# Patient Record
Sex: Female | Born: 1945
Health system: Southern US, Community
[De-identification: ages and names within clinical notes are randomized; demographics above are authoritative.]

## PROBLEM LIST (undated history)

## (undated) DIAGNOSIS — I10 Essential (primary) hypertension: Secondary | ICD-10-CM

## (undated) DIAGNOSIS — U071 COVID-19: Secondary | ICD-10-CM

## (undated) DIAGNOSIS — N6320 Unspecified lump in the left breast, unspecified quadrant: Secondary | ICD-10-CM

## (undated) DIAGNOSIS — K219 Gastro-esophageal reflux disease without esophagitis: Secondary | ICD-10-CM

## (undated) DIAGNOSIS — R000 Tachycardia, unspecified: Secondary | ICD-10-CM

## (undated) DIAGNOSIS — G43009 Migraine without aura, not intractable, without status migrainosus: Secondary | ICD-10-CM

## (undated) DIAGNOSIS — F419 Anxiety disorder, unspecified: Secondary | ICD-10-CM

## (undated) DIAGNOSIS — A080 Rotaviral enteritis: Secondary | ICD-10-CM

## (undated) DIAGNOSIS — K831 Obstruction of bile duct: Secondary | ICD-10-CM

## (undated) DIAGNOSIS — K8050 Calculus of bile duct without cholangitis or cholecystitis without obstruction: Secondary | ICD-10-CM

## (undated) HISTORY — PX: TUBAL LIGATION: SHX77

## (undated) HISTORY — PX: SKIN BIOPSY: SHX1

## (undated) HISTORY — PX: PARTIAL HYSTERECTOMY: SHX80

## (undated) HISTORY — DX: COVID-19: U07.1

## (undated) HISTORY — PX: WRIST SURGERY: SHX841

## (undated) HISTORY — DX: Rotaviral enteritis: A08.0

## (undated) HISTORY — PX: BREAST LUMPECTOMY: SHX2

## (undated) HISTORY — DX: Calculus of bile duct without cholangitis or cholecystitis without obstruction: K80.50

## (undated) HISTORY — PX: BLADDER SURGERY: SHX569

## (undated) HISTORY — DX: Migraine without aura, not intractable, without status migrainosus: G43.009

## (undated) HISTORY — PX: CHOLECYSTECTOMY: SHX55

## (undated) HISTORY — DX: Gastro-esophageal reflux disease without esophagitis: K21.9

## (undated) HISTORY — PX: COLONOSCOPY: SHX174

## (undated) HISTORY — DX: Obstruction of bile duct: K83.1

## (undated) HISTORY — PX: CYSTOSCOPY: SUR368

## (undated) HISTORY — PX: FOOT SURGERY: SHX648

## (undated) HISTORY — PX: ABDOMINAL HYSTERECTOMY: SHX81

## (undated) HISTORY — PX: VEIN LIGATION AND STRIPPING: SHX2653

---

## 1995-02-13 DIAGNOSIS — F32A Depression, unspecified: Secondary | ICD-10-CM

## 1995-02-13 HISTORY — DX: Depression, unspecified: F32.A

## 1998-07-21 ENCOUNTER — Encounter: Payer: Self-pay | Admitting: Gastroenterology

## 1998-07-22 ENCOUNTER — Inpatient Hospital Stay (HOSPITAL_COMMUNITY): Admission: RE | Admit: 1998-07-22 | Discharge: 1998-07-23 | Payer: Self-pay | Admitting: Gastroenterology

## 1998-08-18 ENCOUNTER — Emergency Department (HOSPITAL_COMMUNITY): Admission: EM | Admit: 1998-08-18 | Discharge: 1998-08-18 | Payer: Self-pay | Admitting: Emergency Medicine

## 1998-08-18 ENCOUNTER — Encounter: Payer: Self-pay | Admitting: Emergency Medicine

## 1999-04-11 ENCOUNTER — Encounter (INDEPENDENT_AMBULATORY_CARE_PROVIDER_SITE_OTHER): Payer: Self-pay | Admitting: Specialist

## 1999-04-11 ENCOUNTER — Inpatient Hospital Stay (HOSPITAL_COMMUNITY): Admission: RE | Admit: 1999-04-11 | Discharge: 1999-04-13 | Payer: Self-pay | Admitting: Obstetrics and Gynecology

## 1999-06-05 ENCOUNTER — Encounter: Payer: Self-pay | Admitting: Internal Medicine

## 1999-06-05 ENCOUNTER — Ambulatory Visit (HOSPITAL_COMMUNITY): Admission: RE | Admit: 1999-06-05 | Discharge: 1999-06-05 | Payer: Self-pay | Admitting: Internal Medicine

## 1999-12-19 ENCOUNTER — Other Ambulatory Visit: Admission: RE | Admit: 1999-12-19 | Discharge: 1999-12-19 | Payer: Self-pay | Admitting: Obstetrics and Gynecology

## 2000-04-15 ENCOUNTER — Encounter: Payer: Self-pay | Admitting: Family Medicine

## 2000-04-15 ENCOUNTER — Encounter: Admission: RE | Admit: 2000-04-15 | Discharge: 2000-04-15 | Payer: Self-pay | Admitting: Family Medicine

## 2000-04-16 ENCOUNTER — Inpatient Hospital Stay (HOSPITAL_COMMUNITY): Admission: EM | Admit: 2000-04-16 | Discharge: 2000-04-17 | Payer: Self-pay | Admitting: Internal Medicine

## 2000-04-16 ENCOUNTER — Encounter: Payer: Self-pay | Admitting: Internal Medicine

## 2000-12-29 ENCOUNTER — Encounter: Payer: Self-pay | Admitting: Critical Care Medicine

## 2000-12-29 ENCOUNTER — Ambulatory Visit (HOSPITAL_COMMUNITY): Admission: RE | Admit: 2000-12-29 | Discharge: 2000-12-29 | Payer: Self-pay | Admitting: Critical Care Medicine

## 2001-06-29 ENCOUNTER — Other Ambulatory Visit: Admission: RE | Admit: 2001-06-29 | Discharge: 2001-06-29 | Payer: Self-pay | Admitting: Obstetrics and Gynecology

## 2001-09-04 ENCOUNTER — Ambulatory Visit (HOSPITAL_COMMUNITY): Admission: RE | Admit: 2001-09-04 | Discharge: 2001-09-04 | Payer: Self-pay | Admitting: Internal Medicine

## 2001-09-16 HISTORY — PX: BREAST EXCISIONAL BIOPSY: SUR124

## 2001-10-19 ENCOUNTER — Encounter: Admission: RE | Admit: 2001-10-19 | Discharge: 2001-10-19 | Payer: Self-pay | Admitting: Occupational Medicine

## 2001-10-19 ENCOUNTER — Encounter: Payer: Self-pay | Admitting: Occupational Medicine

## 2002-03-21 ENCOUNTER — Emergency Department (HOSPITAL_COMMUNITY): Admission: EM | Admit: 2002-03-21 | Discharge: 2002-03-21 | Payer: Self-pay | Admitting: Emergency Medicine

## 2002-03-25 ENCOUNTER — Inpatient Hospital Stay (HOSPITAL_COMMUNITY): Admission: EM | Admit: 2002-03-25 | Discharge: 2002-03-27 | Payer: Self-pay | Admitting: Internal Medicine

## 2002-03-26 ENCOUNTER — Encounter: Payer: Self-pay | Admitting: Internal Medicine

## 2002-05-03 ENCOUNTER — Encounter (INDEPENDENT_AMBULATORY_CARE_PROVIDER_SITE_OTHER): Payer: Self-pay | Admitting: *Deleted

## 2002-05-03 ENCOUNTER — Encounter: Payer: Self-pay | Admitting: Surgery

## 2002-05-03 ENCOUNTER — Encounter: Admission: RE | Admit: 2002-05-03 | Discharge: 2002-05-03 | Payer: Self-pay | Admitting: Surgery

## 2002-05-09 ENCOUNTER — Emergency Department (HOSPITAL_COMMUNITY): Admission: EM | Admit: 2002-05-09 | Discharge: 2002-05-10 | Payer: Self-pay | Admitting: Emergency Medicine

## 2002-05-10 ENCOUNTER — Encounter: Payer: Self-pay | Admitting: Emergency Medicine

## 2002-05-11 ENCOUNTER — Encounter (INDEPENDENT_AMBULATORY_CARE_PROVIDER_SITE_OTHER): Payer: Self-pay | Admitting: *Deleted

## 2002-05-11 ENCOUNTER — Encounter: Admission: RE | Admit: 2002-05-11 | Discharge: 2002-05-11 | Payer: Self-pay | Admitting: Surgery

## 2002-05-11 ENCOUNTER — Encounter: Payer: Self-pay | Admitting: Surgery

## 2002-05-11 ENCOUNTER — Ambulatory Visit (HOSPITAL_BASED_OUTPATIENT_CLINIC_OR_DEPARTMENT_OTHER): Admission: RE | Admit: 2002-05-11 | Discharge: 2002-05-11 | Payer: Self-pay | Admitting: Surgery

## 2002-09-20 ENCOUNTER — Other Ambulatory Visit: Admission: RE | Admit: 2002-09-20 | Discharge: 2002-09-20 | Payer: Self-pay | Admitting: Obstetrics and Gynecology

## 2002-11-30 ENCOUNTER — Ambulatory Visit (HOSPITAL_COMMUNITY): Admission: RE | Admit: 2002-11-30 | Discharge: 2002-11-30 | Payer: Self-pay | Admitting: Internal Medicine

## 2002-11-30 ENCOUNTER — Encounter: Payer: Self-pay | Admitting: Internal Medicine

## 2002-12-29 ENCOUNTER — Encounter: Payer: Self-pay | Admitting: Surgery

## 2002-12-29 ENCOUNTER — Encounter: Admission: RE | Admit: 2002-12-29 | Discharge: 2002-12-29 | Payer: Self-pay | Admitting: Surgery

## 2003-04-28 ENCOUNTER — Encounter: Payer: Self-pay | Admitting: Obstetrics and Gynecology

## 2003-04-28 ENCOUNTER — Encounter: Admission: RE | Admit: 2003-04-28 | Discharge: 2003-04-28 | Payer: Self-pay | Admitting: Obstetrics and Gynecology

## 2003-07-17 ENCOUNTER — Inpatient Hospital Stay (HOSPITAL_COMMUNITY): Admission: EM | Admit: 2003-07-17 | Discharge: 2003-07-21 | Payer: Self-pay | Admitting: Emergency Medicine

## 2003-10-14 ENCOUNTER — Other Ambulatory Visit: Admission: RE | Admit: 2003-10-14 | Discharge: 2003-10-14 | Payer: Self-pay | Admitting: Obstetrics and Gynecology

## 2003-12-15 ENCOUNTER — Ambulatory Visit (HOSPITAL_COMMUNITY): Admission: RE | Admit: 2003-12-15 | Discharge: 2003-12-15 | Payer: Self-pay | Admitting: Gastroenterology

## 2004-06-13 ENCOUNTER — Encounter: Admission: RE | Admit: 2004-06-13 | Discharge: 2004-06-13 | Payer: Self-pay | Admitting: Family Medicine

## 2004-06-13 ENCOUNTER — Ambulatory Visit (HOSPITAL_COMMUNITY): Admission: RE | Admit: 2004-06-13 | Discharge: 2004-06-13 | Payer: Self-pay | Admitting: Gastroenterology

## 2004-06-18 ENCOUNTER — Encounter: Admission: RE | Admit: 2004-06-18 | Discharge: 2004-06-18 | Payer: Self-pay | Admitting: Family Medicine

## 2004-07-20 ENCOUNTER — Ambulatory Visit: Payer: Self-pay | Admitting: Cardiovascular Disease

## 2004-08-02 ENCOUNTER — Ambulatory Visit: Payer: Self-pay | Admitting: Family Medicine

## 2004-09-06 ENCOUNTER — Ambulatory Visit: Payer: Self-pay | Admitting: Family Medicine

## 2004-10-04 ENCOUNTER — Ambulatory Visit: Payer: Self-pay

## 2004-10-05 ENCOUNTER — Ambulatory Visit: Payer: Self-pay | Admitting: Family Medicine

## 2004-10-24 ENCOUNTER — Other Ambulatory Visit: Admission: RE | Admit: 2004-10-24 | Discharge: 2004-10-24 | Payer: Self-pay | Admitting: Obstetrics and Gynecology

## 2004-11-02 ENCOUNTER — Ambulatory Visit: Payer: Self-pay | Admitting: Family Medicine

## 2004-12-25 ENCOUNTER — Ambulatory Visit: Payer: Self-pay | Admitting: Family Medicine

## 2005-01-02 ENCOUNTER — Ambulatory Visit: Payer: Self-pay | Admitting: Gastroenterology

## 2005-01-03 ENCOUNTER — Ambulatory Visit (HOSPITAL_COMMUNITY): Admission: RE | Admit: 2005-01-03 | Discharge: 2005-01-03 | Payer: Self-pay | Admitting: Gastroenterology

## 2005-01-04 ENCOUNTER — Ambulatory Visit: Payer: Self-pay | Admitting: Gastroenterology

## 2005-03-14 ENCOUNTER — Ambulatory Visit: Payer: Self-pay | Admitting: Family Medicine

## 2005-03-18 ENCOUNTER — Ambulatory Visit: Payer: Self-pay | Admitting: Family Medicine

## 2005-06-03 ENCOUNTER — Ambulatory Visit: Payer: Self-pay | Admitting: Gastroenterology

## 2005-06-04 ENCOUNTER — Ambulatory Visit: Payer: Self-pay | Admitting: Gastroenterology

## 2005-06-20 ENCOUNTER — Ambulatory Visit: Payer: Self-pay | Admitting: Gastroenterology

## 2005-06-27 ENCOUNTER — Ambulatory Visit: Payer: Self-pay | Admitting: Gastroenterology

## 2005-06-27 ENCOUNTER — Encounter: Admission: RE | Admit: 2005-06-27 | Discharge: 2005-06-27 | Payer: Self-pay | Admitting: Family Medicine

## 2005-07-15 ENCOUNTER — Ambulatory Visit: Payer: Self-pay | Admitting: Pulmonary Disease

## 2005-07-22 ENCOUNTER — Ambulatory Visit (HOSPITAL_COMMUNITY): Admission: RE | Admit: 2005-07-22 | Discharge: 2005-07-22 | Payer: Self-pay | Admitting: Obstetrics and Gynecology

## 2005-07-25 ENCOUNTER — Ambulatory Visit: Payer: Self-pay | Admitting: Gastroenterology

## 2005-08-02 ENCOUNTER — Ambulatory Visit: Payer: Self-pay | Admitting: Family Medicine

## 2005-09-03 ENCOUNTER — Ambulatory Visit: Payer: Self-pay | Admitting: Family Medicine

## 2005-10-09 ENCOUNTER — Ambulatory Visit: Payer: Self-pay | Admitting: Family Medicine

## 2005-11-14 ENCOUNTER — Ambulatory Visit: Payer: Self-pay | Admitting: Family Medicine

## 2005-11-29 ENCOUNTER — Ambulatory Visit: Payer: Self-pay | Admitting: Family Medicine

## 2005-12-31 ENCOUNTER — Ambulatory Visit: Payer: Self-pay | Admitting: Gastroenterology

## 2006-01-01 ENCOUNTER — Ambulatory Visit: Payer: Self-pay | Admitting: Internal Medicine

## 2006-01-02 ENCOUNTER — Ambulatory Visit (HOSPITAL_COMMUNITY): Admission: RE | Admit: 2006-01-02 | Discharge: 2006-01-02 | Payer: Self-pay | Admitting: Gastroenterology

## 2006-01-03 ENCOUNTER — Ambulatory Visit: Payer: Self-pay | Admitting: Gastroenterology

## 2006-01-10 ENCOUNTER — Ambulatory Visit: Payer: Self-pay | Admitting: Gastroenterology

## 2006-01-10 LAB — HM COLONOSCOPY: HM Colonoscopy: NORMAL

## 2006-02-19 ENCOUNTER — Ambulatory Visit: Payer: Self-pay | Admitting: Internal Medicine

## 2006-02-19 ENCOUNTER — Observation Stay (HOSPITAL_COMMUNITY): Admission: EM | Admit: 2006-02-19 | Discharge: 2006-02-20 | Payer: Self-pay | Admitting: Emergency Medicine

## 2006-02-20 ENCOUNTER — Ambulatory Visit: Payer: Self-pay

## 2006-03-06 ENCOUNTER — Ambulatory Visit: Payer: Self-pay | Admitting: Cardiovascular Disease

## 2006-04-02 ENCOUNTER — Ambulatory Visit: Payer: Self-pay | Admitting: Gastroenterology

## 2006-04-02 ENCOUNTER — Ambulatory Visit (HOSPITAL_COMMUNITY): Admission: RE | Admit: 2006-04-02 | Discharge: 2006-04-03 | Payer: Self-pay | Admitting: Gastroenterology

## 2006-04-03 ENCOUNTER — Ambulatory Visit: Payer: Self-pay | Admitting: Gastroenterology

## 2006-05-06 ENCOUNTER — Ambulatory Visit: Payer: Self-pay | Admitting: Gastroenterology

## 2006-05-22 ENCOUNTER — Ambulatory Visit: Payer: Self-pay | Admitting: Cardiology

## 2006-06-09 ENCOUNTER — Ambulatory Visit: Payer: Self-pay | Admitting: Gastroenterology

## 2006-06-10 ENCOUNTER — Ambulatory Visit (HOSPITAL_COMMUNITY): Admission: RE | Admit: 2006-06-10 | Discharge: 2006-06-10 | Payer: Self-pay | Admitting: Internal Medicine

## 2006-06-18 ENCOUNTER — Ambulatory Visit: Payer: Self-pay | Admitting: Gastroenterology

## 2006-06-30 ENCOUNTER — Encounter: Admission: RE | Admit: 2006-06-30 | Discharge: 2006-06-30 | Payer: Self-pay | Admitting: Family Medicine

## 2006-07-02 ENCOUNTER — Ambulatory Visit: Payer: Self-pay | Admitting: Family Medicine

## 2006-07-14 ENCOUNTER — Encounter: Admission: RE | Admit: 2006-07-14 | Discharge: 2006-07-14 | Payer: Self-pay | Admitting: Family Medicine

## 2006-09-19 ENCOUNTER — Ambulatory Visit: Payer: Self-pay | Admitting: Family Medicine

## 2006-10-07 ENCOUNTER — Ambulatory Visit: Payer: Self-pay | Admitting: Family Medicine

## 2006-10-29 ENCOUNTER — Ambulatory Visit: Payer: Self-pay | Admitting: Cardiovascular Disease

## 2006-12-17 ENCOUNTER — Ambulatory Visit: Payer: Self-pay | Admitting: Gastroenterology

## 2006-12-17 ENCOUNTER — Ambulatory Visit: Payer: Self-pay | Admitting: Cardiovascular Disease

## 2006-12-17 LAB — CONVERTED CEMR LAB
ALT: 21 units/L (ref 0–40)
Bilirubin, Direct: 0.1 mg/dL (ref 0.0–0.3)
Cholesterol: 216 mg/dL (ref 0–200)
Direct LDL: 134.5 mg/dL
Total CHOL/HDL Ratio: 3.8
Total Protein: 7.3 g/dL (ref 6.0–8.3)
Triglycerides: 201 mg/dL (ref 0–149)
VLDL: 40 mg/dL (ref 0–40)

## 2006-12-18 ENCOUNTER — Ambulatory Visit (HOSPITAL_COMMUNITY): Admission: RE | Admit: 2006-12-18 | Discharge: 2006-12-18 | Payer: Self-pay | Admitting: Gastroenterology

## 2006-12-22 ENCOUNTER — Ambulatory Visit: Payer: Self-pay | Admitting: Cardiology

## 2006-12-22 ENCOUNTER — Ambulatory Visit: Payer: Self-pay | Admitting: Gastroenterology

## 2007-03-13 ENCOUNTER — Ambulatory Visit: Payer: Self-pay | Admitting: Family Medicine

## 2007-03-13 DIAGNOSIS — D510 Vitamin B12 deficiency anemia due to intrinsic factor deficiency: Secondary | ICD-10-CM | POA: Insufficient documentation

## 2007-03-13 DIAGNOSIS — E782 Mixed hyperlipidemia: Secondary | ICD-10-CM | POA: Insufficient documentation

## 2007-03-13 HISTORY — DX: Vitamin B12 deficiency anemia due to intrinsic factor deficiency: D51.0

## 2007-03-13 LAB — CONVERTED CEMR LAB
AST: 20 units/L (ref 0–37)
Bilirubin, Direct: 0.1 mg/dL (ref 0.0–0.3)
Cholesterol: 239 mg/dL (ref 0–200)
Direct LDL: 132.2 mg/dL
HDL: 48.7 mg/dL (ref >39.0)
Total Bilirubin: 0.9 mg/dL (ref 0.3–1.2)
Total Protein: 6.9 g/dL (ref 6.0–8.3)
Triglycerides: 275 mg/dL (ref 0–149)

## 2007-03-31 ENCOUNTER — Telehealth (INDEPENDENT_AMBULATORY_CARE_PROVIDER_SITE_OTHER): Payer: Self-pay | Admitting: *Deleted

## 2007-04-02 ENCOUNTER — Ambulatory Visit: Payer: Self-pay | Admitting: Cardiology

## 2007-04-08 ENCOUNTER — Ambulatory Visit (HOSPITAL_COMMUNITY): Admission: RE | Admit: 2007-04-08 | Discharge: 2007-04-08 | Payer: Self-pay | Admitting: Gastroenterology

## 2007-04-14 ENCOUNTER — Ambulatory Visit: Payer: Self-pay | Admitting: Gastroenterology

## 2007-07-10 ENCOUNTER — Ambulatory Visit: Payer: Self-pay | Admitting: Family Medicine

## 2007-07-22 ENCOUNTER — Ambulatory Visit: Payer: Self-pay | Admitting: Family Medicine

## 2007-07-22 LAB — CONVERTED CEMR LAB
ALT: 20 units/L (ref 0–35)
AST: 17 units/L (ref 0–37)
Albumin: 3.8 g/dL (ref 3.5–5.2)
Alkaline Phosphatase: 68 units/L (ref 39–117)
Cholesterol: 195 mg/dL (ref 0–200)
Total Bilirubin: 0.8 mg/dL (ref 0.3–1.2)
Total CHOL/HDL Ratio: 4
Total Protein: 6.6 g/dL (ref 6.0–8.3)

## 2007-07-27 ENCOUNTER — Ambulatory Visit: Payer: Self-pay | Admitting: Internal Medicine

## 2007-07-28 ENCOUNTER — Encounter: Admission: RE | Admit: 2007-07-28 | Discharge: 2007-07-28 | Payer: Self-pay | Admitting: Family Medicine

## 2007-07-30 ENCOUNTER — Encounter (INDEPENDENT_AMBULATORY_CARE_PROVIDER_SITE_OTHER): Payer: Self-pay | Admitting: *Deleted

## 2007-09-16 ENCOUNTER — Ambulatory Visit: Payer: Self-pay | Admitting: Family Medicine

## 2007-10-27 ENCOUNTER — Ambulatory Visit: Payer: Self-pay | Admitting: Cardiovascular Disease

## 2007-12-01 ENCOUNTER — Telehealth: Payer: Self-pay | Admitting: Family Medicine

## 2007-12-02 ENCOUNTER — Ambulatory Visit: Payer: Self-pay | Admitting: Family Medicine

## 2007-12-16 ENCOUNTER — Ambulatory Visit: Payer: Self-pay | Admitting: Gastroenterology

## 2007-12-16 LAB — CONVERTED CEMR LAB
AST: 19 units/L (ref 0–37)
Alkaline Phosphatase: 66 units/L (ref 39–117)
BUN: 15 mg/dL (ref 6–23)
Basophils Absolute: 0 10*3/uL (ref 0.0–0.1)
Basophils Relative: 0.8 % (ref 0.0–1.0)
Bilirubin, Direct: 0.1 mg/dL (ref 0.0–0.3)
Creatinine, Ser: 0.9 mg/dL (ref 0.4–1.2)
Eosinophils Absolute: 0.2 10*3/uL (ref 0.0–0.7)
Eosinophils Relative: 3.8 % (ref 0.0–5.0)
HCT: 39.7 % (ref 36.0–46.0)
Hemoglobin, Urine: NEGATIVE
MCHC: 33.6 g/dL (ref 30.0–36.0)
MCV: 91 fL (ref 78.0–100.0)
Monocytes Absolute: 0.4 10*3/uL (ref 0.1–1.0)
Mucus, UA: NEGATIVE
Nitrite: NEGATIVE
Platelets: 255 10*3/uL (ref 150–400)
Total Bilirubin: 0.7 mg/dL (ref 0.3–1.2)
Urine Glucose: NEGATIVE mg/dL
WBC: 4.4 10*3/uL — ABNORMAL LOW (ref 4.5–10.5)

## 2007-12-23 ENCOUNTER — Ambulatory Visit (HOSPITAL_COMMUNITY): Admission: RE | Admit: 2007-12-23 | Discharge: 2007-12-23 | Payer: Self-pay | Admitting: Gastroenterology

## 2007-12-31 ENCOUNTER — Ambulatory Visit: Payer: Self-pay | Admitting: Gastroenterology

## 2008-01-20 ENCOUNTER — Ambulatory Visit: Payer: Self-pay | Admitting: Family Medicine

## 2008-01-20 LAB — CONVERTED CEMR LAB
Bilirubin Urine: NEGATIVE
Glucose, Urine, Semiquant: NEGATIVE
Ketones, urine, test strip: NEGATIVE
Nitrite: NEGATIVE
Urobilinogen, UA: 0.2

## 2008-01-21 ENCOUNTER — Encounter: Payer: Self-pay | Admitting: Family Medicine

## 2008-02-01 ENCOUNTER — Ambulatory Visit: Payer: Self-pay | Admitting: Cardiology

## 2008-02-05 ENCOUNTER — Ambulatory Visit: Payer: Self-pay | Admitting: Cardiology

## 2008-02-05 ENCOUNTER — Encounter: Payer: Self-pay | Admitting: Family Medicine

## 2008-02-09 LAB — CONVERTED CEMR LAB
HDL: 39 mg/dL (ref >39.0)
LDL Cholesterol: 97 mg/dL (ref 0–99)
VLDL: 32 mg/dL (ref 0–40)
Vit D, 1,25-Dihydroxy: 30 (ref 30–89)
Vit D, 1,25-Dihydroxy: 76 (ref 30–89)

## 2008-04-08 ENCOUNTER — Ambulatory Visit: Payer: Self-pay | Admitting: Family Medicine

## 2008-04-26 ENCOUNTER — Telehealth: Payer: Self-pay | Admitting: Gastroenterology

## 2008-04-26 DIAGNOSIS — K831 Obstruction of bile duct: Secondary | ICD-10-CM | POA: Insufficient documentation

## 2008-04-26 DIAGNOSIS — K8050 Calculus of bile duct without cholangitis or cholecystitis without obstruction: Secondary | ICD-10-CM | POA: Insufficient documentation

## 2008-04-26 HISTORY — DX: Obstruction of bile duct: K83.1

## 2008-04-26 HISTORY — DX: Calculus of bile duct without cholangitis or cholecystitis without obstruction: K80.50

## 2008-04-27 ENCOUNTER — Ambulatory Visit (HOSPITAL_COMMUNITY): Admission: RE | Admit: 2008-04-27 | Discharge: 2008-04-27 | Payer: Self-pay | Admitting: Gastroenterology

## 2008-04-27 ENCOUNTER — Ambulatory Visit: Payer: Self-pay | Admitting: Gastroenterology

## 2008-04-28 ENCOUNTER — Encounter: Payer: Self-pay | Admitting: Gastroenterology

## 2008-05-03 ENCOUNTER — Telehealth: Payer: Self-pay | Admitting: Family Medicine

## 2008-05-04 ENCOUNTER — Telehealth: Payer: Self-pay | Admitting: Family Medicine

## 2008-05-05 ENCOUNTER — Telehealth: Payer: Self-pay | Admitting: Gastroenterology

## 2008-05-11 ENCOUNTER — Encounter: Payer: Self-pay | Admitting: Gastroenterology

## 2008-05-11 DIAGNOSIS — K219 Gastro-esophageal reflux disease without esophagitis: Secondary | ICD-10-CM | POA: Insufficient documentation

## 2008-05-13 ENCOUNTER — Encounter: Payer: Self-pay | Admitting: Gastroenterology

## 2008-05-13 ENCOUNTER — Ambulatory Visit: Payer: Self-pay | Admitting: Family Medicine

## 2008-05-16 LAB — CONVERTED CEMR LAB
AST: 17 units/L (ref 0–37)
Albumin: 3.8 g/dL (ref 3.5–5.2)
Alkaline Phosphatase: 66 units/L (ref 39–117)
Bilirubin, Direct: 0.1 mg/dL (ref 0.0–0.3)
Cholesterol: 282 mg/dL (ref 0–200)
Total CHOL/HDL Ratio: 5.9
Total Protein: 6.7 g/dL (ref 6.0–8.3)
VLDL: 31 mg/dL (ref 0–40)

## 2008-05-20 ENCOUNTER — Telehealth: Payer: Self-pay | Admitting: Gastroenterology

## 2008-05-27 ENCOUNTER — Encounter: Payer: Self-pay | Admitting: Gastroenterology

## 2008-08-15 ENCOUNTER — Encounter: Admission: RE | Admit: 2008-08-15 | Discharge: 2008-08-15 | Payer: Self-pay | Admitting: Obstetrics and Gynecology

## 2008-08-22 ENCOUNTER — Ambulatory Visit: Payer: Self-pay | Admitting: Gastroenterology

## 2008-08-22 ENCOUNTER — Telehealth: Payer: Self-pay | Admitting: Gastroenterology

## 2008-08-22 LAB — CONVERTED CEMR LAB
Ketones, ur: NEGATIVE mg/dL
Mucus, UA: NEGATIVE
RBC / HPF: NONE SEEN
Specific Gravity, Urine: 1.01 (ref 1.000–1.03)
Total Protein, Urine: NEGATIVE mg/dL
Urine Glucose: NEGATIVE mg/dL

## 2008-08-24 ENCOUNTER — Ambulatory Visit (HOSPITAL_COMMUNITY): Admission: RE | Admit: 2008-08-24 | Discharge: 2008-08-24 | Payer: Self-pay | Admitting: Gastroenterology

## 2008-08-24 ENCOUNTER — Ambulatory Visit: Payer: Self-pay | Admitting: Gastroenterology

## 2008-12-08 ENCOUNTER — Ambulatory Visit: Payer: Self-pay | Admitting: Family Medicine

## 2008-12-08 LAB — CONVERTED CEMR LAB
ALT: 16 units/L (ref 0–35)
AST: 18 units/L (ref 0–37)
Albumin: 3.9 g/dL (ref 3.5–5.2)
Alkaline Phosphatase: 74 units/L (ref 39–117)
BUN: 17 mg/dL (ref 6–23)
CO2: 29 meq/L (ref 19–32)
Chloride: 104 meq/L (ref 96–112)
Cholesterol: 253 mg/dL — ABNORMAL HIGH (ref 0–200)
GFR calc non Af Amer: 67.54 mL/min (ref >60)
Glucose, Bld: 115 mg/dL — ABNORMAL HIGH (ref 70–99)
Potassium: 4.2 meq/L (ref 3.5–5.1)
Sodium: 139 meq/L (ref 135–145)
Total Protein: 7 g/dL (ref 6.0–8.3)
VLDL: 31.8 mg/dL (ref 0.0–40.0)

## 2008-12-30 ENCOUNTER — Telehealth (INDEPENDENT_AMBULATORY_CARE_PROVIDER_SITE_OTHER): Payer: Self-pay | Admitting: Internal Medicine

## 2009-01-04 ENCOUNTER — Ambulatory Visit: Payer: Self-pay | Admitting: Family Medicine

## 2009-01-04 LAB — CONVERTED CEMR LAB
Chloride: 111 meq/L (ref 96–112)
GFR calc non Af Amer: 67.53 mL/min (ref >60)
Glucose, Bld: 93 mg/dL (ref 70–99)
Potassium: 5 meq/L (ref 3.5–5.1)
Sodium: 144 meq/L (ref 135–145)

## 2009-01-09 ENCOUNTER — Ambulatory Visit: Payer: Self-pay | Admitting: Family Medicine

## 2009-02-16 ENCOUNTER — Ambulatory Visit: Payer: Self-pay | Admitting: Family Medicine

## 2009-02-27 ENCOUNTER — Telehealth: Payer: Self-pay | Admitting: Family Medicine

## 2009-05-19 ENCOUNTER — Ambulatory Visit: Payer: Self-pay | Admitting: Family Medicine

## 2009-06-06 ENCOUNTER — Telehealth: Payer: Self-pay | Admitting: Gastroenterology

## 2009-06-13 ENCOUNTER — Encounter: Payer: Self-pay | Admitting: Gastroenterology

## 2009-06-13 ENCOUNTER — Telehealth: Payer: Self-pay | Admitting: Family Medicine

## 2009-08-28 ENCOUNTER — Encounter: Admission: RE | Admit: 2009-08-28 | Discharge: 2009-08-28 | Payer: Self-pay | Admitting: Obstetrics and Gynecology

## 2009-09-13 ENCOUNTER — Ambulatory Visit: Payer: Self-pay | Admitting: Family Medicine

## 2009-11-17 ENCOUNTER — Ambulatory Visit: Payer: Self-pay | Admitting: Family Medicine

## 2010-01-04 ENCOUNTER — Telehealth: Payer: Self-pay | Admitting: Family Medicine

## 2010-02-09 ENCOUNTER — Ambulatory Visit: Payer: Self-pay | Admitting: Family Medicine

## 2010-02-14 ENCOUNTER — Encounter: Payer: Self-pay | Admitting: Family Medicine

## 2010-02-21 ENCOUNTER — Ambulatory Visit: Payer: Self-pay | Admitting: Family Medicine

## 2010-02-21 LAB — CONVERTED CEMR LAB
ALT: 17 units/L (ref 0–35)
AST: 16 units/L (ref 0–37)
Amylase: 46 units/L (ref 27–131)
BUN: 14 mg/dL (ref 6–23)
Basophils Absolute: 0 10*3/uL (ref 0.0–0.1)
CO2: 28 meq/L (ref 19–32)
Calcium: 9 mg/dL (ref 8.4–10.5)
Creatinine, Ser: 0.8 mg/dL (ref 0.4–1.2)
Direct LDL: 123.1 mg/dL
Eosinophils Relative: 4 % (ref 0.0–5.0)
HCT: 37 % (ref 36.0–46.0)
Hemoglobin: 13.1 g/dL (ref 12.0–15.0)
Lymphs Abs: 1 10*3/uL (ref 0.7–4.0)
MCV: 92.6 fL (ref 78.0–100.0)
Monocytes Absolute: 0.4 10*3/uL (ref 0.1–1.0)
Monocytes Relative: 8.5 % (ref 3.0–12.0)
Neutro Abs: 2.7 10*3/uL (ref 1.4–7.7)
RDW: 12.5 % (ref 11.5–14.6)
TSH: 2.5 microintl units/mL (ref 0.35–5.50)

## 2010-03-23 ENCOUNTER — Ambulatory Visit: Payer: Self-pay | Admitting: Family Medicine

## 2010-04-03 ENCOUNTER — Telehealth: Payer: Self-pay | Admitting: Gastroenterology

## 2010-04-06 ENCOUNTER — Ambulatory Visit: Payer: Self-pay | Admitting: Gastroenterology

## 2010-04-06 ENCOUNTER — Ambulatory Visit (HOSPITAL_COMMUNITY): Admission: RE | Admit: 2010-04-06 | Discharge: 2010-04-06 | Payer: Self-pay | Admitting: Gastroenterology

## 2010-04-06 LAB — CONVERTED CEMR LAB
ALT: 22 units/L (ref 0–35)
AST: 30 units/L (ref 0–37)
Amylase: 40 units/L (ref 27–131)
Eosinophils Relative: 4.9 % (ref 0.0–5.0)
HCT: 36.4 % (ref 36.0–46.0)
Hemoglobin: 12.7 g/dL (ref 12.0–15.0)
Lymphs Abs: 1.2 10*3/uL (ref 0.7–4.0)
Monocytes Relative: 8.5 % (ref 3.0–12.0)
Neutro Abs: 2.2 10*3/uL (ref 1.4–7.7)
Platelets: 251 10*3/uL (ref 150.0–400.0)
RBC: 3.92 M/uL (ref 3.87–5.11)
Total Bilirubin: 0.5 mg/dL (ref 0.3–1.2)
WBC: 3.9 10*3/uL — ABNORMAL LOW (ref 4.5–10.5)

## 2010-04-13 ENCOUNTER — Ambulatory Visit: Payer: Self-pay | Admitting: Gastroenterology

## 2010-04-13 DIAGNOSIS — R1011 Right upper quadrant pain: Secondary | ICD-10-CM | POA: Insufficient documentation

## 2010-04-23 ENCOUNTER — Encounter (INDEPENDENT_AMBULATORY_CARE_PROVIDER_SITE_OTHER): Payer: Self-pay | Admitting: *Deleted

## 2010-05-03 ENCOUNTER — Ambulatory Visit: Payer: Self-pay | Admitting: Family Medicine

## 2010-06-08 ENCOUNTER — Ambulatory Visit: Payer: Self-pay | Admitting: Family Medicine

## 2010-06-19 ENCOUNTER — Telehealth: Payer: Self-pay | Admitting: Family Medicine

## 2010-06-19 ENCOUNTER — Encounter: Payer: Self-pay | Admitting: Family Medicine

## 2010-07-03 ENCOUNTER — Telehealth: Payer: Self-pay | Admitting: Family Medicine

## 2010-07-25 ENCOUNTER — Ambulatory Visit: Payer: Self-pay | Admitting: Family Medicine

## 2010-08-29 ENCOUNTER — Encounter
Admission: RE | Admit: 2010-08-29 | Discharge: 2010-08-29 | Payer: Self-pay | Source: Home / Self Care | Attending: Family Medicine | Admitting: Family Medicine

## 2010-08-30 ENCOUNTER — Ambulatory Visit: Payer: Self-pay | Admitting: Family Medicine

## 2010-10-05 ENCOUNTER — Telehealth (INDEPENDENT_AMBULATORY_CARE_PROVIDER_SITE_OTHER): Payer: Self-pay

## 2010-10-05 ENCOUNTER — Telehealth: Payer: Self-pay | Admitting: Gastroenterology

## 2010-10-07 ENCOUNTER — Encounter: Payer: Self-pay | Admitting: Pulmonary Disease

## 2010-10-07 ENCOUNTER — Encounter: Payer: Self-pay | Admitting: Gastroenterology

## 2010-10-08 ENCOUNTER — Telehealth: Payer: Self-pay | Admitting: Gastroenterology

## 2010-10-08 ENCOUNTER — Telehealth (INDEPENDENT_AMBULATORY_CARE_PROVIDER_SITE_OTHER): Payer: Self-pay

## 2010-10-10 ENCOUNTER — Ambulatory Visit (HOSPITAL_COMMUNITY)
Admission: RE | Admit: 2010-10-10 | Discharge: 2010-10-10 | Payer: Self-pay | Source: Home / Self Care | Attending: Gastroenterology | Admitting: Gastroenterology

## 2010-10-10 ENCOUNTER — Encounter: Payer: Self-pay | Admitting: Gastroenterology

## 2010-10-18 NOTE — Progress Notes (Signed)
Summary: Triage   Phone Note Call from Patient Call back at [phone]   Caller: Patient Call For: Dr. [name] Reason for Call: Talk to Nurse Summary of Call: Wants to know if Dr. [name] would order a ultrasound  Initial call taken by: Karna Christmas,  October 08, 2010 8:45 AM  Follow-up for Phone Call        Spoke with patient and she wants to have an ultrasound ordered. See other note to Dr. [name]. Follow-up by: Selinda Michaels RN,  October 08, 2010 9:16 AM

## 2010-10-18 NOTE — Letter (Signed)
Summary: Nadara Eaton letter  [place] [facility]  [address] [place], [place] [address]   Phone: [phone]  Fax: [phone]       04/23/2010 MRN: [id number]  [facility] [address] [place], [place]  [address]  Dear Ms. [facility] [place], and [place] announce the retirement of Arta Silence, M.D., from full-time practice at the [facility] office effective March 15, 2010 and his plans of returning part-time.  It is important to Dr. Hetty Ely and to our practice that you understand that [facility] has seven physicians in our office for your health care needs.  We will continue to offer the same exceptional care that you have today.    Dr. Hetty Ely has spoken to many of you about his plans for retirement and returning part-time in the fall.   We will continue to work with you through the transition to schedule appointments for you in the office and meet the high standards that [place] is committed to.   Again, it is with great pleasure that we share the news that Dr. Hetty Ely will return to [facility] in October of 2011 with a reduced schedule.    If you have any questions, or would like to request an appointment with one of our physicians, please call us at [phone] and press the option for Scheduling an appointment.  We take pleasure in providing you with excellent patient care and look forward to seeing you at your next office visit.  Our [facility] Physicians are:  Tillman Abide, M.D. [name], M.D. Roxy Manns, M.D. Kerby Nora, M.D. Hannah Beat, M.D. Ruthe Mannan, M.D. We proudly welcomed Raechel Ache, M.D. and Eustaquio Boyden, M.D. to the practice in July/August 2011.  Sincerely,  [facility]

## 2010-10-18 NOTE — Progress Notes (Signed)
Summary: TRIAGE-Ultrasound/Labs/REV   Phone Note Call from Patient Call back at Home Phone [phone]   Caller: Patient Call For: [name] Summary of Call: called the patient to schedule her GI recall  office visit. She complains of lower back pain recently which is about the same pain that she has last time she needed the ERCP, it start about a month ago. Patient is wanting to have an ultrasound before she  sees Dr. [name]. Initial call taken by: Harlow Mares CMA Duncan Dull),  April 03, 2010 12:08 PM  Follow-up for Phone Call        Pt. c/o intermittent back pain for 1 month, feels like it did when she had a stone. Denies fever,chills, diarrhea,constipation,n/v. "I just don't feel real good"  Pt. would like an Ultrasound scheduled and then come to see Dr.Novalyn Lajara. Pt. is aware Dr.Zyia Kaneko is out of the office until 04-10-10, she declines to see an extender. I have scheduled her to see Dr.Hopie Pellegrin on 04-13-10 at 9:30am. If symptoms become worse call back immediately or go to ER.  DR.Kazandra Forstrom--DOES SHE NEED LABS/ULTRASOUND PRIOR TO APPT WITH YOU?  Follow-up by: Laureen Ochs LPN,  April 03, 2010 2:11 PM  Additional Follow-up for Phone Call Additional follow up Details #1::        ok Additional Follow-up by: Louis Meckel MD,  April 04, 2010 8:13 AM    Additional Follow-up for Phone Call Additional follow up Details #2::    Pt. is scheduled for an Ultrasound at [facility] on 04-06-10 at 9am,NPO after 12mn. She will also have labs drawn at [facility] tomorrow, order is in IDX.  Message left for patient to callback. Laureen Ochs LPN  April 05, 2010 9:42 AM   Above MD orders reviewed with patient. Pt. instructed to call back as needed.  Follow-up by: Laureen Ochs LPN,  April 05, 2010 2:45 PM

## 2010-10-18 NOTE — Progress Notes (Signed)
Summary: Triage  Medications Added CIPRO 500 MG TABS (CIPROFLOXACIN HCL) Take 1 by mouth twice a day       Phone Note Call from Patient Call back at [phone]   Caller: Patient Call For: Dr. [name] Reason for Call: Talk to Nurse Summary of Call: pain under her breast and into her back. Initial call taken by: Karna Christmas,  October 05, 2010 2:52 PM  Follow-up for Phone Call        Patient states that she has had 2 episodes this week, one earlier this week that lasted . She had chills and pain under her breast. Patient states that today she had chills and the same type episode that lasted about an hour and a half. Patient states that sometimes she is given an antibiotic for this and sometimes she has to have an ercp. Dr. [name] please advise. Follow-up by: Selinda Michaels RN,  October 05, 2010 3:17 PM  Additional Follow-up for Phone Call Additional follow up Details #1::        begin cipro 500mg  two times a day x 7 days c/b Mon to report progress. If no better will need to call over the weekend (I will notify Dr. Marina Goodell) Additional Follow-up by: Louis Meckel MD,  October 05, 2010 3:33 PM    Additional Follow-up for Phone Call Additional follow up Details #2::    Left message for patient that prescription is being sent to [facility]. Informed patient of Dr. Marzetta Board recommendations. Follow-up by: Selinda Michaels RN,  October 05, 2010 3:59 PM  New/Updated Medications: CIPRO 500 MG TABS (CIPROFLOXACIN HCL) Take 1 by mouth twice a day Prescriptions: CIPRO 500 MG TABS (CIPROFLOXACIN HCL) Take 1 by mouth twice a day  #14 x 0   Entered by:   Selinda Michaels RN   Authorized by:   Louis Meckel MD   Signed by:   Selinda Michaels RN on 10/05/2010   Method used:   Electronically to        [facility]. [address] [phone]* (retail)       [address] [place], [place]  [address]       Ph: [phone]       Fax: [phone]   RxID:   [id number]

## 2010-10-18 NOTE — Assessment & Plan Note (Signed)
Summary: Katie [name] B12/RBH   Nurse Visit   Allergies: No Known Drug Allergies  Medication Administration  Injection # 1:    Medication: Vit B12 1000 mcg    Diagnosis: ANEMIA, PERNICIOUS (ICD-281.0)    Route: IM    Site: L deltoid    Exp Date: 04/15/2012    Lot #: [id number]    Mfr: [employer]    Patient tolerated injection without complications    Given by: Delilah Shan CMA Duncan Dull) (August 30, 2010 2:49 PM)  Orders Added: 1)  Admin of Therapeutic Inj  intramuscular or subcutaneous [96372] 2)  Vit B12 1000 mcg [J3420]   Medication Administration  Injection # 1:    Medication: Vit B12 1000 mcg    Diagnosis: ANEMIA, PERNICIOUS (ICD-281.0)    Route: IM    Site: L deltoid    Exp Date: 04/15/2012    Lot #: [id number]    Mfr: [employer]    Patient tolerated injection without complications    Given by: Delilah Shan CMA Duncan Dull) (August 30, 2010 2:49 PM)  Orders Added: 1)  Admin of Therapeutic Inj  intramuscular or subcutaneous [96372] 2)  Vit B12 1000 mcg [J3420]

## 2010-10-18 NOTE — Assessment & Plan Note (Signed)
Summary: NURSE,B-12/ARON/JRR   Nurse Visit   Allergies: No Known Drug Allergies  Medication Administration  Injection # 1:    Medication: Vit B12 1000 mcg    Diagnosis: ANEMIA, PERNICIOUS (ICD-281.0)    Route: IM    Site: R deltoid    Exp Date: 08/17/2011    Lot #: [id number]    Mfr: [employer]    Patient tolerated injection without complications    Given by: Lewanda Rife LPN (May 03, 2010 2:10 PM)  Orders Added: 1)  Vit B12 1000 mcg [J3420] 2)  Admin of Therapeutic Inj  intramuscular or subcutaneous [98119]

## 2010-10-18 NOTE — Medication Information (Signed)
Summary: Prior Authorization for Nexium/[employer]  Prior Authorization for Nexium/[employer]   Imported By: Lanelle Bal 07/03/2010 10:50:33  _____________________________________________________________________  External Attachment:    Type:   Image     Comment:   External Document

## 2010-10-18 NOTE — Assessment & Plan Note (Signed)
Summary: MED REFILL/LAB WORK?   Vital Signs:  Patient profile:   65 year old female Height:      63.5 inches Weight:      143.25 pounds BMI:     25.07 Temp:     98.5 degrees F oral Pulse rate:   60 / minute Pulse rhythm:   regular BP sitting:   120 / 74  (left arm) Cuff size:   regular  Vitals Entered By: Sydell Axon LPN (February 21, 453 9:30 AM) CC: Refill medications and ? lab work   History of Present Illness: Pt here for medication refill. She sees Gyn for routine followup and sees Dr Marcelle Overlie yearly, last Pap 2/10 and gets mammo regularly. She is being called back for Dexa but hasn't yet gone. She hasn't seen Dr [name] in a year. She has had some fleeting pains but it hasn't continued. She thinks she has passed some stones, bbut nothiung chronically painful.  Problems Prior to Update: 1)  Polyuria  (ICD-788.42) 2)  Gerd  (ICD-530.81) 3)  Choledocholithiasis  (ICD-574.50) 4)  Bile Duct Stricture  (ICD-576.2) 5)  Hyperlipidemia, Mixed  (ICD-272.2) 6)  Aftercare, Long-term Use, Medications Nec  (ICD-V58.69) 7)  Anemia, Pernicious  (ICD-281.0)  Medications Prior to Update: 1)  Nexium 40 Mg Cpdr (Esomeprazole Magnesium) .... One Tablet By Mouth Two Times A Day 2)  Effexor 37.5 Mg  Tabs (Venlafaxine Hcl) .... One Tab By Mouth Two Times A Day 3)  Propranolol Hcl 40 Mg Tabs (Propranolol Hcl) .Marland Kitchen.. 1 Tablet Twice A Day By Mouth 4)  Ursodiol 300 Mg Caps (Ursodiol) .... One Tab By Mouth Two Times A Day 5)  Simvastatin 80 Mg Tabs (Simvastatin) .... One Tab By Mouth At Night  Allergies: No Known Drug Allergies  Past History:  Past Medical History: Last updated: 12/16/2007 Recurrent choledocholithiasis Bile Duct Stricture  Past Surgical History: Last updated: 08/22/2008 Foot surgery both feet Vein Stripping Lt. Leg Partial Hysterectomy bladder surgery x 2 Tubal Ligation Rt. wrist surgery Rt. Breast Lumpectomy Cholecystectomy  Family History: Father 13-Dec-2062Choked to  death Etoh Mother 13-Dec-2055Lung Ca Smoker Sister A 65 (Gail0 Knee and catarract Surg  GERD Sister A 4 Britta Mccreedy) Sister A 109 Maralyn Sago) Depression Anxiety Htn Brother A 60 Molly Maduro) GERD Precanc mouth surgery  Social History: Married lives at home   Physical Exam  General:  Well-developed,well-nourished,in no acute distress; alert,appropriate and cooperative throughout examination, comfortable when seen. Head:  Normocephalic and atraumatic without obvious abnormalities. No apparent alopecia or balding. Sinuses mildy tender, max> frontals. Eyes:  Conjunctiva clear bilaterally.  Ears:  External ear exam shows no significant lesions or deformities.  Otoscopic examination reveals clear canals, tympanic membranes are intact bilaterally without bulging, retraction, inflammation or discharge. Hearing is grossly normal bilaterally. Nose:  External nasal examination shows no deformity but mild inflammation. Nasal mucosa are pink and moist without lesions or exudates, but mildly boggy. Mouth:  Oral mucosa and oropharynx without lesions or exudates.  Teeth in good repair. Neck:  No deformities, masses, or tenderness noted. Chest Wall:  No deformities, masses, or tenderness noted. Lungs:  Normal respiratory effort, chest expands symmetrically. Lungs are clear to auscultation, no crackles or wheezes. Heart:  Normal rate and regular rhythm. S1 and S2 normal without gallop, murmur, click, rub or other extra sounds. Msk:  No deformity or scoliosis noted of thoracic or lumbar spine.   Pulses:  R and L carotid,radial,femoral,dorsalis pedis and posterior tibial pulses are full and  equal bilaterally Extremities:  No clubbing, cyanosis, edema, or deformity noted with normal full range of motion of all joints.   Skin:  Intact without suspicious lesions or rashes Cervical Nodes:  No lymphadenopathy noted Inguinal Nodes:  No significant adenopathy Psych:  Cognition and judgment appear intact. Alert and cooperative  with normal attention span and concentration. No apparent delusions, illusions, hallucinations   Impression & Recommendations:  Problem # 1:  GERD (ICD-530.81) Assessment Unchanged Stable and well comtolled. Her updated medication list for this problem includes:    Nexium 40 Mg Cpdr (Esomeprazole magnesium) ..... One tablet by mouth two times a day  Orders: TLB-Hepatic/Liver Function Pnl (80076-HEPATIC) TLB-Amylase (82150-AMYL) TLB-Lipase (83690-LIPASE)  Problem # 2:  CHOLEDOCHOLITHIASIS (ICD-574.50) Assessment: Improved Assx lately. Orders: TLB-Hepatic/Liver Function Pnl (80076-HEPATIC) TLB-Amylase (82150-AMYL) TLB-Lipase (83690-LIPASE)  Problem # 3:  HYPERLIPIDEMIA, MIXED (ICD-272.2) Assessment: Unchanged Will recheck today. Is on Fish oil regularly now as well. Her updated medication list for this problem includes:    Simvastatin 80 Mg Tabs (Simvastatin) ..... One tab by mouth at night  Orders: TLB-TSH (Thyroid Stimulating Hormone) (84443-TSH) TLB-Lipid Panel (80061-LIPID)  Problem # 4:  ANEMIA, PERNICIOUS (ICD-281.0) Assessment: Unchanged Will recheck. Orders: TLB-CBC Platelet - w/Differential (85025-CBCD)  Complete Medication List: 1)  Nexium 40 Mg Cpdr (Esomeprazole magnesium) .... One tablet by mouth two times a day 2)  Effexor 37.5 Mg Tabs (Venlafaxine hcl) .... One tab by mouth two times a day 3)  Propranolol Hcl 40 Mg Tabs (Propranolol hcl) .Marland Kitchen.. 1 tablet twice a day by mouth 4)  Ursodiol 300 Mg Caps (Ursodiol) .... One tab by mouth two times a day 5)  Simvastatin 80 Mg Tabs (Simvastatin) .... One tab by mouth at night  Other Orders: Venipuncture ([address]454) TLB-Renal Function Panel (80069-RENAL)  Patient Instructions: 1)  Will report labs via phone tree. Prescriptions: SIMVASTATIN 80 MG TABS (SIMVASTATIN) one tab by mouth at night  #30 Tablet x 11   Entered by:   Sydell Axon LPN   Authorized by:   Shaune Leeks MD   Signed by:   Sydell Axon LPN on  09/[birth year]1914   Method used:   Electronically to        [facility]. [address] [phone]* (retail)       [address] [place], [place]  [address]       Ph: [phone]       Fax: [phone]   RxID:   [id number] URSODIOL 300 MG CAPS (URSODIOL) one tab by mouth two times a day  #60 x 11   Entered by:   Sydell Axon LPN   Authorized by:   Shaune Leeks MD   Signed by:   Sydell Axon LPN on 56/38/7564   Method used:   Electronically to        [facility]. [address] [phone]* (retail)       [address] [place], [place]  [address]       Ph: [phone]       Fax: [phone]   RxID:   [id number] PROPRANOLOL HCL 40 MG TABS (PROPRANOLOL HCL) 1 tablet twice a day by mouth  #60 x 11   Entered by:   Sydell Axon LPN   Authorized by:   Shaune Leeks MD   Signed by:   Sydell Axon LPN on 61/60/7371   Method used:   Electronically to  [facility] [phone]* (retail)       [address] [place], [place]  [address]       Ph: [phone]       Fax: [phone]   RxID:   [id number] NEXIUM 40 MG CPDR (ESOMEPRAZOLE MAGNESIUM) one tablet by mouth two times a day  #60 x 11   Entered by:   Sydell Axon LPN   Authorized by:   Shaune Leeks MD   Signed by:   Sydell Axon LPN on 59/56/3875   Method used:   Electronically to        [facility]. [address] [phone]* (retail)       [address] [place], [place]  [address]       Ph: [phone]       Fax: [phone]   RxID:   [id number]   Current Allergies (reviewed today): No known allergies

## 2010-10-18 NOTE — Assessment & Plan Note (Signed)
Summary: B-12   Nurse Visit   Allergies: No Known Drug Allergies  Medication Administration  Injection # 1:    Medication: Vit B12 1000 mcg    Diagnosis: ANEMIA, PERNICIOUS (ICD-281.0)    Route: IM    Site: L deltoid    Exp Date: 09/16/2011    Lot #: [id number]    Mfr: [employer]    Patient tolerated injection without complications    Given by: Delilah Shan CMA (AAMA) (March 23, 2010 2:52 PM)  Orders Added: 1)  Admin of Therapeutic Inj  intramuscular or subcutaneous [96372] 2)  Vit B12 1000 mcg [J3420]   Medication Administration  Injection # 1:    Medication: Vit B12 1000 mcg    Diagnosis: ANEMIA, PERNICIOUS (ICD-281.0)    Route: IM    Site: L deltoid    Exp Date: 09/16/2011    Lot #: [id number]    Mfr: [employer]    Patient tolerated injection without complications    Given by: Delilah Shan CMA (AAMA) (March 23, 2010 2:52 PM)  Orders Added: 1)  Admin of Therapeutic Inj  intramuscular or subcutaneous [96372] 2)  Vit B12 1000 mcg [J3420]

## 2010-10-18 NOTE — Assessment & Plan Note (Signed)
Summary: B-12   Nurse Visit   Allergies: No Known Drug Allergies  Medication Administration  Injection # 1:    Medication: Vit B12 1000 mcg    Diagnosis: ANEMIA, PERNICIOUS (ICD-281.0)    Route: IM    Site: R deltoid    Exp Date: 07/18/2011    Lot #: [id number]    Mfr: [employer]    Patient tolerated injection without complications    Given by: Linde Gillis CMA (AAMA) (November 17, 2009 1:40 PM)  Orders Added: 1)  Vit B12 1000 mcg [J3420] 2)  Admin of Therapeutic Inj  intramuscular or subcutaneous [96045]

## 2010-10-18 NOTE — Assessment & Plan Note (Signed)
Summary: B-12 INJ/CLE   Nurse Visit   Allergies: No Known Drug Allergies  Medication Administration  Injection # 1:    Medication: Vit B12 1000 mcg    Diagnosis: ANEMIA, PERNICIOUS (ICD-281.0)    Route: IM    Site: L deltoid    Exp Date: 07/18/2011    Lot #: [id number]    Mfr: [employer]    Patient tolerated injection without complications    Given by: Benny Lennert CMA Duncan Dull) (June 08, 2010 3:05 PM)  Orders Added: 1)  Admin of Therapeutic Inj  intramuscular or subcutaneous [96372] 2)  Vit B12 1000 mcg [J3420]

## 2010-10-18 NOTE — Medication Information (Signed)
Summary: Approval for Additional Quantity Nexium/[employer]  Approval for Additional Quantity Nexium/[employer]   Imported By: Lanelle Bal 07/03/2010 10:02:37  _____________________________________________________________________  External Attachment:    Type:   Image     Comment:   External Document

## 2010-10-18 NOTE — Assessment & Plan Note (Signed)
Summary: B-12/Sonal Dorwart/JRR   Nurse Visit   Allergies: No Known Drug Allergies  Medication Administration  Injection # 1:    Medication: Vit B12 1000 mcg    Diagnosis: ANEMIA, PERNICIOUS (ICD-281.0)    Route: IM    Site: R deltoid    Exp Date: 03/16/2012    Lot #: [id number]    Mfr: [employer]    Patient tolerated injection without complications    Given by: Mervin Hack CMA (AAMA) (July 25, 2010 5:02 PM)  Orders Added: 1)  Vit B12 1000 mcg [J3420] 2)  Admin of Therapeutic Inj  intramuscular or subcutaneous [96372]   Medication Administration  Injection # 1:    Medication: Vit B12 1000 mcg    Diagnosis: ANEMIA, PERNICIOUS (ICD-281.0)    Route: IM    Site: R deltoid    Exp Date: 03/16/2012    Lot #: [id number]    Mfr: [employer]    Patient tolerated injection without complications    Given by: Mervin Hack CMA (AAMA) (July 25, 2010 5:02 PM)  Orders Added: 1)  Vit B12 1000 mcg [J3420] 2)  Admin of Therapeutic Inj  intramuscular or subcutaneous [19147]

## 2010-10-18 NOTE — Assessment & Plan Note (Signed)
Summary: Katie [name] b12/rbh   Nurse Visit   Allergies: No Known Drug Allergies  Medication Administration  Injection # 1:    Medication: Vit B12 1000 mcg    Diagnosis: ANEMIA, PERNICIOUS (ICD-281.0)    Route: IM    Site: R deltoid    Exp Date: 07/17/2011    Lot #: [id number]    Mfr: [employer]    Patient tolerated injection without complications    Given by: Delilah Shan CMA Duncan Dull) (Feb 09, 2010 4:31 PM)  Orders Added: 1)  Admin of Therapeutic Inj  intramuscular or subcutaneous [96372] 2)  Vit B12 1000 mcg [J3420]   Medication Administration  Injection # 1:    Medication: Vit B12 1000 mcg    Diagnosis: ANEMIA, PERNICIOUS (ICD-281.0)    Route: IM    Site: R deltoid    Exp Date: 07/17/2011    Lot #: [id number]    Mfr: [employer]    Patient tolerated injection without complications    Given by: Delilah Shan CMA (AAMA) (Feb 09, 2010 4:31 PM)  Orders Added: 1)  Admin of Therapeutic Inj  intramuscular or subcutaneous [96372] 2)  Vit B12 1000 mcg [J3420]

## 2010-10-18 NOTE — Assessment & Plan Note (Signed)
Summary: F/U BACK PAIN/FATIGUE  HX.STONES     Katie [name]    History of Present Illness Visit Type: Follow-up Visit Primary GI MD: Melvia Heaps MD [facility] Primary Provider: [name], MD Chief Complaint: Hx of gallstone,fatigue, back pain History of Present Illness:   Katie [name] has returned for evaluation of abdominal pain.  About 2 weeks ago she had recurrent severe pain under her right scapula radiating around to her right upper quadrant.  LFTs were normal.  Abdominal ultrasound demonstrated a 11 mm common bile duct which was unchanged.  Pain has subsequently subsided.  She is without fever or jaundice.   GI Review of Systems      Denies abdominal pain, acid reflux, belching, bloating, chest pain, dysphagia with liquids, dysphagia with solids, heartburn, loss of appetite, nausea, vomiting, vomiting blood, weight loss, and  weight gain.        Denies anal fissure, black tarry stools, change in bowel habit, constipation, diarrhea, diverticulosis, fecal incontinence, heme positive stool, hemorrhoids, irritable bowel syndrome, jaundice, light color stool, liver problems, rectal bleeding, and  rectal pain. Preventive Screening-Counseling & Management  Alcohol-Tobacco     Smoking Status: never      Drug Use:  no.      Current Medications (verified): 1)  Nexium 40 Mg Cpdr (Esomeprazole Magnesium) .... One Tablet By Mouth Two Times A Day 2)  Effexor 37.5 Mg  Tabs (Venlafaxine Hcl) .... One Tab By Mouth Two Times A Day 3)  Propranolol Hcl 40 Mg Tabs (Propranolol Hcl) .Marland Kitchen.. 1 Tablet Twice A Day By Mouth 4)  Ursodiol 300 Mg Caps (Ursodiol) .... One Tab By Mouth Two Times A Day 5)  Simvastatin 80 Mg Tabs (Simvastatin) .... One Tab By Mouth At Night  Allergies (verified): No Known Drug Allergies  Past History:  Past Medical History: Reviewed history from 12/16/2007 and no changes required. Recurrent choledocholithiasis Bile Duct Stricture  Past Surgical History: Reviewed history  from 08/22/2008 and no changes required. Foot surgery both feet Vein Stripping Lt. Leg Partial Hysterectomy bladder surgery x 2 Tubal Ligation Rt. wrist surgery Rt. Breast Lumpectomy Cholecystectomy  Family History: Reviewed history from 02/21/2010 and no changes required. Father 12/22/62Choked to death Etoh Mother 12-22-55Lung Ca Smoker Sister A 65 (Gail0 Knee and catarract Surg  GERD Sister A 68 Britta Mccreedy) Sister A 83 Maralyn Sago) Depression Anxiety Htn Brother A 60 Molly Maduro) GERD Precanc mouth surgery  Social History: Married lives at home  Occupation: Retired Patient has never smoked.  Alcohol Use - no Illicit Drug Use - no Smoking Status:  never Drug Use:  no  Review of Systems       The patient complains of back pain and fatigue.  The patient denies allergy/sinus, anemia, anxiety-new, arthritis/joint pain, blood in urine, breast changes/lumps, change in vision, confusion, cough, coughing up blood, depression-new, fainting, fever, headaches-new, hearing problems, heart murmur, heart rhythm changes, itching, menstrual pain, muscle pains/cramps, night sweats, nosebleeds, pregnancy symptoms, shortness of breath, skin rash, sleeping problems, sore throat, swelling of feet/legs, swollen lymph glands, thirst - excessive , urination - excessive , urination changes/pain, urine leakage, vision changes, and voice change.         All other systems were reviewed and were negative   Vital Signs:  Patient profile:   64 year old female Height:      63.5 inches Weight:      144 pounds BMI:     25.20 Pulse rate:   60 / minute Pulse rhythm:  regular BP sitting:   106 / 58  (left arm) Cuff size:   regular  Vitals Entered By: June McMurray CMA Duncan Dull) (April 13, 2010 9:32 AM)  Physical Exam  Additional Exam:  On physical exam she is a well-developed well-nourished female  skin: anicteric HEENT: normocephalic; PEERLA; no nasal or pharyngeal abnormalities neck: supple nodes: no cervical  lymphadenopathy chest: clear to ausculatation and percussion heart: no murmurs, gallops, or rubs abd: soft, nontender; BS normoactive; no abdominal masses, tenderness, organomegaly rectal: deferred ext: no cynanosis, clubbing, edema skeletal: no deformities neuro: oriented x 3; no focal abnormalities    Impression & Recommendations:  Problem # 1:  ABDOMINAL PAIN RIGHT UPPER QUADRANT (ICD-789.01) I strongly suspect that pain was due to recurrent choledocholithiasis.  This has subsided which raises the possibility that she spontaneously passed a stone.  In the past she has had similar symptoms in the face of normal LFTs and ERCP demonstrating recurrent bile duct stones.  Recommendations #1 expectant management.  If she develops recurrent pain reminiscent of her prior bile duct stones I will schedule an ERCP  Patient Instructions: 1)  Copy sent to : [name], MD 2)  Call back as needed  3)  The medication list was reviewed and reconciled.  All changed / newly prescribed medications were explained.  A complete medication list was provided to the patient / caregiver.

## 2010-10-18 NOTE — Progress Notes (Signed)
Summary: prior auth needed for nexium  Phone Note From Pharmacy   Caller: [facility] [address] #[id number]*/ [facility] Summary of Call: Prior Berkley Harvey is needed for nexium, twice a day dosing.  Form is on your desk. Initial call taken by: Lowella Petties CMA,  June 19, 2010 9:06 AM  Follow-up for Phone Call        done, in my out box.  Follow-up by: Crawford Givens MD,  June 19, 2010 10:46 AM  Additional Follow-up for Phone Call Additional follow up Details #1::        Faxed.   Additional Follow-up by: Delilah Shan CMA (AAMA),  June 19, 2010 11:03 AM     Appended Document: prior auth needed for nexium Prior auth given for nexium, pharmacy advised.  Approval letter placed on doctor's desk for signature and scanning.

## 2010-10-18 NOTE — Progress Notes (Signed)
        Additional Follow-up for Phone Call Additional follow up Details #2::    Patient just called back and wanted to know if she could be seen this evening because she is having the pain again. Let patient know that I would send Dr. [name] a note. Instructed patient to go to the er at this point as it is 4:59pm. Let her know that Dr. [name] was going to speak to Dr. Marina Goodell regarding patient. Follow-up by: Selinda Michaels RN,  October 05, 2010 5:00 PM  Additional Follow-up for Phone Call Additional follow up Details #3:: Details for Additional Follow-up Action Taken: Please call pt for f/u and send me a note. Thank you. Additional Follow-up by: Louis Meckel MD,  October 08, 2010 8:51 AM   Appended Document:  Spoke with patient and she states that she continued to have chills and nausea all through the weekend. She is taking the Cipro. The horrible pain she was having has stopped but she continues to have soreness in her right side. She has used a heating pad all weekend. Patient did not go the the ER. Patient would like to have an ultrasound done, states Dr. [name] told her if she had episodes like this she would need an ultrasound. Dr. [name] please advise.  Appended Document:  She needs ERCP.  See if you can switch  tomorrow's noon case for Wed and put Lummie in her place tomorrow.  No need for ultrasound.  Appended Document:  Spoke with endo at [facility] and they cannot do ercp at noon tomorrow. Checked on Wed at noon also but nothing available. Please advise.  Appended Document:  Dr. [name] endo called back and state they can do ercp on Wed 10/10/10 @ 12:30pm. Is this ok?  Appended Document:  yes  Appended Document:  Spoke with patient and let her know ercp scheduled for 10/10/10 @ 12:30pm. Patient is to arrive at 11:30am and to be NPO after midnight. Patient verbalized understanding.

## 2010-10-18 NOTE — Progress Notes (Signed)
Summary: Rx Effexor  Phone Note Refill Request Call back at [phone] Message from:  [facility] on January 04, 2010 8:13 AM  Refills Requested: Medication #1:  EFFEXOR 37.5 MG  TABS one tab by mouth two times a day   Last Refilled: 12/02/2009 Received from [facility]   Method Requested: Electronic Initial call taken by: Sydell Axon LPN,  January 04, 2010 8:13 AM    Prescriptions: EFFEXOR 37.5 MG  TABS (VENLAFAXINE HCL) one tab by mouth two times a day  #60 x 5   Entered and Authorized by:   Shaune Leeks MD   Signed by:   Shaune Leeks MD on 01/04/2010   Method used:   Electronically to        [facility]. [address] [phone]* (retail)       [address] [place], [place]  [id number]       Ph: [phone]       Fax: [phone]   RxID:   [id number]

## 2010-10-18 NOTE — Procedures (Signed)
Summary: ERCP  Patient: [name] Note: All result statuses are Final unless otherwise noted.  Tests: (1) ERCP (ERC)   ERC ERCP                  DONE     [facility]     [address] [place], [place]  [address]           ERCP PROCEDURE REPORT           PATIENT:  Katie, [name]  MR#:  [id number]     BIRTHDATE:  1946/08/21  GENDER:  female           ENDOSCOPIST:  Barbette Hair. [name], MD     ASSISTANT:           PROCEDURE DATE:  10/10/2010     PROCEDURE:  ERCP with removal of stones           INDICATIONS:  suspected stone           MEDICATIONS:   Fentanyl 100 mcg IV, Versed 11 mg IV, Benadryl 50     mg IV, glycopyrrolate (Robinal) 0.2 mg IV     TOPICAL ANESTHETIC:  Cetacaine Spray           DESCRIPTION OF PROCEDURE:   After the risks benefits and     alternatives of the procedure were thoroughly explained, informed     consent was obtained.  The Pentax ERCP [id number] endoscope     was introduced through the mouth and advanced to the second     portion of the duodenum.           Multiple stones were found. CBD was selectively cannulated with a     0.72mm guidewire. Multiple stones were seen throughout the CBD and     CHD. The duct was swept with a 15mm balloon stone extractor     multiple time and delivered stones/ fragments through the     sphincterotimized papilla. A basket stone extractor was also     utilized to remove several stone.     There's a slight CBD stricture in the midportion of the CBD (see     image1).    The scope was then completely withdrawn from the     patient and the procedure terminated.     <<PROCEDUREIMAGES>>           COMPLICATIONS:  None           ENDOSCOPIC IMPRESSION:     1) Stones, multiple - s/p stone exraction     RECOMMENDATIONS:     1) follow-up: office PRN           ______________________________     Barbette Hair. [name], MD           CC:           n.     eSIGNED:   Barbette Hair. Emillee Talsma at 10/10/2010 01:25 PM             Lily Peer, [phone]  Note: An exclamation mark (!) indicates a result that was not dispersed into the flowsheet. Document Creation Date: 10/10/2010 1:26 PM _______________________________________________________________________  (1) Order result status: Final Collection or observation date-time: 10/10/2010 13:20 Requested date-time:  Receipt date-time:  Reported date-time:  Referring Physician:   Ordering Physician: Melvia Heaps [phone]) Specimen Source:  Source: Launa Grill Order Number: [id number] Lab site:

## 2010-10-18 NOTE — Progress Notes (Signed)
Summary: Rx Effexor  Phone Note Refill Request Call back at [phone] Message from:  [facility] on July 03, 2010 4:08 PM  Refills Requested: Medication #1:  EFFEXOR 37.5 MG  TABS one tab by mouth two times a day   Last Refilled: 06/03/2010  Method Requested: Electronic Initial call taken by: Sydell Axon LPN,  July 03, 2010 4:08 PM    Prescriptions: EFFEXOR 37.5 MG  TABS (VENLAFAXINE HCL) one tab by mouth two times a day  #60 x 12   Entered and Authorized by:   Shaune Leeks MD   Signed by:   Shaune Leeks MD on 07/03/2010   Method used:   Electronically to        [facility]. [address] [phone]* (retail)       [address] [place], [place]  [id number]       Ph: [phone]       Fax: [phone]   RxID:   [id number]

## 2010-10-18 NOTE — Miscellaneous (Signed)
Summary: med list update   Clinical Lists Changes  Medications: Removed medication of SIMVASTATIN 40 MG TABS (SIMVASTATIN) 1 tablet by mouth once daily     Prior Medications: NEXIUM 40 MG CPDR (ESOMEPRAZOLE MAGNESIUM) one tablet by mouth two times a day EFFEXOR 37.5 MG  TABS (VENLAFAXINE HCL) one tab by mouth two times a day PROPRANOLOL HCL 40 MG TABS (PROPRANOLOL HCL) 1 tablet twice a day by mouth URSODIOL 300 MG CAPS (URSODIOL) one tab by mouth two times a day SIMVASTATIN 80 MG TABS (SIMVASTATIN) one tab by mouth at night Current Allergies: No known allergies

## 2010-11-08 ENCOUNTER — Encounter: Payer: Self-pay | Admitting: Family Medicine

## 2010-11-08 ENCOUNTER — Ambulatory Visit (INDEPENDENT_AMBULATORY_CARE_PROVIDER_SITE_OTHER): Payer: BC Managed Care – PPO

## 2010-11-08 DIAGNOSIS — D510 Vitamin B12 deficiency anemia due to intrinsic factor deficiency: Secondary | ICD-10-CM

## 2010-11-13 NOTE — Assessment & Plan Note (Signed)
Summary: b-12 jrt   Nurse Visit   Allergies: No Known Drug Allergies  Medication Administration  Injection # 1:    Medication: Vit B12 1000 mcg    Diagnosis: ANEMIA, PERNICIOUS (ICD-281.0)    Route: IM    Site: R deltoid    Exp Date: 03/16/2012    Lot #: [id number]    Mfr: [employer]    Patient tolerated injection without complications    Given by: Linde Gillis CMA Duncan Dull) (November 08, 2010 2:13 PM)  Orders Added: 1)  Vit B12 1000 mcg [J3420] 2)  Admin of Therapeutic Inj  intramuscular or subcutaneous [14782]

## 2011-01-29 NOTE — Assessment & Plan Note (Signed)
[facility]                         GASTROENTEROLOGY OFFICE NOTE   Katie [name], Katie [name]                        MRN:          [id number]  DATE:12/16/2007                            DOB:          [birth year]    PROBLEM:  Abdominal pain.   Katie [name] has returned for reevaluation.  For the last several days she  has been complaining of nagging right upper quadrant pain which radiates  to the back.  She has felt weak and had some low-grade fevers and  chills.  She has felt lightheaded.   PHYSICAL EXAMINATION:  Pulse 60, blood pressure 110/66, weight 139.  HEENT: EOMI.  PERRLA.  Sclerae are anicteric.  Conjunctivae are pink.  NECK:  Supple without thyromegaly, adenopathy or carotid bruits.  CHEST:  Clear to auscultation and percussion without adventitious  sounds.  CARDIAC:  Regular rhythm; normal S1 S2.  There are no murmurs, gallops  or rubs.  ABDOMEN:  Bowel sounds are normoactive.  Abdomen is soft, nontender and  nondistended.  There are no abdominal masses, tenderness, splenic  enlargement or hepatomegaly.  EXTREMITIES:  Full range of motion.  No cyanosis, clubbing or edema.  RECTAL:  Deferred.   IMPRESSION:  Probable recurrent choledocholithiasis.  I suspect she has  a low-grade cholangitis related to this.   RECOMMENDATION:  1. Begin Cipro 500 mg twice a day.  2. Check liver function tests, CBC, and urinalysis.  3. Repeat endoscopic retrograde cholangiopancreatography.     Barbette Hair. [name], MD,FACG  Electronically Signed    RDK/MedQ  DD: 12/16/2007  DT: 12/16/2007  Job #: [id number]

## 2011-01-29 NOTE — Assessment & Plan Note (Signed)
[facility]                               LIPID CLINIC NOTE   Katie, [name]                        MRN:          [id number]  DATE:04/02/2007                            DOB:          [birth year]    Mr. Katie [name] comes in today for followup of her hyperlipidemia therapy.  She has been compliant with Vytorin 10/40.  She denies any muscle aches  and pains.   Her other medications have not changed.  They include Nexium, Effexor  XR, Inderal, Actigall.   VITAL SIGNS:  Her weight is 143, blood pressure is 120/65, heart rate is  60.   LABORATORY DATA:  Total cholesterol 239, triglycerides 275, HDL 48.7,  LDL 132.2.  Liver function tests are within normal limits.   ASSESSMENT:  Ms. Reising has been compliant with her medications but her  triglycerides and LDL have risen.  Her HDL decreased slightly but remain  at goal of greater than 40.  Ms. Fryman has continued to limit red meat.  She eats a lot of chicken and vegetables.  She does not eat much  seafood.  She does admit to a lot of sweets and deserts.  She has not  been exercising on a regular basis.  When she does exercise it includes  walking or riding a bike.   PLAN:  Ms. Vanderwerf wants to continue with her Vytorin 10/40.  I have  recommended starting over-the-counter fish oil, 1 gram per day moving it  to 2 grams per day for now and I encouraged her to improve her diet by  reducing sweets and also start a regular exercise program where she is  walking or riding a bike for at least 20-30 minutes at a time and doing  this at least 3-4 times per week.  She seemed motivated to do these  things to get her numbers back down to goal.  And we are going to follow  up with her here in the lipid clinic in 4 months.  Samples of Vytorin  were given as well as a prescription refill authorization called into  her drug store.  She was encouraged to call us if there are any problems  or concerns in the  meantime.      Charolotte Eke, PharmD  Electronically Signed      Rollene Rotunda, MD, [facility]  Electronically Signed   TP/MedQ  DD: 04/02/2007  DT: 04/02/2007  Job #: [id number]

## 2011-01-29 NOTE — Assessment & Plan Note (Signed)
[facility]                               LIPID CLINIC NOTE   JADA, FASS                        MRN:          [id number]  DATE:07/27/2007                            DOB:          06-01-1946    Ms. Wheller is seen back in the Lipid Clinic for further evaluation,  medication titration associated with her concomitant cholesterol  lowering therapy and Vytorin 10/40 one tablet daily in addition to over  the counter fish oil daily.  Ms. Kahan relates no problems in taking her  medication, she has continued to increase her chicken and vegetable  intake and decrease her red meat intake.  She has been walking or  bicycling 20-30 minutes at least twice each week.  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   PAST MEDICAL HISTORY:  1. Hypercholesterolemia.  2. Multiple episodes of gallstone formation and followup on surgical      interventions.   CURRENT MEDICATIONS:  1. Nexium 40 mg twice daily.  2. Effexor XR 75 mg daily.  3. Vytorin 10/40 one tablet daily.  4. Inderal 20 mg twice daily.  5. Actigall 300 mg twice daily.  6. Fish oil 1 gram twice daily.   DRUG ALLERGIES:  None are noted.   PHYSICAL EXAMINATION:  Weight today is 142 pounds, blood pressure is  124/64, heart rate is 62.   Labs on July 22, 2007 reveal total cholesterol 195, triglycerides  126, HDL 48.4, LDL 121, LFTs are within normal limits.   ASSESSMENT:  The patient has been doing a good job on her diet.  She  will continue to work on this.  She has been doing her physical activity  to some extent and she will continue to work to increase that to 3-4  days weekly of aerobic activity for 20-30 minutes.  At this point we  will keep her on her current therapy of Vytorin 10/40 one tablet daily  in addition to her fish oil and will follow up with her in 6 months.  I  have discussed the patient  the findings of the JUPITER trial which were  released today and we will evaluate her formulary plan through her  insurer and  determine if a switch to other lipid lowering therapy will be most  appropriate to save her the most money.      Shelby Dubin, PharmD, BCPS, CPP  Electronically Signed      Rollene Rotunda, MD, [facility]  Electronically Signed   MP/MedQ  DD: 07/28/2007  DT: 07/29/2007  Job #: [id number]   cc:   Arta Silence, MD

## 2011-01-29 NOTE — Assessment & Plan Note (Signed)
[facility]                            CARDIOLOGY OFFICE NOTE   Katie, [name]                        MRN:          [id number]  DATE:10/27/2007                            DOB:          10-31-1945    Deshunda returns today for follow-up.  I have seen her in the past for  chest pain, dyspnea, hypercholesterolemia.   The patient has been doing fairly well.  She gets occasional atypical  pain.  It is sharp, it is in the center of her chest.  It is fleeting  and it does not sound like angina at all. Her last stress test was I  believe in 2007 and was totally normal with an EF of 73%. She has had  benign palpitations in the past.  She has not had any recurrence in  about 6 months.  She is on a beta blocker.  She has had a structurally  normal heart by echo.   Her baseline EKG is also normal and with her beta blocker, her resting  heart rate is about 60.  She has had dyspnea in the past.  She tends to  get URIs.  She has not been on antibiotics for over year.  There has  been no coughing, fever or sputum production.  She is currently doing  better.  Her review of systems otherwise remarkable for adding fish oil  to her Vytorin with a decreased LDL. She is extremely happy her son is  getting married.  He has found a nice girl after a bad divorce.  He has  three children and his new wife has two. She seems quite relieved by  this. Katie [name] continues to be under a bit of stress.  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 I  suspect she needs to slow down and think about retiring if at all  possible.   MEDICATIONS:  1. Nexium 40 b.i.d.  2. Effexor 75 a day.  3. Vytorin 10/40.  4. Inderal 20 b.i.d.  5. B12 fish oil.  6. Actigall.   PHYSICAL EXAMINATION:  Remarkable for a healthy-appearing white female  in no distress.  Affect is  appropriate.  Weight 140, blood pressure 130/70, pulse 62 and regular, afebrile.  HEENT:  Unremarkable.  Carotids are normal without bruit, no  lymphadenopathy, thyromegaly, or JVP elevation.  LUNGS:  Clear with good diaphragmatic motion, no wheezing.  S1and S2 with normal heart sounds.  PMI normal.  ABDOMEN:  Benign. Bowel sounds positive.  No AAA, no tenderness, no  hepatosplenomegaly or hepatojugular reflux, and no tenderness.  Distal pulses intact, no edema.  NEUROLOGIC:  Nonfocal.  SKIN:  Warm and dry.  No muscular weakness.   EKG normal. Sinus rhythm, rate of 59, PR interval 174, QT interval 430.   IMPRESSION:  1. Benign palpitations, stable.  Continue beta-blocker.  2. Functional dyspnea. Previous history of bronchitis with chronic      bronchitic changes on chest x-ray.  Normal LV function.  Consider      switching Inderal in the future to a more selective beta blocker.  3. Hypercholesterolemia, improved. LDL under 100.  Continue fish oil      and Vytorin. LFTs in 6 months.  4. Anxiety and depression, currently improved. Continue Effexor 75 mg      a day.  5. History of reflux.  Continue Nexium 40 b.i.d. Low spinous diet,      avoid late-night meals. Again, I congratulated Katie [name] see on her      son's engagement and encouraged her to try to get some balance in      her life in terms of the stress of her two jobs.     Noralyn Pick. Eden Emms, MD, [facility]  Electronically Signed    PCN/MedQ  DD: 10/27/2007  DT: 10/29/2007  Job #: [id number]

## 2011-01-29 NOTE — Assessment & Plan Note (Signed)
[facility]                               LIPID CLINIC NOTE   Katie [name], Katie [name]                        MRN:          [id number]  DATE:02/01/2008                            DOB:          1946-05-06    Patient seen back in the lipid clinic for further evaluation and medical  titration associated with her hyperlipidemia in the setting of high risk  features.  She has been compliant with her Vytorin 10/40 1 tablet daily  as well as her fish oil 1000 mg twice daily.  She has been tolerating  these therapies without issue.  She does not smoke, and she does not  drink alcohol regularly.   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  Her diet is about the same.  She continues to enjoy vegetables  and not very much red meat.   PAST MEDICAL HISTORY:  Pertinent for high risk features.   CURRENT MEDICATIONS:  On track and updated in the EMR system at [facility].   Weight today is 143 pounds.  Blood pressure is 132/80.  Respirations are  16.   Labs on December 16, 2007 revealed normal LFTs.  She had ERCP #19 on December 23, 2007.  She states that she missed her lab appointment, as her son got  married last week, and diet and exercise have been worse recently.  As  you know, she needs to eat more healthy food and cut down on her  cheesecake, and she will work to do that.  She will continue on Vytorin  10/40 1 tablet daily and her fish oil for now.  She will keep up the  good work with her diet and exercise.  She certainly is very aware of  what she needs to be doing.  She will call with questions or problems.  In the meantime, she will have lab work checked at [facility] on Feb 03, 2008.  We will ask these results be sent to [place], and we will call her  at [phone] with those results and make adjustments as needed.      Shelby Dubin, PharmD, BCPS, CPP  Electronically Signed      Rollene Rotunda, MD, [facility]  Electronically Signed   MP/MedQ  DD: 02/03/2008  DT: 02/03/2008  Job #: [id number]   cc:   Arta Silence, MD

## 2011-02-01 NOTE — Procedures (Signed)
[facility]  Patient:    Katie [name], Katie [name] Visit Number: [id number] MRN: [id number]          Service Type: END Location: ENDO Attending Physician:  Mervin Hack Dictated by:   Hedwig Morton. Juanda Chance, M.D. [facility] Admit Date:  09/04/2001                             Procedure Report  PROCEDURE:  Colonoscopy.  INDICATIONS:  This 65 year old white female has a history of choledochal lithiasis, status post sphincterotomy and ERCPs.  She has had persistent right upper quadrant abdominal pain.  She also is complaining of irregular bowel habits and mucus in her stools.  She has been under a great deal of stress recently.  There is no family history of colon cancer.  The patient has failed therapy with antispasmodics.  She is undergoing colonoscopy for evaluation of right upper quadrant abdominal pain.  ENDOSCOPE:  Olympus single-channel video endoscope.  SEDATION:  Versed 7 mg IV and Demerol 90 mg IV.  FINDINGS:  The Olympus single-channel video endoscope was passed under direct vision through the rectum to the sigmoid colon.  The patient was monitored by pulse oximetry.  Oxygen saturations were normal.  Her prep was suboptimal. There as a large amount of liquid, as well as semisolids throughout the colon. The anal anal and rectal ampulla were unremarkable.  The sigmoid colon did not show any evidence of diverticulosis.   The colonoscope passed easily through the descending colon, splenic flexure, transverse colon, hepatic flexure, and ascending colon.  The cecal pouch and ileocecal valve were unremarkable, except for a large amount of semisolid stool.  The terminal ileum was not intubated.  The colonoscope was then retracted.  Some of the stool was suctioned out, but some could not be suctioned because of large particles. The patient tolerated the procedure well.  IMPRESSION: 1. Suboptimal colon prep. 2. Otherwise normal colonoscopy to the  cecum.  PLAN:  Treat as irritable bowel syndrome with high-fiber diet, fiber supplements, and a trial of antispasmodic, Donnatal one tablet on a p.r.n. basis. Dictated by:   Hedwig Morton. Juanda Chance, M.D. [facility] Attending Physician:  Mervin Hack DD:  09/04/01 TD:  09/05/01 Job: [id number]

## 2011-02-01 NOTE — Assessment & Plan Note (Signed)
[facility]                         GASTROENTEROLOGY OFFICE NOTE   Katie [name], Katie [name]                        MRN:          [id number]  DATE:12/17/2006                            DOB:          August 07, 1946    PROBLEM:  Abdominal pain, fever, chills.   [name] has returned complaining of 2 weeks of mild symptoms and  several days of more severe symptoms and right upper quadrant pain  radiating to the back with chills and nausea.  Bile duct stones were  last cleared in September 2007.  She was subsequently put on  phenobarbital 15 mg twice a day.  She was well until the last couple of  weeks.  She has also noted minimal amounts of rectal bleeding consisting  of small amounts of blood mixed with her stools.  Colonoscopy a year ago  was unremarkable.   PHYSICAL EXAMINATION:  Pulse 80, blood pressure 118/70, weight 144.  HEENT: EOMI. PERRLA. Sclerae are anicteric.  Conjunctivae are pink.  NECK:  Supple without thyromegaly, adenopathy or carotid bruits.  CHEST:  Clear to auscultation and percussion without adventitious  sounds.  CARDIAC:  Regular rhythm; normal S1 S2.  There are no murmurs, gallops  or rubs.  ABDOMEN:  Bowel sounds are normoactive.  Abdomen is soft, non-tender and  non-distended.  There are no abdominal masses, tenderness, splenic  enlargement or hepatomegaly.  EXTREMITIES:  Full range of motion.  No cyanosis, clubbing or edema.  RECTAL:  Deferred.   IMPRESSION:  1. Recurrent choledocholithiasis with a low grade cholangitis.  2. Probable hemorrhoidal bleeding.   RECOMMENDATION:  1. Begin Cipro 500 mg twice a day.  2. Repeat the ERCP with stone extraction.     Barbette Hair. [name], MD,FACG  Electronically Signed    RDK/MedQ  DD: 12/17/2006  DT: 12/17/2006  Job #: [id number]   cc:   Arta Silence, MD

## 2011-02-01 NOTE — Assessment & Plan Note (Signed)
[facility]                           GASTROENTEROLOGY OFFICE NOTE   [name], Katie [name]                        MRN:          [id number]  DATE:04/02/2006                            DOB:          [birth year]    PROBLEM:  Abdominal pain.   Katie [name] was originally scheduled for follow-up from her colonoscopy.  She  had limited rectal bleeding.  Colonoscopy was unremarkable.  Yesterday she  had severe right upper quadrant lasting several hours.  This is her typical  pain for recurring bile duct stones.  She was last examined at ERCP in April  where recurrent stones were removed.  She has a known bile duct stricture.  She has not had fever or chills.   PHYSICAL EXAMINATION:  VITAL SIGNS:  Pulse 56, blood pressure 114/60, weight 137.   HEENT: EOMI. PERRLA. Sclerae are anicteric.  Conjunctivae are pink.   NECK:  Supple without thyromegaly, adenopathy or carotid bruits.   CHEST:  Clear to auscultation and percussion without adventitious sounds.   CARDIAC:  Regular rhythm; normal S1 S2.  There are no murmurs, gallops or  rubs.   ABDOMEN:  She has mild right upper quadrant tenderness without guarding,  rebound.   EXTREMITIES:  Full range of motion.  No cyanosis, clubbing or edema.   RECTAL:  Deferred.   IMPRESSION:  1.  Recurrent bile duct stones.  2.  Benign biliary stricture.  3.  Limited rectal bleeding - secondary to hemorrhoids.   RECOMMENDATION:  ERCP.                                   Barbette Hair. [name], MD, [facility]   RDK/MedQ  DD:  04/02/2006  DT:  04/02/2006  Job #:  [id number]

## 2011-02-01 NOTE — Procedures (Signed)
[facility]  Patient:    Katie [name], Katie [name]                    MRN: [id number] Adm. Date:  [id number] Attending:  Mervin Hack CC:         Dr. [name]                           Procedure Report  PROCEDURE:  Endoscopic retrograde cholangiopancreatography.  INDICATION:  This 65 year old white female has a history of choledocholithiasis, common bile duct stricture, status post remote cholecystectomy.  Last ERCP done in 1999 through a wide-open sphincterotomy. She has now three days of right upper quadrant abdominal pain and fever, a white cell count of 2200, but normal liver function tests.  The pain subsided yesterday.  She has known pneumobilia due to previous sphincterotomy.  She is now undergoing ERCP after ultrasound of the right upper quadrant showed stone in the distal common bile duct, which was freely mobile.  ENDOSCOPE:  Olympus single-channel side-viewing duodenoscope.  SEDATION:  Versed 5 mg IV, Demerol 70 mg IV, glucagon 0.5 mg IV.  CONTRAST:  Conray 30 cc.  FINDINGS:  The Olympus single-channel side-viewing duodenoscope was passed blindly through the esophagus into the stomach.  Gastric mucosa was normal with the exception of retained food.  The pyloric outlet was normal.  The duodenal bulb was also normal with easily-identified papilla.  The papilla was large, and bile was exuding from the papilla.  It was consistent with post-sphincterotomy appearance of the biliary papilla.  It was cannulated without difficulty, and selectively the common bile duct was filled, which showed a large diameter of 12 mm across.  There was smooth tapering distally without any evidence of stricture.  A cholangiogram was obtained with pneumobilia and large amount of air within the common hepatic duct and left and right hepatic duct.  After clearing the duct of air bubbles, no definite stones were observed.  An 8.5 mm balloon was passed into the  left and right hepatic ducts over a guidewire and was dragged through the common bile duct. At the takeoff the cystic duct, there was mild kink of the common bile duct, which was observed only from the AP view but was not confirmed from the lateral and side view.  Since the 8.5 mm balloon was passed easily, repeated the same sweep with a 12 mm balloon, which was repeatedly insufflated in the kink-like area at the takeoff of the cystic duct.  The balloon easily passed through this area and through the sphincterotomy.  There was actually no definite stone in the common bile duct or in the hepatic duct.  Questionable air bubbles were noted in the left hepatic system.  This was confirmed by fluoroscopy.  The patient tolerated the procedure well.  IMPRESSION: 1. Mildly dilated common bile duct without evidence of obstruction or stones. 2. Status post sweep of the common bile duct with 8.5 and 12 mm balloons, with    no evidence of stone. 3. Functioning sphincterotomy. 4. Intact _____ duct, selectively not cannulated.  PLAN:  The patient will be observed overnight with antibiotic Cipro 400 mg IV q.12h.  She will most likely be able to be discharged in the morning.  We will repeat her liver function tests.  We have also advised her to have an antibiotic Cipro 250 mg p.o. b.i.d. at home available in case of new onset of right upper  quadrant pain and chills, which usually means cholangitis. Because she is at high risk for cholangitis due to pneumobilia, she may benefit from broad-spectrum antibiotic coverage periodically. DD:  04/16/00 TD:  04/17/00 Job: [id number]

## 2011-02-01 NOTE — Discharge Summary (Signed)
NAMEKENIKA, Katie [name]                       ACCOUNT NO.:  [id number]   MEDICAL RECORD NO.:  [id number]                   PATIENT TYPE:  INP   LOCATION:  0373                                 FACILITY:  [facility]   PHYSICIAN:  Barbette Hair. [name], M.D. [facility]          DATE OF BIRTH:  1946/07/07   DATE OF ADMISSION:  07/16/2003  DATE OF DISCHARGE:  07/21/2003                                 DISCHARGE SUMMARY   ADMISSION DIAGNOSES:  1. Abdominal pain and nausea x3 weeks felt secondary to a recurrent     cholelithiasis.  2. Status post a remote open cholecystectomy.  3. History of recurrent common bile duct stones with multiple endoscopic     retrograde cholangiopancreatographies and stone extractions.  4. Status post partial hysterectomy.  5. Chronic gastroesophageal reflux disease.  6. Hyperlipidemia.  7. Depression.  8. Status post a bladder tack.   DISCHARGE DIAGNOSES:  Stable status post endoscopic retrograde  cholangiopancreatography and stone extraction x2 with finding of a distal  biliary stricture requiring dilation and stent placement following stone  extraction on July 21, 2003.   CONSULTATIONS:  None.   PROCEDURE:  1. ERCP x2 per Barbette Hair. [name], M.D.  2. Upper abdominal ultrasound.   BRIEF HISTORY:  Katie [name] is a pleasant 65 year old white female known to Lina Sar, M.D. with remote cholecystectomy done for gallstones in 1975.  Since  then she has had multiple recurrent episodes of choledocholithiasis with  several ERCPs and stone extractions.  She has been on URSO chronically.  At  this time, she is admitted with a three week history of recurrent abdominal  pain and nausea.  She said initially the pain was not severe and seemed to  subside over a few days.  However, her pain reoccurred on October 28th and  has been persistent and more severe since that time.  She presented to the  emergency room due to unbearable pain.  She does have nausea but no  vomiting, has had  chills but no documented fever.  She was seen and  evaluated by Anselmo Rod, M.D. who was covering on call and admitted to  the hospital with probable recurrent common bile duct stone.  Her last ERCP  and stone extraction was done in 2002.   LABORATORY STUDIES:  On admission October 31st, WBC was 9.3, hemoglobin  13.4, hematocrit of 39.1, MCV of 89.8, platelets 306.  Follow up on November  3rd, WBC of 4.2, hemoglobin 12.2, hematocrit 34.7.  Pro time on admission  14.9, INR of 1.3, PTT of 28.  Electrolytes within normal limits.  BUN 15,  creatinine 0.8, albumin 3.4.  On admission total bilirubin 1.3, alk phos 83,  ALT 44, AST 84.  Follow up on November 1st, total bilirubin 4.1, alk phos  67, ALT 118 and AST of 96.  On November 4th total bilirubin 1, alk phos 66,  ALT of 45 and AST of 20.  Lipase on the 3rd was elevated post ERCP at 85,  amylase was 85.  Cholesterol was 147, triglycerides 278, HDL low at 30, LDL  61, and VLDL high at 56.   X-RAY STUDIES:  Abdominal ultrasound on October 31st shows a diffusely  dilated common bile duct 12 to 16 mm and at least one focal calculus in the  duct measuring approximately 8 mm.  Also had some associated intrahepatic  biliary ductal dilation.   HOSPITAL COURSE:  The patient was admitted by Anselmo Rod, M.D. covering  on call.  She was kept on p.o., started on IV fluids, given Dilaudid and  Phenergan for control of pain and nausea.  She was scheduled for abdominal  ultrasound with findings as outlined above and covered with IV Primaxin.  She was scheduled for an ERCP with Barbette Hair. [name], M.D. the following day  with stone extraction.  This procedure was completed and she tolerated this  without difficulty and was found to have multiple stones and sludge and a  nasobiliary catheter was placed to irrigate the duct.  This was pulled the  following day at which time she was feeling better.  Her bilirubin had gone  at that point from 4 down to  1.4.  The following day, November 3rd, she was  feeling a little bit weak and complaining of some increased discomfort in  the right upper quadrant and very sore to touch.  She felt that she may  have another stone as this was a similar sensation that she had had in the  past.  We observed her another 24 hours and the following morning she was  still symptomatic and feeling that something was not quite right.  She had  not had any nausea or vomiting and her LFTs had continued to improve.  It  was decided due to her past history to proceed with another ERCP and see if  she had any retained sludge, etc.  At that time, she was noted to have  retained sludge, a stone, and a biliary stricture which was not apparent on  the first ERCP.  The stricture was dilated and a stent was placed following  the stone extraction.  She tolerated this procedure well and later that  afternoon she was feeling much better and she was felt thus stable for  discharge to home.  She was discharged with instructions to follow up in the  office with Barbette Hair. [name], M.D. on November 29th at 11:15 a.m. and to  call sooner for any problems.  She had seen Dr. Juanda Chance in the past and  arrangements were made for Dr. [name] to see her as it was felt that she was  going to require further dilations of the distal common bile duct.   DIET:  Regular.   MEDICATIONS:  1. Cipro 500 mg b.i.d. x5 days.  2. Percocet one every 4-6 hours if needed for pain.  3. Lipitor, Inderal, Prilosec, Effexor, and Premarin all as previous.   CONDITION ON DISCHARGE:  Stable and improved.     Mike Gip, P.A.-C. [facility]                Robert D. [name], M.D. [facility]    AE/MEDQ  D:  08/10/2003  T:  08/10/2003  Job:  [id number]

## 2011-02-01 NOTE — Discharge Summary (Signed)
Katie [name], Katie [name] NO.:  [id number]   MEDICAL RECORD NO.:  [id number]          PATIENT TYPE:  INP   LOCATION:  4731                         FACILITY:  [facility]   PHYSICIAN:  Arvilla Meres, M.D. LHCDATE OF BIRTH:  1946-01-22   DATE OF ADMISSION:  02/19/2006  DATE OF DISCHARGE:  02/20/2006                           DISCHARGE SUMMARY - REFERRING   DISCHARGE DIAGNOSES:  Atypical chest discomfort and anxiety; history as  noted below.   SUMMARY OF HISTORY:  Katie [name] is a 65 year old white female who presented  to the emergency room with the 47-month history of left anterior chest  discomfort that she described as sharp and as a dull pressure, occurring 1-5  times a day, lasting less than 5 minutes, associated with shortness of  breath and diaphoresis, worse with stress.   MEDICAL HISTORY:  1.  Hypertension.  2.  Venous insufficiency with bilateral lower extremity vein strippings in      1994.  3.  Normal ABIs in October 2004.  4.  History of tachypalpitations with negative event monitors in the past.  5.  GERD.  6.  IBS.  7.  Last stress test in December of 2003 showed an EF of 69%, no ischemia.   LABORATORY DATA:  Chest x-ray did not show any active disease.  EKG showed  sinus bradycardia, normal sinus rhythm without any changes.  Admission H&H  was 13.3 and 38.1, normal indices, platelets 259, WBCs 4.8, sodium 138,  potassium 40, BUN 11, creatinine 0.8, normal LFTs.  CK MBs and troponins  were within normal limits x3.  PTT 26, PT 13.  TSH was within normal limits.   HOSPITAL COURSE:  Katie [name] was admitted to 4700 overnight for observation.  Overnight, she did not have any further chest discomfort.  Enzymes and EKGs  were negative for myocardial infarction.  After review, Dr. Gala Romney felt  that she could be discharged home with outpatient follow-up.   DISPOSITION:  The patient is discharged home.  She is asked to maintain low  salt, fat and cholesterol  diet.  Her activities are not restricted.  She was  asked to bring all medications to all follow-up appointments.   DISCHARGE MEDICATIONS:  1.  Inderal 40 mg b.i.d.  2.  Vytorin 10/40 daily.  3.  Nexium 40 b.i.d.  4.  Actigall 300 mg b.i.d.  5.  Effexor 75 mg daily.   She will have a stress Myoview today on February 20, 2006 at 10:00 a.m.   FOLLOWUP:  1.  Follow up with Dr. Eden Emms on March 07, 2006 4:15 p.m.  2.  She was asked to arrange a follow-up appointment with her primary care      physician for continued treatment of her anxiety and medical issues.      Joellyn Rued, P.A. [facility]      Arvilla Meres, M.D. [facility]  Electronically Signed    EW/MEDQ  D:  02/20/2006  T:  02/20/2006  Job:  [id number]   cc:   Barbette Hair. [name], M.D. [facility]  [address]  [place]  [place]  [address]   Charlton Haws, M.D.  [address]  [address]  [place]  [place] [address]

## 2011-02-01 NOTE — H&P (Signed)
[place]  Patient:    Katie [name], Katie [name] Visit Number: [id number] MRN: [id number]          Service Type: MED Location: [phone] Attending Physician:  Iva Boop Dictated by:   Sammuel Cooper, P.A. Admit Date:  03/25/2002 Discharge Date: 03/27/2002   CC:         [name], M.D. [facility]   History and Physical  CHIEF COMPLAINT:  Recurrent severe epigastric and back pain with history of choledocholithiasis.  HISTORY OF PRESENT ILLNESS:  The patient is a pleasant 65 year old white female known to Dr. Lina Sar with history of cholecystectomy in 1975.  She has a history of recurrent common bile duct stones and has required several ERCPs and stone extractions over the years.  She last had ERCP in August 2001, which showed a mildly dilated common bile duct but no evidence of obstruction or stones.  The bile duct was sweeped with balloons.  The main pancreatic duct was not cannulated at that time.  She has been maintained for the long term on Actigall as well.  She said she had done well for a couple of years and had developed some recurrent vague mid back pain over the past couple of months. She said on Sunday, March 21, 2002, she developed a severe episode of epigastric pain radiating straight through into her back and very reminiscent of her prior episodes.  This lasted for a couple of hours.  She actually came to the emergency room at [facility], was seen and evaluated.  Her laboratories were all unremarkable, and she was discharged to home as her pain had eased off. She had done fine since that time until last evening at about 11 p.m. when she had another very similar attack, which she rates as a 10/10.  This lasted for about an hour, eased off.  She was awakened at 2 a.m., again with very similar pain lasting an hour to 1-1/2 hours.  No associated nausea, vomiting, chills, fever, diarrhea, melena, or hematochezia.  Her pain eased  off again.  Then at 11 a.m. this morning she had another recurrent episode and came to the emergency room.  This episode lasted for several hours but, at this time, has abated.  Laboratories in the ER showed a WBC of 7.9, hemoglobin 12.6, hematocrit of 35.5, MCV of 91, platelets 290.  Electrolytes within normal limits.  Total bilirubin 0.6, alkaline phosphatase 63, SGOT 26, SGPT 22, amylase of 38, and lipase of 20.  The patient is admitted at this time for pain control, and will schedule ERCP with possible stone extraction tomorrow a.m.  MEDICATIONS: 1. Lipitor 20 mg p.o. q.d. 2. Prilosec 20 p.o. q.d. 3. Premarin 0.625 p.o. q.d. 4. Actigall 300 b.i.d. 5. Inderal 10 b.i.d. 6. Advair 100/50 b.i.d. 7. Effexor 75 q.d.  ALLERGIES:  GI upset with NSAIDs.  PAST MEDICAL HISTORY: 1. GERD. 2. IBS. 3. Cholecystectomy in 1975, with history of recurrent common bile duct stones    and several ERCP stone extractions. 4. Varicose vein stripping on the left. 5. Status post hysterectomy. 6. Bladder suspension x 2, last done April 2003. 7. Bilateral bunionectomies. 8. History of tachyarrhythmia.  FAMILY HISTORY:  Negative for coronary artery disease.  Positive for cancers: Breast cancer on the paternal side of the family, and lung cancer on the maternal side of the family.  SOCIAL HISTORY:  The patient is married.  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  She is a nonsmoker, nondrinker.  REVIEW OF SYSTEMS:  CARDIOVASCULAR:  The patient says she has been stressed recently secondary to her work schedule, etc., and has been having some recent vague chest pain which she says is anxiety-induced, but occasionally it occurs at rest.  No recent cardiac evaluation.  No history of coronary disease.  None over the past few days.   GASTROINTESTINAL:  As outlined above. MUSCULOSKELETAL:  Pertinent for vague mid thoracic back pain recently with no history of degenerative disk disease, etc.  All other review of systems otherwise negative.  PHYSICAL EXAMINATION:  GENERAL:  Well-developed white female in no acute distress.  Alert and oriented x3.  VITAL SIGNS:  Afebrile, O2 saturation 99, blood pressure 126/55, pulse 63.  HEENT:  Anicteric.  CARDIOVASCULAR:  Regular rate and rhythm.  With S1 and S2.  No murmur, rub, or gallop.  LUNGS:  Clear to A&P.  ABDOMEN:  Soft.  Bowel sounds are active.  She is mildly tender in the epigastrium and right upper quadrant.  No guarding or rebound.  She does have a cholecystectomy scar.  No mass or hepatosplenomegaly.  RECTAL:  Not done today.  EXTREMITIES:  Without clubbing, cyanosis, or edema.  Pulses are intact.  NEUROLOGIC:  Nonfocal.  IMPRESSION:  1. The patient is a 65 year old white female status post remote     cholecystectomy with history of recurrent choledocholithiasis, now with     recurrent severe episodic epigastric pain radiating to the back x 4 days.     Rule out recurrent choledocholithiasis.  2. History of recent vague chest pain.  No history of coronary artery     disease.  May need further evaluation.  3. Status post hysterectomy.  4. Status post bilateral tubal ligation.  5. Status post bladder suspension x 2.  6. History of irritable bowel syndrome.  7. Anxiety and depression.  8. Tachyarrhythmia.  9. Gastroesophageal reflux disease. 10. Asthmatic bronchitis.  PLAN:  The patient is admitted to the service of Dr. Stan Head for pain control, IV fluid hydration, bowel rest, and will schedule ERCP for a.m., March 26, 2002.  If this is negative, will proceed with further evaluation with CT scan of the abdomen and pelvis or MRI of the abdomen.  For details please see the orders. Dictated by:   Sammuel Cooper, P.A. Attending Physician:  Iva Boop  DD:  03/25/02 TD:  03/28/02 Job: [id number]

## 2011-02-01 NOTE — H&P (Signed)
Katie [name], Katie [name]                       ACCOUNT NO.:  [id number]   MEDICAL RECORD NO.:  [id number]                   PATIENT TYPE:  INP   LOCATION:  0373                                 FACILITY:  [facility]   PHYSICIAN:  Anselmo Rod, M.D.               DATE OF BIRTH:  [birth year]   DATE OF ADMISSION:  07/16/2003  DATE OF DISCHARGE:                                HISTORY & PHYSICAL   REASON FOR ADMISSION:  Abdominal pain with nausea for three weeks.   HISTORY OF PRESENT ILLNESS:  [name] is a 65 year old white female  status post open cholecystectomy for gallstones in 1975, who has had  recurrent CBD stones on over eight occasions (as per the patient) requiring  ERCP with stone extraction.  She claims she was in her usual state of health  until about three weeks ago when the pain recurred.  She experienced some  epigastric discomfort and symptoms abated after a couple of days.  They  recurred on July 14, 2003, and have not abated since then.  She describes  the pain as being unbearable which prompted her to come to the emergency  room at [facility] last night.  She had some nausea associated with her  symptoms, but no vomiting.  She describes some chills, but no fever.  There  is no history of hematochezia, melena, appetite has been decreased.  Weight  has been stable.  She has a history of reflux requiring the use of OTC  Prilosec for the last 15 years.  She denies any cardiorespiratory or  genitourinary complaints at this time.  Her last ERCP with stone extraction  was reportedly in 2002, old records not available to me at the present time.   PAST MEDICAL HISTORY:  1. Open cholecystectomy in 1975 for gallstones.  2. Several hospitalizations for ERCP with stone extraction from the CBD     [more than eight occasions as per the patient, last episode eight years     ago].  3. Two NVDs followed by BTL 28 years ago.  4. Striping of varicose veins on the left leg 11  years ago.  5. Bladder tack four years ago done by Dr. Richarda Overlie, repeated by Dr.     Sheppard Penton in [place] along with a sling last summer.  6. Laparoscopic partial abdominal hysterectomy four years ago by Dr. Richarda Overlie.  7. GERD for the last 15 years.  8. Hypercholesterolemia for the last 12 years.  9. Tachycardia, on Inderal.  10.      Depression for the last one year.   ALLERGIES:  NAPROSYN.   MEDICATIONS:  1. Lipitor.  2. Inderal.  3. Prilosec.  4. Effexor.  5. Premarin.   SOCIAL HISTORY:  She is married and lives with her husband in [place],  [place].  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  She has two grown children.  She denies use of alcohol,  tobacco, or drugs.   FAMILY HISTORY:  Her father died at 69.  She claims he choked in his  sleep.  Her mother died at 41 of complications of lung cancer.  She has a  brother and three sisters who all have hypertension, GERD, and anxiety  disorder.  She has a paternal aunt with breast cancer.  There is no family  history of colon, ovarian, or uterine cancer.   PHYSICAL EXAMINATION:  GENERAL:  A cooperative middle-aged white female in  severe distress because of abdominal pain.  VITAL SIGNS:  Stable vital signs.  Afebrile.  Blood pressure 130/60, pulse  56 per minute, respiratory rate 16.  HEENT:  Pupils are PERRLA.  Extraocular movements are well preserved.  Oropharyngeal mucosa without exudate.  NECK:  Supple.  CHEST:  Clear to auscultation.  S1 and S2 regular.  ABDOMEN:  Soft, with well-healed surgical scar present in the right upper  quadrant from previous open cholecystectomy and laparoscopic scar in the  lower abdomen from a laparoscopic partial abdominal hysterectomy and BTL.  There is diffuse epigastric right upper quadrant and periumbilical  tenderness on palpation.  No rebound or rigidity.  No hepatosplenomegaly, no  masses palpable.  There is no evidence of  hepatosplenomegaly.   LABORATORY DATA:  White count of 9.3, hemoglobin of 13.4, hematocrit 39.1,  platelets 306,000.  Sodium 137, potassium 4.1, chloride 107, CO2 27, BUN 15,  creatinine 0.8, glucose 117, albumin 3.4, AST 84, ALT 44, alkaline  phosphatase 83, total bilirubin 1.3, lipase 30.   ASSESSMENT AND PLAN:  1. Choledocholithiasis in face of an open cholecystectomy done several years     ago, 8 mm common bile duct stone with 12 to 16 mm dilatation of the     common bile duct, intrahepatic ductal dilatation noted on ultrasound done     on admission.  We will cover with Primaxin, give Dilaudid and Phenergan     for symptomatic relief, and plan an endoscopic retrograde     cholangiopancreatography tomorrow.  The patient is allowed ice chips and     Popsicles today.  We will give intravenous Protonix while the patient is     in the hospital.  2. Hypercholesterolemia.  On Lipitor.  3. Tachycardia.  On Inderal.  4. Depression.  On Effexor.  5. Postmenopausal.  On Premarin.   Dr. Melvia Heaps to follow in a.m.  We will keep the patient comfortable  and monitor vital signs and plan an endoscopic retrograde  cholangiopancreatography in the morning as mentioned above.                                               Anselmo Rod, M.D.    JNM/MEDQ  D:  07/17/2003  T:  07/17/2003  Job:  [id number]   cc:   [name], M.D.  [address] [place]  [place] [address]  Fax: [phone]

## 2011-02-01 NOTE — Discharge Summary (Signed)
[facility]  Patient:    Katie [name], Katie [name]                    MRN: [id number] Adm. Date:  [id number] Disc. Date: [id number] Attending:  Mervin Hack Dictator:   Mike Gip, P.A.-C.                           Discharge Summary  ADMITTING DIAGNOSES: 1. [age] year-old white female with recurrent right upper quadrant    and right back pain associated with dark urine and abnormal    ultrasound positive for distal common bile duct stone, symptoms    consistent with recurrent choledocholithiasis. 2. Status post remote cholecystectomy. 3. History of recurrent common bile duct stones requiring several    endoscopic retrograde cholangiopancreatographies and sphincterotomies    last in 1999. 4. Hyperlipidemia. 5. Leukopenia probably secondary to above. 6. Status post hysterectomy. 7. Anxiety.  DISCHARGE DIAGNOSES: 1. Stable status post endoscopic retrograde cholangiopancreatography    with no definite evidence for recurrent common bile duct stone,    question past sludge or stone. 2. Focal area of narrowing at the mid to distal common bile duct    remnant, unchanged from November 1999, most consistent with    stricture. 3. Leukopenia, improving. 4. Other diagnoses as listed above.  CONSULTATIONS:  None.  PROCEDURES: ERCP with Dr. Juanda Chance.  HISTORY OF PRESENT ILLNESS:  Katie [name] is a very nice 65 year old female with history of cholecystectomy in 1975.  She is status post multiple ERCPs and stone extractions since that time secondary to recurrent choledocholithiasis. She last had ERCP in 1999, also had a procedure in 1997, 1996, and 1982.  She has a history of GERD, hyperlipidemia, bladder tack in July of 2000, is status post hysterectomy and has a history of anxiety.  The patient said she had been doing well since 1999 and has been maintained on Actigall.  She said a couple of months ago she started having some twinges of right back pain  which reminded her of her prior stone problems.  Then over this past weekend, she became "sick" with fever up to 102 associated with chills, weakness, and right upper quadrant abdominal pain.  She says that her urine had also turned dark over the past few days.  She did not have any associated diarrhea or melena. She had seen Dr. Hetty Ely on July 30, had undergone abdominal ultrasound at [facility] which showed status post cholecystectomy common bile duct with what appeared to be a mobile filling defect 1 cm in diameter in the distal duct, and ERCP was recommended.  Liver and pancreas as well as spleen appeared to be normal as did the kidneys.  Laboratories drawn on that same day showed a WBC of 2.2, hemoglobin 13.8, hematocrit of 40, platelet count of 180, BUN 11, creatinine 1.0.  Liver function studies entirely normal with total bilirubin of 1.2, alk phos of 131, SGOT of 40, SGPT of 33, and GGT of 41, amylase was 47, lipase was 56.  The patient was then referred to Dr. Lina Sar and seen on the day of admission on August 1, and felt that admission was warranted for repeat ERCP.  CURRENT MEDICATIONS: 1. Premarin 0.625 p.o. q.d. 2. Prilosec 20 mg p.o. q.d. 3. Actigall 300 b.i.d. 4. BuSpar 5 mg q.d. 5. Flexeril 10 mg q.hs. p.r.n. 6. Lipitor 20 mg q.d.  ALLERGIES:  No known drug allergies.  REVIEW OF  SYSTEMS:  CARDIOVASCULAR:  Denies any chest pain or anginal symptoms.  PULMONARY:  Negative for cough, shortness of breath, or sputum production.  GENITOURINARY:  Pertinent for dark urine recently.  No dysuria or urgency.  GI:  As above.  LABORATORIES:  Laboratories drawn on admission showed a WBC of 3.2, hemoglobin 12.6, hematocrit of 35.0, MCV of 86, protime 13.3, INR 1.1, PTT 25.  Liver function studies normal with a bilirubin of 0.9, SGOT 29, SGPT 29, and alk phos of 87, albumin 3.  Repeat labs drawn on August 2 showed a WBC of 3.3, hemoglobin 10.8, hematocrit 30.9, and liver  function studies normal.  HOSPITAL COURSE:  The patient was admitted to the service of Dr. Lina Sar after being evaluated in the office with concern for cholangitis and possible sepsis.  She was placed on IV Cipro, had baseline labs drawn, and scheduled for an ERCP on the afternoon of admission.  ERCP was completed by Dr. Juanda Chance. She was noted to have a dilated common bile duct at 12-13 mm.  There was no stone identified.  She did have some pneumophila and was felt to have a functioning sphincterotomy.  She did have balloon sweep of the duct with 8.5 and 12 mm balloon.  The patient tolerated the procedure without difficulty. She was kept overnight for observation and continued on IV Cipro.  The following morning, the patient was stable, afebrile with a temperature of 97 with no complaints of significant abdominal pain and able to tolerate POs. Her liver studies were normal, and it was felt she was stable for discharge to home today.  She is to follow-up with Dr. Lina Sar in four to six weeks, sooner as needed.  Follow-up with Dr. Hetty Ely p.r.n.  DIET:  Regular, low-fat given hyperlipidemia.  DISCHARGE MEDICATIONS: 1. Cipro 500 mg p.o. b.i.d. x 7 days. 2. Other medications same as on admission including Premarin 0.625 p.o. q.d. 3. Prilosec 20 q.d. 4. Actigall 300 b.i.d. 5. BuSpar 5 mg q.d. 6. Flexeril 10 q.h.s. p.r.n. 7. Lipitor 20 q.d.  CONDITION ON DISCHARGE:  Stable, improved.  Will check urinalysis prior to discharge to assure that this is normal.  Will culture if abnormal given current lack of definitive finding at the time of ERCP. DD:  04/17/00 TD:  04/18/00 Job: [id number]

## 2011-02-01 NOTE — H&P (Signed)
NAMEKAYLINE, SHEER NO.:  [id number]   MEDICAL RECORD NO.:  [id number]          PATIENT TYPE:  INP   LOCATION:  4731                         FACILITY:  [facility]   PHYSICIAN:  Arvilla Meres, M.D. LHCDATE OF BIRTH:  1946-08-13   DATE OF ADMISSION:  02/19/2006  DATE OF DISCHARGE:                                HISTORY & PHYSICAL   SUMMARY OF HISTORY:  [name] is a 65 year old white female who describes  at least a five-month history of left anterior chest discomfort.  She  describes this as sharp as well as a dull pressure which can occur one to  five times a day, but lasts less than five minutes.  She states that this  occurs any time.  She does not know specific alleviating/aggravating factors  but it does seem to be slightly worse with stress.  She does notice some  shortness of breath and diaphoresis, but denies nausea and vomiting  associated with the discomfort.  When she gets the discomfort she tries to  relax.  This past Monday evening she was able to sleep but throughout the  night sensed something was wrong.  She continued to have intermittent chest  discomfort and when she woke up Tuesday morning she states that her chest  was sore as well as her jaw.  Throughout Tuesday she went on a school field  trip and continued to feel weak with intermittent shortness of breath,  diaphoresis, and chest discomfort.  After her school field trip she sought  evaluation at Urgent Care in [place].  She was referred to the emergency  room; however, she did not go for further evaluation.  This morning she  walked into our office hoping to see Dr. Eden Emms.  She was transported via  EMS to [facility] Emergency Room for further evaluation of her symptoms.  She denies prior occurrences prior to five months ago and denies pleuritic  component.   PAST MEDICAL HISTORY:  Allergies include no known drug allergies.   MEDICATIONS PRIOR TO ADMISSION:  1.  Inderal 40 mg b.i.d.  2.  Vytorin 10/40 q.h.s.  3.  Nexium 40 b.i.d.  4.  Actigall 300 mg b.i.d.  5.  Effexor 75 mg daily.   MEDICAL HISTORY:  1.  Notable for hyperlipidemia.  2.  Venous insufficiency with bilateral lower extremity vein strippings in      1994.  ABIs in October 2004 were 1 and 1.1 on the right and left,      respectively.  3.  History of tachy palpitations with a negative event monitor in the past.  4.  GERD.  5.  IBS.  6.  Last stress test was in December 2003 that showed an EF of 69%, no      ischemia.  7.  She denies any history of diabetes, hypertension, myocardial infarction,      CVA, COPD, bleeding dyscrasias, or thyroid dysfunction.  8.  Medical history also is notable for a right breast biopsy in August      2003, cholecystectomy and appendectomy in 1975, hysterectomy, bilateral  tubal ligation, several bladder tacks, hand and foot surgery.   SOCIAL HISTORY:  She resides in [place] with her husband.  She is  employed as a Runner, broadcasting/film/video and a Games developer.  She has two children, six  grandchildren, and no great-grandchildren.  She denies any tobacco, alcohol,  drugs, herbal medication use, or specific diet.  She rarely exercises.   FAMILY HISTORY:  Her mother died at age of 74 with lung cancer, her father  at the age of 62 with aspiration during his sleep.  She has one brothers,  three sisters alive and well.   REVIEW OF SYSTEMS:  In addition to above is notable for sinus congestion,  glasses, hearing loss in the left ear, occasional palpitations which she  describes as flutter or skipping three to four times a week.  These are not  prolonged and unassociated with symptoms.  Postmenopausal, anxiety,  arthralgias in her hands.  According to Dr. Gala Romney she has been under an  increased amount of stress as that her son is getting divorced and she is  having to raise their grandchildren.   PHYSICAL EXAMINATION:  GENERAL:  Well-nourished, well-developed pleasant  white female  in no apparent distress.  VITAL SIGNS:  Temperature 97.5, blood pressure 136/64, pulse 52 and regular,  respirations 20 and regular, 98% saturation on room air.  HEENT:  Unremarkable.  NECK: Supple without thyromegaly, adenopathy, JVD, or carotid bruits.  CHEST:  Symmetrical excursion.  Lungs were clear to auscultation.  HEART:  PMI is not displaced.  Slow, regular rate and rhythm.  Do not  appreciate any murmurs, rubs, clicks, or gallops.  All pulses are  symmetrical and equal.  Do not appreciate any abdominal or femoral bruits.  SKIN/INTEGUMENT:  Intact without recent rashes or lesions.  ABDOMEN:  Slightly rounded.  Bowel sounds present without organomegaly,  masses, or tenderness.  EXTREMITIES:  Negative cyanosis, clubbing, or edema.  MUSCULOSKELETAL:  Unremarkable.  NEUROLOGIC:  Unremarkable.   Chest x-ray shows no active disease.  EKG shows sinus bradycardia with a  ventricular rate of 53, normal axis, normal intervals, early R-wave, no old  EKGs available for comparison.  H&H 13.3 and 38.1, normal indices, platelets  259, WBC 4.8.  Sodium 138, potassium 4.8, BUN 11, creatinine 0.8, glucose  102.  Normal LFTs.  Initial CK-MB, relative index, and troponin are within  normal limits.  PTT 26, PT 13.   IMPRESSION:  1.  Atypical chest discomfort.  2.  Anxiety history per past medical history as described above.   DISPOSITION:  Dr. Gala Romney reviewed the patient's history, spoke with and  examined the patient and agrees with the above.  He does not feel that this  is cardiac-related.  We will admit her to the hospital for a 23-hour  observation  to rule out myocardial infarction.  If she rules out for myocardial  infarction she will have a stress Myoview performed in our office on February 20, 2006 at 10 a.m.  She will follow up with Dr. Eden Emms on March 07, 2006 at 4:15  p.m.  If her enzymes are abnormal, we will proceed with cardiac catheterization.      Joellyn Rued, P.A.  [facility]      Arvilla Meres, M.D. [facility]  Electronically Signed    EW/MEDQ  D:  02/19/2006  T:  02/19/2006  Job:  [id number]   cc:   Charlton Haws, M.D.  [address]  [address]  [place]  [place] [address]   ?  Sower, M.D.   Barbette Hair. [name], M.D. [facility]  [address]  [place]  [place] [address]

## 2011-02-01 NOTE — Assessment & Plan Note (Signed)
[facility]                           GASTROENTEROLOGY OFFICE NOTE   Katie, [name]                        MRN:          [id number]  DATE:05/06/2006                            DOB:          Jun 06, 1946    PROBLEM:  Choledocholithiasis.   [name] has returned following her ERCP and stone extraction from April 02, 2006.  A large bile duct stone was seen.  This was crushed with a basket  lithotripter and stone fragments were extracted.  Since that time, she has  felt well.  She has no GI complaints.   PHYSICAL EXAMINATION:  VITAL SIGNS:  Pulse 64, blood pressure 122/60.  Weight 138.   IMPRESSION:  Recurrent choledocholithiasis with a mid bile duct stricture  that is widely patent.   RECOMMENDATIONS:  1. Discontinue Actigall, since this has been ineffective.  2. Trial of phenobarbital 15 mg a day.                                   Barbette Hair. [name], MD, [facility]   RDK/MedQ  DD:  05/06/2006  DT:  05/07/2006  Job #:  [id number]

## 2011-02-01 NOTE — H&P (Signed)
[facility]  Patient:    Katie [name], Katie [name]                          MRN: [id number] Adm. Date:  04/16/00 Attending:  Hedwig Morton. Juanda Chance, M.D. [facility] Dictator:   Amy Monica Becton, P.A.                         History and Physical  CHIEF COMPLAINT:  Right upper quadrant, right back pain, with fever and chills x 4 days.  HISTORY OF PRESENT ILLNESS:  Katie [name] is a very nice 65 year old white female known to the GI service, a primary patent of Dr. _____, who interestingly has a history of recurrent choledocholithiasis despite cholecystectomy in 1975 and multiple ERCPs and stone extractions since that time.  She last underwent ERCP and stone extraction in 1999 per Dr. Russella Dar and at that time was found to have a mildly dilated postcholecystectomy cholangiogram and a smooth mid-common bile duct stricture and a defect consistent with stone up in the right intrahepatic duct.  She had a balloon pull-through done at that time.  She initially had ERCP in 1982 and had also had procedures in 1995, 1996, 1997, and 1998 for recurrent stones.  She says she has done well since 1999 until a couple of months ago, when she began having some nagging intermittent right back pain.  Over this past weekend, she developed fever and chills with temperature up to 102 at home, recurrent right back pain and right upper quadrant pain, and dark urine.  She says she woke up that morning feeling very weak and lightheaded and has not felt well since that time.  She did go see Dr. _____ on Monday, July 30, had abdominal ultrasound done since that time showing status post cholecystectomy with common bile duct showing a mobile filling defect measuring up to 1 cm in the distal duct.  Hepatic radicles were nondilated.  Labs showed a WBC of 2.2, hemoglobin 13.8, hematocrit of 40.4, platelet count of 180.  Electrolytes within normal limits. Liver function studies were normal.  Amylase and lipase were 47 and 56,  respectively.  The patient was then referred to Dr. Juanda Chance today, seen and evaluated in the office, and is admitted to the hospital with probable recurrent choledocholithiasis for repeat ERCP and stone extraction.  MEDICATIONS:  Premarin 0.625 mg p.o. q.d., Prilosec 20 mg p.o. q.d., Actigall 300 mg b.i.d., BuSpar 5 mg p.o. q.d., Flexeril 10 mg p.o. q.h.s., Lipitor 20 mg p.o. q.d.  ALLERGIES:  No known drug allergies.  PAST MEDICAL HISTORY: 1. Status post bladder tack in July 2000. 2. Status post cholecystectomy in 1975.  She is status post multiple ERCPs and    stone extractions secondary to recurrent choledocholithiasis since that    time.  Last procedure done in 1999.  Also had a procedure in 1997, 1996,    and 1982. 3. GERD. 3. Hyperlipidemia. 4. Anxiety. 5. Bilateral bunionectomies. 6. Status post hysterectomy.  FAMILY HISTORY:  Father deceased secondary to complications of choking. Mother deceased at 41 secondary to lung CA.  SOCIAL HISTORY:  The patient is a Research officer, trade union and Orthoptist.  No tobacco, no ETOH, and lives with her husband.  She has two children and five grandchildren.  REVIEW OF SYSTEMS:  CARDIOVASCULAR:  Denies any chest pain or anginal symptoms.  PULMONARY:  Negative for cough, shortness of breath, or sputum production.  GENITOURINARY:  Pertinent for dark urine.  GI:  As outlined above.  PHYSICAL EXAMINATION:  GENERAL:  A well-developed white female in no acute distress.  She is alert and oriented x 3.  VITAL SIGNS:  Blood pressure 90/60, pulse 78, weight is 137.  Temperature is 99.  HEENT:  Anicteric.  Skin is somewhat sallow-appearing.  NECK: No JVD or bruit.  CARDIOVASCULAR:  Regular rate and rhythm with S1 and S2, no murmur, rub, or gallop.  PULMONARY:  Clear to A&P.  ABDOMEN:  Soft.  Bowel sounds are active.  She is tender in the right upper quadrant.  There is no guarding or rebound, no mass or  hepatosplenomegaly. She does have a large cholecystectomy scar.  EXTREMITIES:  Without clubbing, cyanosis, or edema.  NEUROLOGIC:  Grossly nonfocal.  LABORATORY DATA:  Labs today are pending.  IMPRESSION: 50. A 65 year old white female with recurrent right upper quadrant, right back    pain, fever and chills x 4 days, with ultrasound finding of stone in the    distal common bile duct consistent with recurrent choledocholithiasis. 2. History of recurrent common bile duct stones despite remote cholecystectomy    and several ERCPs and sphincterotomies. 3. Hyperlipidemia. 4. Leukopenia secondary to above, rule out sepsis. 5. Other problems as outlined.  PLAN:  The patient is admitted to the service of Dr. Lina Sar for IV fluids.  She will be covered with IV Cipro.  Will plan ERCP and sphincterotomy with stone extraction later this afternoon.  Will plan to continue her Actigall.  For further details, please see the orders. DD:  04/16/00 TD:  04/17/00 Job: [id number]

## 2011-02-01 NOTE — Op Note (Signed)
   Katie [name], Katie [name]                       ACCOUNT NO.:  [id number]   MEDICAL RECORD NO.:  [id number]                   PATIENT TYPE:  AMB   LOCATION:  DSC                                  FACILITY:  [facility]   PHYSICIAN:  Douglas A. Magnus Ivan, M.D.           DATE OF BIRTH:  Jan 19, 1946   DATE OF PROCEDURE:  05/11/2002  DATE OF DISCHARGE:  05/11/2002                                 OPERATIVE REPORT   PREOPERATIVE DIAGNOSIS:  Suspicious microcalcifications, right breast.   POSTOPERATIVE DIAGNOSIS:  Suspicious microcalcifications, right breast.   PROCEDURE:  Needle-localized right breast biopsy.   SURGEON:  Douglas A. Magnus Ivan, M.D.   ANESTHESIA:  1% lidocaine and monitored anesthesia care.   ESTIMATED BLOOD LOSS:  Minimal.   DESCRIPTION OF PROCEDURE:  The patient was brought to the operating room and  properly identified.  She was placed supine on the operating table, and  anesthesia was induced.  Her right breast was then prepped and draped in the  usual sterile fashion.  The needle localization wire had been placed under  mammography and an X had been placed over the top of the area where the  needle was located.  The skin of the breast was then anesthetized with 1%  lidocaine.  The small transverse incision was made then made with a 15  blade.  The incision was carried down into the breast tissue with  electrocautery.  Superior and inferior skin flaps were then created with the  electrocautery.  A large area of breast tissue was then dissected our  circumferentially around the guidewire with the electrocautery.  The  incision was carried down to the chest all.  The entire specimen was then  completely excised, containing the guidewire, and was sent to mammography.  Mammography confirmed the suspicious calcifications were present in the  specimen.  At this point the wound was then thoroughly irrigated with normal  saline.  Hemostasis was achieved with the cautery.  The  subcutaneous layer  was then closed with interrupted 3-0 Vicryl suture and the skin was closed  with a running 4-0 Monocryl.  Steri-Strips, gauze, and tape were then  applied.  The patient tolerated the procedure well.  All sponge, needle, and  instrument counts were correct at the end of the procedure.  The patient was  then taken in stable condition from the operating room to the recovery room.                                               Douglas A. Magnus Ivan, M.D.    DAB/MEDQ  D:  05/11/2002  T:  05/13/2002  Job:  [id number]

## 2011-02-01 NOTE — Assessment & Plan Note (Signed)
[facility]                              CARDIOLOGY OFFICE NOTE   KIEREN, RICCI                        MRN:          [id number]  DATE:05/22/2006                            DOB:          [birth year]    Return office visit for lipid clinic.   PAST MEDICAL HISTORY:  Hyperlipidemia, gastroesophageal reflux disease,  history of tachycardia and gallstones.   MEDICATIONS:  Nexium 40 mg twice daily, Effexor 75 mg daily, Inderal 25 mg  twice daily, Vytorin 10/40 mg daily.   PHYSICAL EXAMINATION:  VITAL SIGNS:  Weight 141 pounds.  Blood pressure  116/70, heart rate 70.   LABORATORY DATA:  Total cholesterol 187, triglyceride 142, HDL 52, LDL 107.  LFTs within normal limits.   ASSESSMENT:  Ms. Guizar is a very pleasant 65 year old woman who comes to the  lipid clinic today with no chest pain, no shortness of breath, no muscle  aches or pains.  She has been compliant with her Vytorin therapy for  approximately one year.  She had chest pain in June of this year where she  was sent to the emergency department and had Cardiolite which was negative  for ischemia.  She does not smoke any cigarettes.  She does not have  documented coronary artery disease.  She does not have hypertension.  She  has a family history of hyperlipidemia; however, there had been no acute  myocardial infarctions in her family.  Therefore, her total cholesterol is  at goal of less than 200.  Triglycerides are goal of less than 150.  HDL  goal of greater than 40.  LDL goal of less than 130; however, we will  continue to strive for a goal as close to 100 as possible.  A non-HDL is  just over goal of less than 130.  She had previously been exercising by  walking on a treadmill at the gym several times a week; however, she got out  of that habit.  She will restart.  She eats a very heart-healthy diet.   PLAN:  1. Continue current medication regimen.  2. Restart exercise regimen a  minimum of three times a week for 30 minutes      at a time and continue heart-healthy diet.  3. Follow up in six months for lipid panel, LFTs and make medication      changes as needed in the future.                                 Leota Sauers, PharmD                            Jesse Sans. Daleen Squibb, MD, [facility]   LC/MedQ  DD:  05/22/2006  DT:  05/23/2006  Job #:  [id number]

## 2011-02-01 NOTE — Discharge Summary (Signed)
NAMEKYIESHA, MILLWARD                       ACCOUNT NO.:  [id number]   MEDICAL RECORD NO.:  [id number]                   PATIENT TYPE:  INP   LOCATION:  5742                                 FACILITY:  [facility]   PHYSICIAN:  Wilhemina Bonito. Marina Goodell, M.D. [facility]             DATE OF BIRTH:  Feb 02, 1946   DATE OF ADMISSION:  DATE OF DISCHARGE:  03/27/2002                                 DISCHARGE SUMMARY   ADMISSION DIAGNOSES:  Severe epigastric and back pain in patient with  history of recurrent choledocholithiasis.  Status post cholecystectomy in  1975.  Gastroesophageal reflux disease.  Irritable bowel syndrome.  Status  post varicose vein stripping left leg.  Status post hysterectomy.  Bladder  suspension procedures times 2 most recently 12/2001.  Status post bilateral  bunionectomies.  History of tachyarrhythmia.  Asthmatic bronchitis.  Status  post bilateral tubal ligation.  Anxiety and depression.   DISCHARGE DIAGNOSES:  Recurrent obstruction of bile duct with sludge.  Status post ERCP with balloon pull-through and clearance of sludge from  common bile duct.   PROCEDURES:  ERCP with balloon pull-through and sludge clearance from bile  duct on 03/26/2002 by Dr. Stan Head.   CONSULTATIONS:  None.   HISTORY OF PRESENT ILLNESS:  Ms. Petraglia is a pleasant 65 year old white  female with the above noted past medical history.  Significantly she has had  problems with recurrent common bile duct stone requiring several ERCPs and  stone and/or sludge extraction over the past several years.  Latest ERCP had  been performed in 04/2000 at which time common bile duct was mildly dilated  but she did not have any obstruction or stones.  She did undergo bile duct  sweep at that time.  She takes Actigall chronically.  The patient had been  seen Sunday 07/06 at the [facility] Emergency Room for recurrent  back pain reminiscent of prior episodes of obstructing choledocholithiasis.  Labs at that  time were unremarkable.  The pain eased off so she was  discharged to home.  On the evening prior to this admission of 07/10 she  developed recurrent severe 10/10 back pain which lasted about an hour and  then eased off.  It recurred in the wee hours of the morning but was not  associated with any nausea, vomiting, chills, or other constitutional  symptoms.  When the pain recurred again mid-morning she came to the  emergency room.  LFTs, amylase, and lipase were within normal limits.  She  was admitted for further evaluation of what was felt to be possible  recurrent choledocholithiasis.   LABORATORY DATA:  White blood cell count 7.9, hemoglobin 12.6, hematocrit  35.5,  MCV 91.1, platelets 290,000.  Sodium 136, potassium 4.0, glucose 95,  BUN 14, creatinine 0.8, total bilirubin 0.6, alkaline phosphatase 63, AST  26, ALT 22, albumin 3.3, amylase 38, lipase 20.  Imaging studies:  She had  fluoroscopy during  ERCP.   HOSPITAL COURSE:  The patient was admitted for IV analgesics and antiemetics  along with IV fluids.  Diet was limited to clear liquids.  She was continued  on her outpatient oral medications.   Dr. Leone Payor performed ERCP late morning of hospital day #2, 07/11.  He did  extract some sludge.  That afternoon the patient was having burning  sensation in the intra-abdominal region, but the severe visceral pain that  she had been experiencing had not recurred.  She was also a little bit  queasy.  By hospital day 3 she was feeling well.  Diet had been advanced  from clears to a low fat diet all of which she tolerated and she was  discharged to home on 07/12 in stable and improved condition.  As for  followup she was to return to see Dr. Lina Sar, her primary GI care  Lizzy Hamre, the following Monday, 07/15, as previously scheduled.   DISCHARGE INSTRUCTIONS:  Prilosec 20 mg one p.o. q.d., Premarin 0.625 mg  p.o. q.d., Lipitor 20 mg p.o. q.d., Actigall 300 mg p.o. b.i.d., Inderal 10   mg p.o. b.i.d., Advair 100/50 inhaled twice daily, Effexor 75 mg p.o. q.d.,  Robinul forte 2 mg b.i.d. for intestinal spasm.  Diet:  Low fat.  Activity  as tolerated.     SARA GRIFFIN, PAC                         John N. Marina Goodell, M.D. [facility]    SG/MEDQ  D:  04/20/2002  T:  04/24/2002  Job:  [id number]   cc:   Joanne Gavel, M.D.

## 2011-02-01 NOTE — Assessment & Plan Note (Signed)
Katie [name]                            CARDIOLOGY OFFICE NOTE   Katie, [name]                        MRN:          [id number]  DATE:10/29/2006                            DOB:          1946/02/25    Katie [name] returns today for followup.  She is 65 years old.  She has had  previous atypical chest pain and shortness of breath.  She had a  negative Myoview, I believe in June of 2007.  She is doing better.  Her  whole situation with her family has improved.  She has moved into her  log cabin in [place] and her son's divorce is going through.  He has  custody of his children four days a week.   She seems to be in brighter spirits.  However, she has a URI.  She has  been coughing and producing some phlegm.  There has been no fever.  She  thinks she has been having some chest pains because of her coughing.  Otherwise, she is doing well.  There has been no PND or orthopnea, no  syncope, no palpitations.   MEDICATIONS INCLUDE:  1. Nexium 40 b.i.d.  2. Effexor 75 a day.  3. Vytorin 10/40.  4. Inderal 20 b.i.d.   PHYSICAL EXAMINATION:  Her exam is remarkable for a blood pressure of  128/70, pulse 62 and regular.  HEENT:  Normal.  LUNGS:  Clear.  There are no rales or rubs.  There is an S1, S2, normal heart sounds.  ABDOMEN:  Benign.  LOWER EXTREMITIES:  Intact pulses, no edema.   EKG is normal.   IMPRESSION:  Atypical chest pain with normal Myoview less than a year  ago; cough and URI.  The patient does not need a followup echo or  Myoview at this time.  We will give her prescriptions for a Z-Pak.  We  will check a chest x-ray today, partly at the patient's request, and  partly to rule out atypical pneumonia.  She will then follow up with her  primary care M.D.  Overall, I think her cardiac status is in good shape.  We will continue her Vytorin for hypercholesterolemia.  Her LFTs were  normal in September of 2007.  They can be followed up in a  year.  She  will continue her Inderal for her palpitations, as this seems to have  been effective.  Her EKG was normal today, which is reassuring.     Katie [name]. Katie Emms, MD, [facility]  Electronically Signed   PCN/MedQ  DD: 10/29/2006  DT: 10/29/2006  Job #: [id number]

## 2011-02-01 NOTE — Assessment & Plan Note (Signed)
[facility]                               LIPID CLINIC NOTE   [name], Katie [name]                        MRN:          [id number]  DATE:02/17/2008                            DOB:          March 13, 1946    Date and time of phone call was February 17, 2008 at 4:15.   I received a call from [name] regarding her lab work that was  completed at [facility] on Feb 05, 2008.  I did mail a copy of this to  her.  It includes a total cholesterol 168, triglycerides 162, HDL 39,  LDL 97.  The patient's labs meet primary and secondary goals for lipid-  lowering therapy at this time.   She requests reduction of lipid therapy to plain simvastatin at 40 mg  due to need for generic from a cost-saving perspective.  I have  tentatively okayed this.  We have sent a prescription at patient's  request to [facility] in [facility] for simvastatin 40 mg tablets 1 daily in the  evening for cholesterol with three refills.   I have mailed a prescription to the patient at her home address today at  [name] in [place] in order for her to have her  labs drawn on or around the 1st of September at the [facility] office  with those results to come to me.  She will call with questions or  problems.  In the meantime, she is to monitor for muscle aches or pains,  weakness, fatigue, or other problems, but I am hopeful she will tolerate  it, as this is a reduction in her therapy, and I have some level of  concern that this may not be as effective.  I have reinforced with her  that she must be very compliant with this medication and also pay  attention to diet, exercise, and be compliant with her fish oil as well  in order to give it every chance possible to work.  She verbalizes  understanding.  Will call with questions or problems in the meantime.      Shelby Dubin, PharmD, BCPS, CPP  Electronically Signed      Rollene Rotunda, MD, [facility]  Electronically Signed   MP/MedQ  DD: 02/17/2008  DT: 02/17/2008  Job #: [id number]   cc:   Arta Silence, MD

## 2011-02-01 NOTE — H&P (Signed)
Katie [name], KLAR NO.:  [id number]   MEDICAL RECORD NO.:  [id number]          PATIENT TYPE:  OIB   LOCATION:  5524                         FACILITY:  [facility]   PHYSICIAN:  Barbette Hair. [name], MD,FACGDATE OF BIRTH:  1946-02-25   DATE OF ADMISSION:  04/02/2006  DATE OF DISCHARGE:                                HISTORY & PHYSICAL   HISTORY:  The patient is a 65 year old white female admitted following ERCP  and stone extraction.  She has a history of recurrent bout of stones.  She  is status post sphincterotomy in the past.  She has a known bile duct  stricture as well.  Today she developed severe abdominal pain reminiscent of  her previous pain from bile duct stones.  She underwent an ERCP that  demonstrated an 18 mm stone and a previously described biliary stricture.  The stone was fragmented with the basket lithotripter and the fragments were  removed from the bile duct.   PAST MEDICAL HISTORY:  Pertinent for ASCVD.   MEDICATIONS:  She is on no medications.   ALLERGIES:  SHE HAS NO ALLERGIES.   EXAMINATION:  GENERAL:  She is a now sleeping female.  VITAL SIGNS:  Stable.  EYES:  She is anicteric.  CHEST:  Clear.  There are no cardiac murmurs, gallops, or rubs.  ABDOMEN:  On abdominal exam she has mild right upper quadrant tenderness  without guarding or rebound.  There are no abdominal masses or organomegaly.   IMPRESSION:  Recurrent bile duct stones - status post endoscopic retrograde  cholangiopancreatography with stone removal.   RECOMMENDATIONS:  1. Cipro 500 mg b.i.d.  2. Clear liquid diet.  Advance as tolerated.      Barbette Hair. [name], MD,FACG  Electronically Signed     RDK/MEDQ  D:  04/02/2006  T:  04/02/2006  Job:  [id number]   cc:   Arta Silence, MD

## 2011-02-06 ENCOUNTER — Encounter: Payer: Self-pay | Admitting: Family Medicine

## 2011-02-13 ENCOUNTER — Encounter: Payer: Self-pay | Admitting: Family Medicine

## 2011-02-13 ENCOUNTER — Ambulatory Visit (INDEPENDENT_AMBULATORY_CARE_PROVIDER_SITE_OTHER): Payer: BC Managed Care – PPO | Admitting: Family Medicine

## 2011-02-13 DIAGNOSIS — F329 Major depressive disorder, single episode, unspecified: Secondary | ICD-10-CM

## 2011-02-13 DIAGNOSIS — E538 Deficiency of other specified B group vitamins: Secondary | ICD-10-CM

## 2011-02-13 DIAGNOSIS — D510 Vitamin B12 deficiency anemia due to intrinsic factor deficiency: Secondary | ICD-10-CM

## 2011-02-13 DIAGNOSIS — E782 Mixed hyperlipidemia: Secondary | ICD-10-CM

## 2011-02-13 DIAGNOSIS — K8050 Calculus of bile duct without cholangitis or cholecystitis without obstruction: Secondary | ICD-10-CM

## 2011-02-13 DIAGNOSIS — F32A Depression, unspecified: Secondary | ICD-10-CM

## 2011-02-13 LAB — LIPID PANEL
Cholesterol: 209 mg/dL — ABNORMAL HIGH (ref 0–200)
Total CHOL/HDL Ratio: 4

## 2011-02-13 MED ORDER — VENLAFAXINE HCL 37.5 MG PO TABS: ORAL_TABLET | ORAL | Status: DC

## 2011-02-13 MED ORDER — CYANOCOBALAMIN 1000 MCG/ML IJ SOLN
1000.0000 ug | Freq: Once | INTRAMUSCULAR | Status: AC
  Administered 2011-02-13: 1000 ug via INTRAMUSCULAR

## 2011-02-13 NOTE — Patient Instructions (Addendum)
Will report labwork on phone tree. Increase Effexor to 2 in AM, 1 at night. RTC if sxs worsen or don't improve.

## 2011-02-13 NOTE — Progress Notes (Signed)
Addended by: Baldomero Lamy on: 02/13/2011 10:38 AM   Modules accepted: Orders

## 2011-02-13 NOTE — Progress Notes (Signed)
Addended by: Sydell Axon C on: 02/13/2011 11:12 AM   Modules accepted: Orders

## 2011-02-13 NOTE — Progress Notes (Signed)
  Subjective:    Patient ID: Katie [name], female    DOB: March 29, 1946, 65 y.o.   MRN: [id number]  HPI Pt here as acute appt for needing 2 or 3 things done. She has a Gyn, Dr Marcelle Overlie. She would like her chol checked. She wonders if she moight havfe H. Pylori and would like a blood test done.  She is also desiring to have her Effexor increased as she has lots of aggravation and feels she needs extra help. She and her husband are getting along ok. HE stopped his Zoloft and is probably depressed. HE has an  appt to see a Psychologist next month.    Review of Systems  Constitutional: Negative for fever, chills, diaphoresis, activity change and fatigue.  HENT: Negative for ear pain, congestion, rhinorrhea and postnasal drip.   Eyes: Negative for redness.  Respiratory: Negative for cough, chest tightness, shortness of breath and wheezing.   Cardiovascular: Negative for chest pain.  Gastrointestinal: Negative for nausea, vomiting, abdominal pain and abdominal distention.       Has had generalized abd pain off and on, none today.       Objective:   Physical Exam        Assessment & Plan:

## 2011-02-13 NOTE — Assessment & Plan Note (Signed)
Hasn't been checked in a while. Will get CBC.

## 2011-02-13 NOTE — Assessment & Plan Note (Signed)
Not checked in quite a while. Is seen very frequently for gallstones and therefore does not come in for routine care. Report on phone tree.

## 2011-02-13 NOTE — Assessment & Plan Note (Signed)
More stress lately and feeling down. Requesting increase in her medication. Has been on Effexor for long time and tolerates it well. Incease 2 in AM 1 at night from 1 bid.

## 2011-03-07 ENCOUNTER — Other Ambulatory Visit: Payer: Self-pay | Admitting: Family Medicine

## 2011-03-19 ENCOUNTER — Other Ambulatory Visit: Payer: Self-pay | Admitting: Family Medicine

## 2011-03-19 NOTE — Telephone Encounter (Signed)
Received refills electronically from pharmacy. Is it okay to refill these?

## 2011-03-21 ENCOUNTER — Other Ambulatory Visit: Payer: Self-pay | Admitting: Family Medicine

## 2011-04-05 ENCOUNTER — Ambulatory Visit (HOSPITAL_COMMUNITY)
Admission: RE | Admit: 2011-04-05 | Discharge: 2011-04-05 | Disposition: A | Discharge status: Home / Self Care | Payer: BC Managed Care – PPO | Source: Ambulatory Visit | Attending: Internal Medicine | Admitting: Internal Medicine

## 2011-04-05 ENCOUNTER — Ambulatory Visit (HOSPITAL_COMMUNITY): Payer: BC Managed Care – PPO

## 2011-04-05 ENCOUNTER — Telehealth: Payer: Self-pay | Admitting: Gastroenterology

## 2011-04-05 DIAGNOSIS — K8051 Calculus of bile duct without cholangitis or cholecystitis with obstruction: Secondary | ICD-10-CM

## 2011-04-05 DIAGNOSIS — F329 Major depressive disorder, single episode, unspecified: Secondary | ICD-10-CM | POA: Insufficient documentation

## 2011-04-05 DIAGNOSIS — K219 Gastro-esophageal reflux disease without esophagitis: Secondary | ICD-10-CM | POA: Insufficient documentation

## 2011-04-05 DIAGNOSIS — F3289 Other specified depressive episodes: Secondary | ICD-10-CM | POA: Insufficient documentation

## 2011-04-05 DIAGNOSIS — K831 Obstruction of bile duct: Secondary | ICD-10-CM

## 2011-04-05 MED ORDER — LEVOFLOXACIN 500 MG PO TABS: 500.0000 mg | ORAL_TABLET | Freq: Every day | ORAL | Status: AC

## 2011-04-05 NOTE — Telephone Encounter (Signed)
Pt aware. Instructed pt to be at [facility] around 3:30pm. Spoke with Noreene Larsson in endo, there is an ercp add on at [facility] around 2pm. This will be an on-call case and they hope to be ready around 5pm.

## 2011-04-05 NOTE — Telephone Encounter (Signed)
ERCP and stone extraction successful Will rx Levaquin 500 mg daily x 3 days We should call her 7/23 to recheck her

## 2011-04-05 NOTE — Telephone Encounter (Signed)
She will need an ERCP today - please arrange for about 5 PM at [facility] NPO for now until then Needs Unasyn IV on call per routine orders  Let me know

## 2011-04-05 NOTE — Telephone Encounter (Signed)
Husband has requested an evaluation at [facility] or [facility] - second opinion. I told him we could make a referral to [facility].

## 2011-04-05 NOTE — Telephone Encounter (Signed)
Pt states she has had about 4 instances this week where she has had pain like when she has a gallstone. Last night she said the attack lasted about 4 hours and she had chills. This morning her urine was very dark orange. She has had several ERCP's done to remove gallstones. Dr. Leone Payor as doc of the day please advise.

## 2011-04-08 ENCOUNTER — Encounter: Payer: Self-pay | Admitting: Gastroenterology

## 2011-04-08 NOTE — Telephone Encounter (Signed)
Refer her to Dr. Caralyn Guile.  I will dictate a progress note which you can fax.

## 2011-04-08 NOTE — Telephone Encounter (Signed)
Dr. [name] who would you like for me to refer her to at [facility]? Would it be for recurrent gallstones? Please advise.

## 2011-04-08 NOTE — Progress Notes (Signed)
Mrs. Juncaj is a 65 year old white female status post cholecystectomy and endoscopic sphincterotomy which are recurrent choledocholithiasis over the past 5 years. She's had documented cholangitis and will present with right upper quadrant pain. She's had 6 ERCPs over the past 5 years where a large amount of biliary sludge and stones have been removed. She has a very mild stricture in the midportion of the common bile duct but does not appear to have obstruction from the stricture. The stricture has been dilated in the past. Her most recent presentation was on July 20 where she developed pain.  ERCP demonstrated  food that was in the distal bile duct which was extracted with a balloon stone extractor.  She has been treated in the past with Urso without any significant impact on her production of recurrent stones.  The patient has requested a second opinion for which I am referring her to Dr. Caralyn Guile.

## 2011-04-09 NOTE — Telephone Encounter (Signed)
Referral made to Dr. Caralyn Guile at [facility] [phone], records faxed for review to [phone]. Waiting to hear back regarding appt.

## 2011-04-16 NOTE — Telephone Encounter (Signed)
Pt has appt at [facility] to see Dr. Wyline Mood 06/27/11@12 :30pm. [facility] will mail out appt and information to the pt.

## 2011-04-19 ENCOUNTER — Telehealth: Payer: Self-pay | Admitting: Gastroenterology

## 2011-04-19 NOTE — Telephone Encounter (Signed)
Spoke with Katie [name], her appt with Dr. Wyline Mood is 10/11@12 :30pm. Informed Katie [name] that [facility] is supposed to be sending her information in the mail and a map regarding her appt. Katie [name] aware.

## 2011-06-24 ENCOUNTER — Telehealth: Payer: Self-pay | Admitting: Gastroenterology

## 2011-06-24 NOTE — Telephone Encounter (Signed)
Pt wanted to know if she needed to get her records for her appt at [facility]. Informed pt that records were faxed to [facility] at the time the appt was made.

## 2011-07-22 ENCOUNTER — Telehealth: Payer: Self-pay | Admitting: Gastroenterology

## 2011-07-22 NOTE — Telephone Encounter (Signed)
L/M for pt that Nexium samples are out front for her to pick up.Marland Kitchen    [id number] June 2015

## 2011-07-23 ENCOUNTER — Telehealth: Payer: Self-pay | Admitting: *Deleted

## 2011-07-23 HISTORY — PX: OTHER SURGICAL HISTORY: SHX169

## 2011-07-23 NOTE — Telephone Encounter (Signed)
Prior Berkley Harvey is needed for nexium, form from [employer] is on your desk.  Pt is having surgery today and requested samples yesterday to last her until we hear back from [facility].  One sample box of #5 given- lot number [id number], exp 6/15.  Form came under your name, she has previously been Dr. Lorenza Chick patient.

## 2011-07-23 NOTE — Telephone Encounter (Signed)
I'll address the hard copy.  

## 2011-07-25 ENCOUNTER — Encounter: Payer: Self-pay | Admitting: Family Medicine

## 2011-07-25 NOTE — Telephone Encounter (Signed)
Prior auth given for nexium, advised pharmacy.  Approval letter placed on doctor's desk for signature and scanning. 

## 2011-08-27 ENCOUNTER — Ambulatory Visit (INDEPENDENT_AMBULATORY_CARE_PROVIDER_SITE_OTHER): Payer: BC Managed Care – PPO | Admitting: Family Medicine

## 2011-08-27 ENCOUNTER — Encounter: Payer: Self-pay | Admitting: Family Medicine

## 2011-08-27 VITALS — BP 120/70 | HR 64 | Temp 98.7°F | Ht 63.5 in | Wt 133.8 lb

## 2011-08-27 DIAGNOSIS — J069 Acute upper respiratory infection, unspecified: Secondary | ICD-10-CM

## 2011-08-27 DIAGNOSIS — D510 Vitamin B12 deficiency anemia due to intrinsic factor deficiency: Secondary | ICD-10-CM

## 2011-08-27 MED ORDER — CYANOCOBALAMIN 1000 MCG/ML IJ SOLN
1000.0000 ug | Freq: Once | INTRAMUSCULAR | Status: AC
  Administered 2011-08-27: 1000 ug via INTRAMUSCULAR

## 2011-08-27 MED ORDER — ESOMEPRAZOLE MAGNESIUM 40 MG PO CPDR: 40.0000 mg | DELAYED_RELEASE_CAPSULE | Freq: Two times a day (BID) | ORAL | Status: DC

## 2011-08-27 MED ORDER — HYDROCOD POLST-CHLORPHEN POLST 10-8 MG/5ML PO LQCR: 5.0000 mL | Freq: Two times a day (BID) | ORAL | Status: DC | PRN

## 2011-08-27 NOTE — Patient Instructions (Signed)
This is likely a virus.  I hope you feel better.   Drink lots of fluids.  Treat sympotmatically with Mucinex, nasal saline irrigation, and Tylenol/Ibuprofen.You can use warm compresses.  Cough suppressant at night. Call if not improving as expected in 5-7 days.

## 2011-08-27 NOTE — Progress Notes (Signed)
SUBJECTIVE:  Katie [name] is a 65 y.o. female who complains of coryza, congestion, sneezing, sore throat and productive cough for 3 days. She denies a history of anorexia, chest pain, chills, dizziness, fatigue and shortness of breath and denies a history of asthma. Patient denies smoke cigarettes.  Patient Active Problem List  Diagnoses  . HYPERLIPIDEMIA, MIXED  . ANEMIA, PERNICIOUS  . GERD  . CHOLEDOCHOLITHIASIS  . BILE DUCT STRICTURE  . ABDOMINAL PAIN RIGHT UPPER QUADRANT  . Depression  . URI (upper respiratory infection)   Past Medical History  Diagnosis Date  . Choledocholithiasis     recurrent  . Bile duct stricture    Past Surgical History  Procedure Date  . Foot surgery     both feet  . Vein ligation and stripping     left leg  . Partial hysterectomy   . Bladder surgery     x 2  . Tubal ligation   . Wrist surgery     right  . Breast lumpectomy     right  . Cholecystectomy   . Explor lap, hepatoduodenostomy 07/23/11    Dr Madaline Brilliant, [facility]   History  Substance Use Topics  . Smoking status: Never Smoker   . Smokeless tobacco: Never Used  . Alcohol Use: No   Family History  Problem Relation Age of Onset  . Cancer Mother     lung, smoker  . Alcohol abuse Father   . GER disease Sister   . GER disease Brother   . Depression Sister   . Anxiety disorder Sister   . Hypertension Sister    No Known Allergies Current Outpatient Prescriptions on File Prior to Visit  Medication Sig Dispense Refill  . cyanocobalamin (,VITAMIN B-12,) 1000 MCG/ML injection Inject 1,000 mcg into the muscle every 30 (thirty) days.        Marland Kitchen NEXIUM 40 MG capsule TAKE ONE CAPSULE BY MOUTH TWICE DAILY  60 each  5  . propranolol (INDERAL) 40 MG tablet TAKE ONE TABLET BY MOUTH TWICE DAILY  60 tablet  12  . simvastatin (ZOCOR) 80 MG tablet TAKE ONE TABLET BY MOUTH NIGHTLY AT BEDTIME  30 tablet  10  . ursodiol (ACTIGALL) 300 MG capsule TAKE ONE CAPSULE BY MOUTH TWICE DAILY  60 capsule  12  .  venlafaxine (EFFEXOR) 37.5 MG tablet Two tabs in AM, one in PM.  90 tablet  12   The PMH, PSH, Social History, Family History, Medications, and allergies have been reviewed in [facility], and have been updated if relevant.   OBJECTIVE: BP 120/70  Pulse 64  Temp(Src) 98.7 F (37.1 C) (Oral)  Ht 5' 3.5" (1.613 m)  Wt 133 lb 12 oz (60.669 kg)  BMI 23.32 kg/m2  SpO2 98%  She appears well, vital signs are as noted. Ears normal.  Throat and pharynx normal.  Neck supple. No adenopathy in the neck. Nose is congested. Sinuses non tender. The chest is clear, without wheezes or rales.  ASSESSMENT:  viral upper respiratory illness  PLAN: Symptomatic therapy suggested: push fluids, rest and return office visit prn if symptoms persist or worsen. Lack of antibiotic effectiveness discussed with her. Call or return to clinic prn if these symptoms worsen or fail to improve as anticipated.

## 2011-09-06 ENCOUNTER — Other Ambulatory Visit: Payer: Self-pay | Admitting: Obstetrics and Gynecology

## 2011-09-06 DIAGNOSIS — Z1231 Encounter for screening mammogram for malignant neoplasm of breast: Secondary | ICD-10-CM

## 2011-09-23 ENCOUNTER — Ambulatory Visit: Payer: BC Managed Care – PPO

## 2011-09-26 ENCOUNTER — Ambulatory Visit
Admission: RE | Admit: 2011-09-26 | Discharge: 2011-09-26 | Disposition: A | Discharge status: Home / Self Care | Payer: BC Managed Care – PPO | Source: Ambulatory Visit | Attending: Obstetrics and Gynecology | Admitting: Obstetrics and Gynecology

## 2011-09-26 DIAGNOSIS — Z1231 Encounter for screening mammogram for malignant neoplasm of breast: Secondary | ICD-10-CM

## 2011-10-03 ENCOUNTER — Ambulatory Visit: Payer: BC Managed Care – PPO

## 2011-11-14 ENCOUNTER — Ambulatory Visit (INDEPENDENT_AMBULATORY_CARE_PROVIDER_SITE_OTHER): Payer: BC Managed Care – PPO

## 2011-11-14 DIAGNOSIS — E538 Deficiency of other specified B group vitamins: Secondary | ICD-10-CM

## 2011-11-14 MED ORDER — CYANOCOBALAMIN 1000 MCG/ML IJ SOLN
1000.0000 ug | Freq: Once | INTRAMUSCULAR | Status: AC
  Administered 2011-11-14: 1000 ug via INTRAMUSCULAR

## 2011-12-05 ENCOUNTER — Ambulatory Visit: Payer: BC Managed Care – PPO | Admitting: Family Medicine

## 2011-12-12 ENCOUNTER — Ambulatory Visit: Payer: BC Managed Care – PPO | Admitting: Family Medicine

## 2011-12-31 ENCOUNTER — Ambulatory Visit: Payer: BC Managed Care – PPO

## 2012-01-01 ENCOUNTER — Ambulatory Visit (INDEPENDENT_AMBULATORY_CARE_PROVIDER_SITE_OTHER): Payer: BC Managed Care – PPO | Admitting: *Deleted

## 2012-01-01 DIAGNOSIS — E538 Deficiency of other specified B group vitamins: Secondary | ICD-10-CM

## 2012-01-01 MED ORDER — CYANOCOBALAMIN 1000 MCG/ML IJ SOLN
1000.0000 ug | Freq: Once | INTRAMUSCULAR | Status: AC
  Administered 2012-01-01: 1000 ug via INTRAMUSCULAR

## 2012-02-12 ENCOUNTER — Ambulatory Visit (INDEPENDENT_AMBULATORY_CARE_PROVIDER_SITE_OTHER): Payer: BC Managed Care – PPO | Admitting: *Deleted

## 2012-02-12 DIAGNOSIS — D510 Vitamin B12 deficiency anemia due to intrinsic factor deficiency: Secondary | ICD-10-CM

## 2012-02-12 MED ORDER — CYANOCOBALAMIN 1000 MCG/ML IJ SOLN
1000.0000 ug | Freq: Once | INTRAMUSCULAR | Status: AC
  Administered 2012-02-12: 1000 ug via INTRAMUSCULAR

## 2012-02-20 ENCOUNTER — Other Ambulatory Visit: Payer: Self-pay | Admitting: Family Medicine

## 2012-03-10 ENCOUNTER — Other Ambulatory Visit: Payer: Self-pay | Admitting: Family Medicine

## 2012-03-21 ENCOUNTER — Other Ambulatory Visit: Payer: Self-pay | Admitting: Family Medicine

## 2012-03-27 ENCOUNTER — Other Ambulatory Visit: Payer: Self-pay | Admitting: Family Medicine

## 2012-03-27 NOTE — Telephone Encounter (Signed)
Ok to fill 

## 2012-04-03 ENCOUNTER — Ambulatory Visit (INDEPENDENT_AMBULATORY_CARE_PROVIDER_SITE_OTHER): Payer: BC Managed Care – PPO | Admitting: Family Medicine

## 2012-04-03 ENCOUNTER — Encounter: Payer: Self-pay | Admitting: Family Medicine

## 2012-04-03 VITALS — BP 144/72 | HR 80 | Temp 98.0°F | Wt 142.0 lb

## 2012-04-03 DIAGNOSIS — R519 Headache, unspecified: Secondary | ICD-10-CM | POA: Insufficient documentation

## 2012-04-03 DIAGNOSIS — E538 Deficiency of other specified B group vitamins: Secondary | ICD-10-CM

## 2012-04-03 DIAGNOSIS — R51 Headache: Secondary | ICD-10-CM
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
## 2012-04-03 NOTE — Patient Instructions (Addendum)
Good to see you. We will call you with your lab results. Please stop by to see Shirlee Limerick on your way out to set up your MRI.  Cluster Headache Cluster headaches are recognized by their pattern of deep, intense head pain. They normally occur on one side of the head. Typically, cluster headaches:  Are severe in nature.   Occur repeatedly over weeks to months at a time and are then followed by periods of no headaches.   Can last 15 minutes to 3 hours.   Occur at the same time each day, often at night.   Occur several times a day.  CAUSES The exact cause of a cluster headache is not known. Some things can trigger a cluster headache, such as:  Smoking.   Alcohol use.   Lack of sleep.   High altitude travel, such as airline travel.   Change of seasons.   Certain foods, such as foods or drinks that contain nitrates, glutamate, aspartame, or tyramine.  SYMPTOMS   Severe pain that begins in or around the eye or temple.   One-sided head pain.   Feeling sick to your stomach (nauseous).   Sensitivity to light.   Runny nose.   Eye redness, tearing, and nasal stuffiness on the side of your head where you are experiencing pain.   Sweaty, pale skin of the face.   Droopy eyelid.  DIAGNOSIS  Cluster headaches are diagnosed based on symptoms and physical exam. Your caregiver may order a computerized X-ray scan (CT or CAT scan) or a computerized magnetic scan (MRI) of your head or other lab tests to see if your headaches are caused from other medical conditions.  TREATMENT   Medications for pain relief and to prevent recurrent attacks.   Oxygen for pain relief.   Biofeedback programs may help reduce headache pain.  Some people may need a combination of medicines for cluster headaches. It may be helpful to keep a headache diary. This may help you find a trend for what is triggering your headaches. Your caregiver can develop a treatment plan that can be helpful to you.  HOME CARE  INSTRUCTIONS  During cluster periods:  Follow a regular sleep schedule.Do not vary the amount and time that you sleep from day to day. It is important to stay on the same schedule during a cluster period to help prevent headaches.   Avoid alcohol.   Stop smoking.  SEEK IMMEDIATE MEDICAL CARE IF:   You have any changes from your previous cluster headaches either in intensity or frequency.   You are not getting relief from medicines you are taking.   You faint.   You develop weakness or numbness, especially on one side of your body or face.   You develop double vision.   You develop nausea or vomiting.   You cannot keep your balance or have difficulty talking or walking.   You develop neck pain or neck stiffness.   You have a fever.  Document Released: 09/02/2005 Document Revised: 08/22/2011 Document Reviewed: 12/01/2010 [facility] Patient Information 2012 [place], [place].

## 2012-04-04 LAB — COMPREHENSIVE METABOLIC PANEL
AST: 14 U/L (ref 0–37)
BUN: 17 mg/dL (ref 6–23)
Calcium: 9.3 mg/dL (ref 8.4–10.5)
Chloride: 104 mEq/L (ref 96–112)
Creat: 0.9 mg/dL (ref 0.50–1.10)

## 2012-04-08 ENCOUNTER — Ambulatory Visit
Admission: RE | Admit: 2012-04-08 | Discharge: 2012-04-08 | Disposition: A | Discharge status: Home / Self Care | Payer: BC Managed Care – PPO | Source: Ambulatory Visit | Attending: Family Medicine | Admitting: Family Medicine

## 2012-04-08 ENCOUNTER — Ambulatory Visit (INDEPENDENT_AMBULATORY_CARE_PROVIDER_SITE_OTHER): Payer: BC Managed Care – PPO | Admitting: *Deleted

## 2012-04-08 DIAGNOSIS — R519 Headache, unspecified: Secondary | ICD-10-CM

## 2012-04-08 DIAGNOSIS — E538 Deficiency of other specified B group vitamins: Secondary | ICD-10-CM

## 2012-04-08 MED ORDER — CYANOCOBALAMIN 1000 MCG/ML IJ SOLN
1000.0000 ug | Freq: Once | INTRAMUSCULAR | Status: AC
  Administered 2012-04-08: 1000 ug via INTRAMUSCULAR

## 2012-04-08 MED ORDER — GADOBENATE DIMEGLUMINE 529 MG/ML IV SOLN: 13.0000 mL | Freq: Once | INTRAVENOUS | Status: AC | PRN

## 2012-04-09 ENCOUNTER — Other Ambulatory Visit: Payer: Self-pay | Admitting: Family Medicine

## 2012-04-09 DIAGNOSIS — R9089 Other abnormal findings on diagnostic imaging of central nervous system: Secondary | ICD-10-CM

## 2012-04-09 DIAGNOSIS — R519 Headache, unspecified: Secondary | ICD-10-CM

## 2012-04-09 MED ORDER — CYANOCOBALAMIN 1000 MCG/ML IJ SOLN: 1000.0000 ug | INTRAMUSCULAR | Status: DC

## 2012-04-09 NOTE — Addendum Note (Signed)
Addended by: Dianne Dun on: 04/09/2012 07:33 AM   Modules accepted: Orders

## 2012-04-24 ENCOUNTER — Telehealth: Payer: Self-pay | Admitting: Family Medicine

## 2012-04-24 NOTE — Telephone Encounter (Signed)
I cannot say for sure but I do not think they are related.

## 2012-04-24 NOTE — Telephone Encounter (Signed)
Pt was calling and wondering if there was a connection going on between her abnormal finding on her MRI of the brain and she says she was diagnosed by Dr. Hetty Ely with Bell's Palsy. She wasn't sure how long ago she was diagnosed with this and she says Dr. Hetty Ely didn't treat her for this as well ??? She was just wondering if this may have anything to do with what is going on.

## 2012-04-27 NOTE — Telephone Encounter (Signed)
Advised patient.  She has appt with neurologist week after next.

## 2012-04-27 NOTE — Telephone Encounter (Signed)
Left message asking pt to call back. 

## 2012-04-29 ENCOUNTER — Other Ambulatory Visit: Payer: Self-pay | Admitting: *Deleted

## 2012-04-29 MED ORDER — URSODIOL 300 MG PO CAPS: ORAL_CAPSULE | ORAL | Status: DC

## 2012-05-05 ENCOUNTER — Other Ambulatory Visit: Payer: Self-pay | Admitting: *Deleted

## 2012-05-05 MED ORDER — VENLAFAXINE HCL 37.5 MG PO TABS: ORAL_TABLET | ORAL | Status: DC

## 2012-05-25 ENCOUNTER — Other Ambulatory Visit: Payer: Self-pay | Admitting: Neurology

## 2012-05-25 DIAGNOSIS — R9409 Abnormal results of other function studies of central nervous system: Secondary | ICD-10-CM

## 2012-05-25 DIAGNOSIS — R51 Headache: Secondary | ICD-10-CM

## 2012-06-01 ENCOUNTER — Ambulatory Visit
Admission: RE | Admit: 2012-06-01 | Discharge: 2012-06-01 | Disposition: A | Discharge status: Home / Self Care | Payer: Medicare Other | Source: Ambulatory Visit | Attending: Neurology | Admitting: Neurology

## 2012-06-01 ENCOUNTER — Ambulatory Visit
Admission: RE | Admit: 2012-06-01 | Discharge: 2012-06-01 | Disposition: A | Discharge status: Home / Self Care | Payer: BC Managed Care – PPO | Source: Ambulatory Visit | Attending: Neurology | Admitting: Neurology

## 2012-06-01 DIAGNOSIS — R51 Headache: Secondary | ICD-10-CM

## 2012-06-01 DIAGNOSIS — R9409 Abnormal results of other function studies of central nervous system: Secondary | ICD-10-CM

## 2012-06-01 MED ORDER — GADOBENATE DIMEGLUMINE 529 MG/ML IV SOLN
13.0000 mL | Freq: Once | INTRAVENOUS | Status: AC | PRN
  Administered 2012-06-01: 13 mL via INTRAVENOUS

## 2012-06-22 ENCOUNTER — Other Ambulatory Visit: Payer: Self-pay | Admitting: *Deleted

## 2012-06-23 MED ORDER — PROPRANOLOL HCL 40 MG PO TABS: ORAL_TABLET | ORAL | Status: DC

## 2012-07-04 ENCOUNTER — Other Ambulatory Visit: Payer: Self-pay | Admitting: Family Medicine

## 2012-07-21 ENCOUNTER — Telehealth: Payer: Self-pay | Admitting: *Deleted

## 2012-07-21 NOTE — Telephone Encounter (Signed)
Pt received a letter from ins that they will not fill her Nexium without having a letter from Dr stating that this med is needed to treat pt's acid reflux

## 2012-07-24 NOTE — Telephone Encounter (Signed)
Pt brought letter that is requesting PA for Nexium;letter dated 07/02/12. Pt will contact [facility] and request PA form to Dr Dayton Martes.

## 2012-07-24 NOTE — Telephone Encounter (Signed)
Pt called back needs letter sent to insurance co that Nexium is only med that successfully treats her acid reflux. Pt will bring letter to office this morning.

## 2012-08-25 ENCOUNTER — Other Ambulatory Visit: Payer: Self-pay | Admitting: Obstetrics and Gynecology

## 2012-08-25 ENCOUNTER — Other Ambulatory Visit: Payer: Self-pay | Admitting: Family Medicine

## 2012-08-25 DIAGNOSIS — Z1231 Encounter for screening mammogram for malignant neoplasm of breast: Secondary | ICD-10-CM

## 2012-08-27 ENCOUNTER — Telehealth: Payer: Self-pay | Admitting: *Deleted

## 2012-08-27 NOTE — Telephone Encounter (Signed)
Pharmacy faxed prior auth request for Nexium.

## 2012-08-27 NOTE — Telephone Encounter (Signed)
Prior auth given over the phone

## 2012-08-28 NOTE — Telephone Encounter (Signed)
Prior auth approval letter for nexium placed on doctor's desk for signature and scanning.

## 2012-09-28 ENCOUNTER — Other Ambulatory Visit: Payer: Self-pay | Admitting: Family Medicine

## 2012-09-28 NOTE — Telephone Encounter (Signed)
Received refill request electronically. Last office visit 04/03/12, last lipid 02/13/11. Note sent to pharmacy for patient last refill; needs to schedule follow-up for further refills. Is it okay to refill medications?

## 2012-09-28 NOTE — Telephone Encounter (Signed)
Ok to refill only one time only.  Needs appt for further refills.

## 2012-09-29 ENCOUNTER — Other Ambulatory Visit: Payer: Self-pay | Admitting: *Deleted

## 2012-09-29 MED ORDER — PROPRANOLOL HCL 40 MG PO TABS: ORAL_TABLET | ORAL | Status: DC

## 2012-09-30 ENCOUNTER — Ambulatory Visit
Admission: RE | Admit: 2012-09-30 | Discharge: 2012-09-30 | Disposition: A | Discharge status: Home / Self Care | Payer: Self-pay | Source: Ambulatory Visit | Attending: Obstetrics and Gynecology | Admitting: Obstetrics and Gynecology

## 2012-09-30 DIAGNOSIS — Z1231 Encounter for screening mammogram for malignant neoplasm of breast: Secondary | ICD-10-CM

## 2012-10-01 ENCOUNTER — Other Ambulatory Visit: Payer: Self-pay | Admitting: Family Medicine

## 2012-10-06 ENCOUNTER — Other Ambulatory Visit: Payer: Self-pay | Admitting: Obstetrics and Gynecology

## 2012-10-06 DIAGNOSIS — R928 Other abnormal and inconclusive findings on diagnostic imaging of breast: Secondary | ICD-10-CM

## 2012-10-14 ENCOUNTER — Other Ambulatory Visit: Payer: Self-pay | Admitting: *Deleted

## 2012-10-14 MED ORDER — VENLAFAXINE HCL 37.5 MG PO TABS: ORAL_TABLET | ORAL | Status: DC

## 2012-10-14 NOTE — Telephone Encounter (Signed)
OK to refill? Has not been seen for this in quite some time-only acute visits since 12/12.

## 2012-10-14 NOTE — Telephone Encounter (Signed)
Ok to refill once.  Needs to be seen for further refills. 

## 2012-10-22 ENCOUNTER — Ambulatory Visit
Admission: RE | Admit: 2012-10-22 | Discharge: 2012-10-22 | Disposition: A | Discharge status: Home / Self Care | Payer: 59 | Source: Ambulatory Visit | Attending: Obstetrics and Gynecology | Admitting: Obstetrics and Gynecology

## 2012-10-22 ENCOUNTER — Ambulatory Visit (INDEPENDENT_AMBULATORY_CARE_PROVIDER_SITE_OTHER): Payer: 59 | Admitting: *Deleted

## 2012-10-22 DIAGNOSIS — R928 Other abnormal and inconclusive findings on diagnostic imaging of breast: Secondary | ICD-10-CM

## 2012-10-22 DIAGNOSIS — E538 Deficiency of other specified B group vitamins: Secondary | ICD-10-CM

## 2012-10-22 MED ORDER — CYANOCOBALAMIN 1000 MCG/ML IJ SOLN
1000.0000 ug | Freq: Once | INTRAMUSCULAR | Status: AC
  Administered 2012-10-22: 1000 ug via INTRAMUSCULAR

## 2012-11-03 ENCOUNTER — Other Ambulatory Visit: Payer: Self-pay | Admitting: *Deleted

## 2012-11-03 MED ORDER — URSODIOL 300 MG PO CAPS: ORAL_CAPSULE | ORAL | Status: DC

## 2012-11-20 ENCOUNTER — Ambulatory Visit: Payer: 59

## 2012-11-24 ENCOUNTER — Other Ambulatory Visit: Payer: Self-pay | Admitting: Family Medicine

## 2012-11-25 ENCOUNTER — Other Ambulatory Visit: Payer: Self-pay | Admitting: *Deleted

## 2012-11-25 ENCOUNTER — Ambulatory Visit (INDEPENDENT_AMBULATORY_CARE_PROVIDER_SITE_OTHER): Payer: 59 | Admitting: *Deleted

## 2012-11-25 DIAGNOSIS — E538 Deficiency of other specified B group vitamins: Secondary | ICD-10-CM

## 2012-11-25 MED ORDER — CYANOCOBALAMIN 1000 MCG/ML IJ SOLN
1000.0000 ug | Freq: Once | INTRAMUSCULAR | Status: AC
  Administered 2012-11-25: 1000 ug via INTRAMUSCULAR

## 2012-11-25 MED ORDER — PROPRANOLOL HCL 40 MG PO TABS: ORAL_TABLET | ORAL | Status: DC

## 2012-12-25 ENCOUNTER — Other Ambulatory Visit: Payer: Self-pay | Admitting: *Deleted

## 2012-12-25 MED ORDER — SIMVASTATIN 80 MG PO TABS: ORAL_TABLET | ORAL | Status: DC

## 2013-01-15 ENCOUNTER — Other Ambulatory Visit: Payer: Self-pay | Admitting: Family Medicine

## 2013-01-29 ENCOUNTER — Other Ambulatory Visit: Payer: Self-pay | Admitting: *Deleted

## 2013-02-04 ENCOUNTER — Other Ambulatory Visit: Payer: Self-pay | Admitting: *Deleted

## 2013-02-05 ENCOUNTER — Telehealth: Payer: Self-pay

## 2013-02-05 ENCOUNTER — Encounter: Payer: Self-pay | Admitting: Family Medicine

## 2013-02-05 ENCOUNTER — Ambulatory Visit: Payer: 59

## 2013-02-05 ENCOUNTER — Ambulatory Visit (INDEPENDENT_AMBULATORY_CARE_PROVIDER_SITE_OTHER): Payer: 59 | Admitting: Family Medicine

## 2013-02-05 VITALS — BP 130/72 | HR 69 | Temp 98.1°F | Ht 63.5 in | Wt 145.5 lb

## 2013-02-05 DIAGNOSIS — E538 Deficiency of other specified B group vitamins: Secondary | ICD-10-CM

## 2013-02-05 DIAGNOSIS — E782 Mixed hyperlipidemia: Secondary | ICD-10-CM

## 2013-02-05 DIAGNOSIS — R42 Dizziness and giddiness: Secondary | ICD-10-CM

## 2013-02-05 DIAGNOSIS — R29898 Other symptoms and signs involving the musculoskeletal system: Secondary | ICD-10-CM

## 2013-02-05 DIAGNOSIS — D510 Vitamin B12 deficiency anemia due to intrinsic factor deficiency: Secondary | ICD-10-CM

## 2013-02-05 LAB — CBC WITH DIFFERENTIAL/PLATELET
Basophils Relative: 1 % (ref 0–1)
Eosinophils Absolute: 0.2 10*3/uL (ref 0.0–0.7)
Eosinophils Relative: 5 % (ref 0–5)
Hemoglobin: 12.9 g/dL (ref 12.0–15.0)
Lymphs Abs: 1.2 10*3/uL (ref 0.7–4.0)
MCH: 30.8 pg (ref 26.0–34.0)
MCHC: 35.1 g/dL (ref 30.0–36.0)
MCV: 87.6 fL (ref 78.0–100.0)
Monocytes Relative: 9 % (ref 3–12)
Platelets: 256 10*3/uL (ref 150–400)
RBC: 4.19 MIL/uL (ref 3.87–5.11)

## 2013-02-05 LAB — COMPREHENSIVE METABOLIC PANEL
AST: 17 U/L (ref 0–37)
Albumin: 3.7 g/dL (ref 3.5–5.2)
Alkaline Phosphatase: 75 U/L (ref 39–117)
BUN: 11 mg/dL (ref 6–23)
Creatinine, Ser: 0.9 mg/dL (ref 0.4–1.2)
Glucose, Bld: 90 mg/dL (ref 70–99)
Potassium: 5.7 mEq/L — ABNORMAL HIGH (ref 3.5–5.1)

## 2013-02-05 LAB — LIPID PANEL
Cholesterol: 275 mg/dL — ABNORMAL HIGH (ref 0–200)
HDL: 49.5 mg/dL (ref >39.00)
Total CHOL/HDL Ratio: 6
Triglycerides: 276 mg/dL — ABNORMAL HIGH (ref 0.0–149.0)

## 2013-02-05 LAB — VITAMIN B12: Vitamin B-12: 191 pg/mL — ABNORMAL LOW (ref 211–911)

## 2013-02-05 MED ORDER — SIMVASTATIN 80 MG PO TABS: ORAL_TABLET | ORAL | Status: DC

## 2013-02-05 NOTE — Telephone Encounter (Signed)
Pt said has dizziness and h/a on and off for awhile but on 02/04/13 pt said more severe dizziness and h/a; on 02/04/13 for 10-15 mins had difficulty raising rt foot. Pt said no problem with foot today and slight dizziness and h/a today. No CP or SOB. Pt does not want to wait until next week to be seen. Dr Dayton Martes not available; pt has appt with Dr Ermalene Searing today at 10:45 am.

## 2013-02-05 NOTE — Patient Instructions (Addendum)
Schedule a medicare wellness visit in next few weeks with Dr. Dayton Martes...stop at lab on way out. Start daily baby aspirin. Stop at front desk to set up carotid dopplers. Let us know if you have any further symptoms.

## 2013-02-05 NOTE — Progress Notes (Signed)
Subjective:    Patient ID: Katie [name], female    DOB: 1945-12-12, 67 y.o.   MRN: [id number]  HPI  67 year old female pt of Dr. Elmer Sow presents with  an episode of  Usual headache (not severe) mid-day yesterday.  2-3 hours later... She was shopping yesterday... Felt right leg was heavy all of a sudden.  no numbness.  She kept moving and walking and heaviness went away after 10 minutes.  No other neuro changes.. No slurred speech, no confusion, no new vision change ( occ had been having some difficulty focusing for months).  Has been having dizzy spells off and on for a while associated with headahce. None yesterday.  She feels like she may have a lite tremor of head.. Bothers her. She had one aunt with benign tremor.  Last fall she had an MRI to evaluate headaches.. Was nml, diagnosed with custer headaches.    Some recent increase in stress   Review of Systems  Constitutional: Negative for fever and fatigue.  HENT: Negative for ear pain.   Eyes: Negative for pain.  Respiratory: Negative for chest tightness and shortness of breath.   Cardiovascular: Negative for chest pain, palpitations and leg swelling.  Gastrointestinal: Negative for abdominal pain.  Genitourinary: Negative for dysuria.       Objective:   Physical Exam  Constitutional: She is oriented to person, place, and time. Vital signs are normal. She appears well-developed and well-nourished. She is cooperative.  Non-toxic appearance. She does not appear ill. No distress.  HENT:  Head: Normocephalic.  Right Ear: Hearing, tympanic membrane, external ear and ear canal normal. Tympanic membrane is not erythematous, not retracted and not bulging.  Left Ear: Hearing, tympanic membrane, external ear and ear canal normal. Tympanic membrane is not erythematous, not retracted and not bulging.  Nose: No mucosal edema or rhinorrhea. Right sinus exhibits no maxillary sinus tenderness and no frontal sinus tenderness. Left sinus  exhibits no maxillary sinus tenderness and no frontal sinus tenderness.  Mouth/Throat: Uvula is midline, oropharynx is clear and moist and mucous membranes are normal.  Eyes: Conjunctivae, EOM and lids are normal. Pupils are equal, round, and reactive to light. No foreign bodies found.  Neck: Trachea normal and normal range of motion. Neck supple. Carotid bruit is not present. No mass and no thyromegaly present.  Cardiovascular: Normal rate, regular rhythm, S1 normal, S2 normal, normal heart sounds, intact distal pulses and normal pulses.  Exam reveals no gallop and no friction rub.   No murmur heard. Pulmonary/Chest: Effort normal and breath sounds normal. Not tachypneic. No respiratory distress. She has no decreased breath sounds. She has no wheezes. She has no rhonchi. She has no rales.  Abdominal: Soft. Normal appearance and bowel sounds are normal. There is no tenderness.  Neurological: She is alert and oriented to person, place, and time. She has normal strength and normal reflexes. No cranial nerve deficit or sensory deficit. She displays a negative Romberg sign. Gait normal.  Skin: Skin is warm, dry and intact. No rash noted.  Psychiatric: Her speech is normal and behavior is normal. Judgment and thought content normal. Her mood appears not anxious. Cognition and memory are normal. She does not exhibit a depressed mood.          Assessment & Plan  Symptoms concerning for possible TIA in pt with history of high cholesterol (no recent check).  No family histroy of CVA. MRI brain nml in 05/2012... No neuro changes on exam today.  Doubt any utility in repeat MRI given likely no changes associated with TIA even if it occurred. Pt agreeable.  Will have pt start baby aspirin.  Given dizziness ... Will eval with carotid dopplers.  Will hold on any ECHO.  Eval for risk factors.. And pt request yearly labs to be checked.  She will set up medicare wellness with PCP.

## 2013-02-09 ENCOUNTER — Other Ambulatory Visit (INDEPENDENT_AMBULATORY_CARE_PROVIDER_SITE_OTHER): Payer: 59

## 2013-02-09 DIAGNOSIS — E875 Hyperkalemia: Secondary | ICD-10-CM

## 2013-02-09 MED ORDER — SIMVASTATIN 80 MG PO TABS: ORAL_TABLET | ORAL | Status: DC

## 2013-02-09 NOTE — Addendum Note (Signed)
Addended by: Alvina Chou on: 02/09/2013 02:56 PM   Modules accepted: Orders

## 2013-02-12 NOTE — Addendum Note (Signed)
Addended by: Kerby Nora E on: 02/12/2013 03:52 PM   Modules accepted: Orders

## 2013-02-24 NOTE — Telephone Encounter (Signed)
Pt checking on status of simvastatin refill; [facility] has available refills and will get rx ready for pick up . Pt notified.

## 2013-03-17 ENCOUNTER — Encounter (INDEPENDENT_AMBULATORY_CARE_PROVIDER_SITE_OTHER): Payer: Medicare Other

## 2013-03-17 ENCOUNTER — Other Ambulatory Visit: Payer: Self-pay | Admitting: Family Medicine

## 2013-03-17 DIAGNOSIS — G459 Transient cerebral ischemic attack, unspecified: Secondary | ICD-10-CM

## 2013-03-17 DIAGNOSIS — E538 Deficiency of other specified B group vitamins: Secondary | ICD-10-CM

## 2013-03-17 DIAGNOSIS — R42 Dizziness and giddiness: Secondary | ICD-10-CM

## 2013-03-17 DIAGNOSIS — E782 Mixed hyperlipidemia: Secondary | ICD-10-CM

## 2013-03-18 ENCOUNTER — Encounter: Payer: Self-pay | Admitting: *Deleted

## 2013-03-25 ENCOUNTER — Other Ambulatory Visit (INDEPENDENT_AMBULATORY_CARE_PROVIDER_SITE_OTHER): Payer: Medicare Other

## 2013-03-25 DIAGNOSIS — E782 Mixed hyperlipidemia: Secondary | ICD-10-CM

## 2013-03-25 DIAGNOSIS — E538 Deficiency of other specified B group vitamins: Secondary | ICD-10-CM

## 2013-03-25 LAB — COMPREHENSIVE METABOLIC PANEL
Albumin: 3.9 g/dL (ref 3.5–5.2)
BUN: 14 mg/dL (ref 6–23)
CO2: 30 mEq/L (ref 19–32)
Calcium: 9.1 mg/dL (ref 8.4–10.5)
Chloride: 104 mEq/L (ref 96–112)
GFR: 65.78 mL/min (ref >60.00)
Glucose, Bld: 106 mg/dL — ABNORMAL HIGH (ref 70–99)
Potassium: 5 mEq/L (ref 3.5–5.1)
Sodium: 135 mEq/L (ref 135–145)
Total Protein: 7.3 g/dL (ref 6.0–8.3)

## 2013-03-25 LAB — LIPID PANEL
Cholesterol: 221 mg/dL — ABNORMAL HIGH (ref 0–200)
Total CHOL/HDL Ratio: 5

## 2013-04-01 ENCOUNTER — Encounter: Payer: 59 | Admitting: Family Medicine

## 2013-04-09 ENCOUNTER — Other Ambulatory Visit: Payer: Self-pay | Admitting: *Deleted

## 2013-04-09 MED ORDER — VENLAFAXINE HCL 37.5 MG PO TABS: ORAL_TABLET | ORAL | Status: DC

## 2013-04-21 ENCOUNTER — Other Ambulatory Visit: Payer: Self-pay

## 2013-04-26 DIAGNOSIS — I10 Essential (primary) hypertension: Secondary | ICD-10-CM | POA: Insufficient documentation

## 2013-05-04 HISTORY — PX: GASTRIC BYPASS: SHX52

## 2013-05-10 ENCOUNTER — Other Ambulatory Visit: Payer: Self-pay | Admitting: *Deleted

## 2013-05-10 MED ORDER — URSODIOL 300 MG PO CAPS: ORAL_CAPSULE | ORAL | Status: DC

## 2013-05-25 ENCOUNTER — Telehealth: Payer: Self-pay | Admitting: Neurology

## 2013-05-25 DIAGNOSIS — R9089 Other abnormal findings on diagnostic imaging of central nervous system: Secondary | ICD-10-CM

## 2013-05-25 NOTE — Telephone Encounter (Signed)
I called patient. The patient had an abnormal MRI the brain one year ago. We will repeat the study and compared to the prior evaluation.

## 2013-06-04 ENCOUNTER — Other Ambulatory Visit: Payer: Self-pay | Admitting: *Deleted

## 2013-06-04 MED ORDER — ESOMEPRAZOLE MAGNESIUM 40 MG PO CPDR: DELAYED_RELEASE_CAPSULE | ORAL | Status: DC

## 2013-06-04 MED ORDER — PROPRANOLOL HCL 40 MG PO TABS: ORAL_TABLET | ORAL | Status: DC

## 2013-06-17 ENCOUNTER — Encounter: Payer: Self-pay | Admitting: Family Medicine

## 2013-06-17 ENCOUNTER — Ambulatory Visit (INDEPENDENT_AMBULATORY_CARE_PROVIDER_SITE_OTHER): Payer: Medicare Other | Admitting: Family Medicine

## 2013-06-17 VITALS — BP 124/72 | HR 54 | Temp 97.9°F | Ht 64.0 in | Wt 126.5 lb

## 2013-06-17 DIAGNOSIS — R739 Hyperglycemia, unspecified: Secondary | ICD-10-CM

## 2013-06-17 DIAGNOSIS — E782 Mixed hyperlipidemia: Secondary | ICD-10-CM

## 2013-06-17 DIAGNOSIS — E538 Deficiency of other specified B group vitamins: Secondary | ICD-10-CM

## 2013-06-17 DIAGNOSIS — Z0000 Encounter for general adult medical examination without abnormal findings: Secondary | ICD-10-CM

## 2013-06-17 DIAGNOSIS — R7309 Other abnormal glucose: Secondary | ICD-10-CM

## 2013-06-17 LAB — LIPID PANEL
HDL: 53.2 mg/dL (ref >39.00)
LDL Cholesterol: 107 mg/dL — ABNORMAL HIGH (ref 0–99)
Total CHOL/HDL Ratio: 4
Triglycerides: 175 mg/dL — ABNORMAL HIGH (ref 0.0–149.0)
VLDL: 35 mg/dL (ref 0.0–40.0)

## 2013-06-17 LAB — BASIC METABOLIC PANEL
CO2: 27 mEq/L (ref 19–32)
Calcium: 9.1 mg/dL (ref 8.4–10.5)
Creatinine, Ser: 0.8 mg/dL (ref 0.4–1.2)
GFR: 77.38 mL/min (ref >60.00)

## 2013-06-17 MED ORDER — VENLAFAXINE HCL 37.5 MG PO TABS: ORAL_TABLET | ORAL | Status: DC

## 2013-06-17 MED ORDER — SIMVASTATIN 80 MG PO TABS: ORAL_TABLET | ORAL | Status: DC

## 2013-06-17 MED ORDER — ESOMEPRAZOLE MAGNESIUM 40 MG PO CPDR: DELAYED_RELEASE_CAPSULE | ORAL | Status: DC

## 2013-06-17 MED ORDER — PROPRANOLOL HCL 40 MG PO TABS: ORAL_TABLET | ORAL | Status: DC

## 2013-06-17 MED ORDER — CYANOCOBALAMIN 1000 MCG/ML IJ SOLN
1000.0000 ug | Freq: Once | INTRAMUSCULAR | Status: AC
  Administered 2013-06-17: 1000 ug via INTRAMUSCULAR

## 2013-06-17 NOTE — Patient Instructions (Addendum)
Good to see you. We will call you with your lab results.  You look fantastic!

## 2013-06-17 NOTE — Progress Notes (Signed)
Subjective:    Patient ID: Katie [name], female    DOB: 1946-06-13, 66 y.o.   MRN: [id number]  HPI  67 yo pleasant female here for initial medicare wellness visit.  I have personally reviewed the Medicare Annual Wellness questionnaire and have noted 1. The patient's medical and social history 2. Their use of alcohol, tobacco or illicit drugs 3. Their current medications and supplements 4. The patient's functional ability including ADL's, fall risks, home safety risks and hearing or visual             impairment. 5. Diet and physical activities 6. Evidence for depression or mood disorders  End of life wishes discussed and updated in Social History.  Colonoscopy UTD- 2012 and 2014.  Mammogram UTD in 09/2012.   Persistent biliary issues- s/p multiple ERCPs, biliary bypass.  Had gastric bypass in 04/2013 due to this.  Feels much better.  Has lost weight but feels this was more due to liquid diet.  Size of stomach about the same (awaiting records).  Wt Readings from Last 3 Encounters:  06/17/13 126 lb 8 oz (57.38 kg)  02/05/13 145 lb 8 oz (65.998 kg)  04/03/12 142 lb (64.411 kg)    B12 deficiency- previous Dr. Hetty Ely pt.  Has been receiving B12 injections for years. Lab Results  Component Value Date   VITAMINB12 397 03/25/2013   Does not feel more fatigued than usual.  HLD-On Zocor 80 mg daily. Lab Results  Component Value Date   CHOL 221* 03/25/2013   HDL 47.60 03/25/2013   LDLCALC 97 02/05/2008   LDLDIRECT 115.1 03/25/2013   TRIG 416.0 Triglyceride is over 400; calculations on Lipids are invalid.* 03/25/2013   CHOLHDL 5 03/25/2013   Lab Results  Component Value Date   ALT 19 03/25/2013   AST 16 03/25/2013   ALKPHOS 102 03/25/2013   BILITOT 0.8 03/25/2013     Patient Active Problem List   Diagnosis Date Noted  . Medicare annual wellness visit, subsequent 06/17/2013  . Dizziness and giddiness 02/05/2013  . B12 deficiency 04/03/2012  . ABDOMINAL PAIN RIGHT UPPER QUADRANT  04/13/2010  . GERD 05/11/2008  . CHOLEDOCHOLITHIASIS 04/26/2008  . BILE DUCT STRICTURE 04/26/2008  . HYPERLIPIDEMIA, MIXED 03/13/2007  . ANEMIA, PERNICIOUS 03/13/2007  . Depression 02/13/1995   Past Medical History  Diagnosis Date  . Choledocholithiasis     recurrent  . Bile duct stricture    Past Surgical History  Procedure Laterality Date  . Foot surgery      both feet  . Vein ligation and stripping      left leg  . Partial hysterectomy    . Bladder surgery      x 2  . Tubal ligation    . Wrist surgery      right  . Breast lumpectomy      right  . Cholecystectomy    . Explor lap, hepatoduodenostomy  07/23/11    Dr Madaline Brilliant, [facility]   History  Substance Use Topics  . Smoking status: Never Smoker   . Smokeless tobacco: Never Used  . Alcohol Use: No   Family History  Problem Relation Age of Onset  . Cancer Mother     lung, smoker  . Alcohol abuse Father   . GER disease Sister   . GER disease Brother   . Depression Sister   . Anxiety disorder Sister   . Hypertension Sister    No Known Allergies Current Outpatient Prescriptions on File Prior to Visit  Medication Sig Dispense Refill  . cyanocobalamin (,VITAMIN B-12,) 1000 MCG/ML injection Inject 1 mL (1,000 mcg total) into the muscle every 30 (thirty) days.  1 mL  6  . esomeprazole (NEXIUM) 40 MG capsule TAKE 1 CAPSULE BY MOUTH TWICE DAILY  60 capsule  0  . propranolol (INDERAL) 40 MG tablet Take one tablet by mouth twice a day.  60 tablet  0  . simvastatin (ZOCOR) 80 MG tablet TAKE 1 TABLET BY MOUTH EVERY NIGHT AT BEDTIME  30 tablet  3  . ursodiol (ACTIGALL) 300 MG capsule Take one capsule by mouth twice a day  60 capsule  5  . venlafaxine (EFFEXOR) 37.5 MG tablet TAKE 2 TABLETS BY MOUTH EVERY MORNING AND TAKE 1 TABLET EVERY EVENING  90 tablet  3   No current facility-administered medications on file prior to visit.   The PMH, PSH, Social History, Family History, Medications, and allergies have been reviewed in [facility],  and have been updated if relevant.    Review of Systems See HPI No night time awakenings from HA No vomiting No falls    Objective:   Physical Exam BP 124/72  Pulse 54  Temp(Src) 97.9 F (36.6 C) (Oral)  Ht 5\' 4"  (1.626 m)  Wt 126 lb 8 oz (57.38 kg)  BMI 21.7 kg/m2  SpO2 98%  General:  Well-developed,well-nourished,in no acute distress; alert,appropriate and cooperative throughout examination Head:  normocephalic and atraumatic.   Eyes:  vision grossly intact, pupils equal, pupils round, and pupils reactive to light.   Ears:  R ear normal and L ear normal.   Nose:  no external deformity.   Mouth:  good dentition.   Lungs:  Normal respiratory effort, chest expands symmetrically. Lungs are clear to auscultation, no crackles or wheezes. Heart:  Normal rate and regular rhythm. S1 and S2 normal without gallop, murmur, click, rub or other extra sounds. Abdomen:  Bowel sounds positive,abdomen soft and non-tender without masses, organomegaly or hernias noted. Msk:  No deformity or scoliosis noted of thoracic or lumbar spine.   Extremities:  No clubbing, cyanosis, edema, or deformity noted with normal full range of motion of all joints.   Neurologic:  alert & oriented X3 and gait normal. Normal strength in all four extremities   Skin:  Intact without suspicious lesions or rashes Psych:  Cognition and judgment appear intact. Alert and cooperative with normal attention span and concentration. No apparent delusions, illusions, hallucinations    Assessment & Plan:     1. Medicare annual wellness visit, initial The patients weight, height, BMI and visual acuity have been recorded in the chart I have made referrals, counseling and provided education to the patient based review of the above and I have provided the pt with a written personalized care plan for preventive services.  2. HYPERLIPIDEMIA, MIXED Likely improved now with weight loss. Will recheck lipid panel today. - Lipid  Panel  3. Hyperglycemia Likely improved.  Recheck BMET. - Basic Metabolic Panel

## 2013-06-17 NOTE — Addendum Note (Signed)
Addended by: Shon Millet on: 06/17/2013 09:08 AM   Modules accepted: Orders

## 2013-07-07 ENCOUNTER — Encounter: Payer: Self-pay | Admitting: Family Medicine

## 2013-07-07 ENCOUNTER — Ambulatory Visit (INDEPENDENT_AMBULATORY_CARE_PROVIDER_SITE_OTHER): Payer: Medicare Other | Admitting: Family Medicine

## 2013-07-07 VITALS — BP 138/76 | HR 74 | Temp 99.0°F | Wt 130.8 lb

## 2013-07-07 DIAGNOSIS — R059 Cough, unspecified: Secondary | ICD-10-CM

## 2013-07-07 DIAGNOSIS — R05 Cough: Secondary | ICD-10-CM | POA: Insufficient documentation

## 2013-07-07 MED ORDER — AZITHROMYCIN 500 MG PO TABS: 500.0000 mg | ORAL_TABLET | Freq: Every day | ORAL | Status: DC

## 2013-07-07 NOTE — Patient Instructions (Signed)
Take the antibiotics and use robitussin for the cough.  This should gradually improve.  Take care.  Glad to see you.

## 2013-07-07 NOTE — Progress Notes (Signed)
Gastric bypass ~2 months.  She is back to a more normal diet, with some portion restriction.  She didn't have sig stomach size reduction.    Since the weekend she had more chest congestion. Possible low grade fever, none >100.4.  Chest feels slightly tighter.  ST.  Voice is altered. Pain with swallowing.  Coughing, some sputum, greenish. Discolored rhinorrhea.  Ears feel full.  No vomiting, no blood in stool.  No abd pain.    Meds, vitals, and allergies reviewed.   ROS: See HPI.  Otherwise, noncontributory.  GEN: nad, alert and oriented HEENT: mucous membranes moist, tm w/o erythema, nasal exam w/o erythema, clear discharge noted,  OP with cobblestoning, max sinuses ttp B NECK: supple w/o LA CV: rrr.   PULM: ctab except for scant exp wheeze in the B lower lung fields, no inc wob EXT: no edema SKIN: no acute rash

## 2013-07-07 NOTE — Assessment & Plan Note (Signed)
Would treat given the lung finding and max tenderness.  Nontoxic.  Gastric bypass lowers the absorption of zmax, so dose was increased.  D/w pt.  She agrees. OTC cough meds, supportive care o/w. F/u prn.

## 2013-08-02 ENCOUNTER — Other Ambulatory Visit: Payer: Self-pay | Admitting: Family Medicine

## 2013-08-09 ENCOUNTER — Ambulatory Visit
Admission: RE | Admit: 2013-08-09 | Discharge: 2013-08-09 | Disposition: A | Discharge status: Home / Self Care | Payer: Medicare Other | Source: Ambulatory Visit | Attending: Neurology | Admitting: Neurology

## 2013-08-09 DIAGNOSIS — R51 Headache: Secondary | ICD-10-CM

## 2013-08-09 DIAGNOSIS — R9089 Other abnormal findings on diagnostic imaging of central nervous system: Secondary | ICD-10-CM

## 2013-08-11 ENCOUNTER — Telehealth: Payer: Self-pay | Admitting: Neurology

## 2013-08-11 NOTE — Telephone Encounter (Signed)
I called patient. MRI the brain was done, again showing white matter changes that appeared to include the brainstem and hemispheric white matter. Bilateral view, there may be minimal change, possibly a new punctate right frontal white matter spot, but other areas such as the left parietal white matter changes appear to be improved from the study done in July 2013. The patient is on low-dose aspirin. Blood pressures have been well controlled. We will continue to follow. The patient has new symptoms of numbness, gait disturbance, visual problems, etc. may consider lumbar puncture. I'll recheck a scan in about 2 years.

## 2013-10-13 ENCOUNTER — Other Ambulatory Visit: Payer: Self-pay

## 2013-10-13 DIAGNOSIS — Z1231 Encounter for screening mammogram for malignant neoplasm of breast: Secondary | ICD-10-CM

## 2013-10-14 ENCOUNTER — Ambulatory Visit
Admission: RE | Admit: 2013-10-14 | Discharge: 2013-10-14 | Disposition: A | Discharge status: Home / Self Care | Payer: Medicare Other | Source: Ambulatory Visit

## 2013-10-14 DIAGNOSIS — Z1231 Encounter for screening mammogram for malignant neoplasm of breast: Secondary | ICD-10-CM

## 2013-10-23 ENCOUNTER — Other Ambulatory Visit: Payer: Self-pay | Admitting: Family Medicine

## 2013-11-06 ENCOUNTER — Other Ambulatory Visit: Payer: Self-pay | Admitting: Family Medicine

## 2013-11-08 ENCOUNTER — Other Ambulatory Visit: Payer: Self-pay | Admitting: Family Medicine

## 2013-11-08 ENCOUNTER — Ambulatory Visit (INDEPENDENT_AMBULATORY_CARE_PROVIDER_SITE_OTHER): Payer: Medicare Other | Admitting: Family Medicine

## 2013-11-08 ENCOUNTER — Encounter: Payer: Self-pay | Admitting: Family Medicine

## 2013-11-08 VITALS — BP 122/72 | HR 60 | Temp 97.5°F | Ht 63.0 in | Wt 130.2 lb

## 2013-11-08 DIAGNOSIS — G250 Essential tremor: Secondary | ICD-10-CM | POA: Insufficient documentation

## 2013-11-08 DIAGNOSIS — E538 Deficiency of other specified B group vitamins: Secondary | ICD-10-CM

## 2013-11-08 DIAGNOSIS — H539 Unspecified visual disturbance: Secondary | ICD-10-CM

## 2013-11-08 DIAGNOSIS — G252 Other specified forms of tremor: Secondary | ICD-10-CM

## 2013-11-08 HISTORY — DX: Essential tremor: G25.0

## 2013-11-08 MED ORDER — ESOMEPRAZOLE MAGNESIUM 40 MG PO CPDR: DELAYED_RELEASE_CAPSULE | ORAL | Status: DC

## 2013-11-08 MED ORDER — CYANOCOBALAMIN 1000 MCG/ML IJ SOLN
1000.0000 ug | Freq: Once | INTRAMUSCULAR | Status: AC
  Administered 2013-11-08: 1000 ug via INTRAMUSCULAR

## 2013-11-08 MED ORDER — PROPRANOLOL HCL 40 MG PO TABS: ORAL_TABLET | ORAL | Status: DC

## 2013-11-08 NOTE — Assessment & Plan Note (Signed)
Advised to call her eye doctor today. The patient indicates understanding of these issues and agrees with the plan.

## 2013-11-08 NOTE — Progress Notes (Signed)
Subjective:    Patient ID: Katie [name], female    DOB: 05/06/1946, 68 y.o.   MRN: [id number]  HPI  68 yo pleasant female here for follow up.    Persistent biliary issues- s/p multiple ERCPs, biliary bypass.  Had gastric bypass in 04/2013 due to this.  Feels much better.  Has lost weight but feels this was more due to liquid diet.  Size of stomach about the same (awaiting records).  Wt Readings from Last 3 Encounters:  11/08/13 130 lb 4 oz (59.081 kg)  07/07/13 130 lb 12 oz (59.308 kg)  06/17/13 126 lb 8 oz (57.38 kg)    B12 deficiency- previous Dr. Council Mechanic pt.  Has been receiving B12 injections for years. Lab Results  Component Value Date   VHQIONGE95 284 03/25/2013     Has noticed head tremor for past several months.  Sees Dr. Jannifer Franklin- last MRI stable in 07/2013.    STUDY DATE: 08/09/13  PATIENT NAME: Katie [name]  DOB: 1946-03-29  MRN: [id number]  ORDERING CLINICIAN: Dr Jannifer Franklin  CLINICAL HISTORY: 68 year patient with headaches  COMPARISON FILMS: MRI brain 04/08/66  EXAM: MRI Brain wo  TECHNIQUE: MRI of the brain without contrast was obtained utilizing 5 mm axial slices with T1, T2, T2 flair, T2 star gradient echo and diffusion weighted views. T1 sagittal and T2 coronal views were obtained.  CONTRAST: none  IMAGING SITE: [facility]  FINDINGS:  The brain parenchyma shows multiple periventricular, subcortical and brainstem nonspecific white matter hyperintensities which amy be seen in small vessel disease, vasculitis, CADASIL, demyelinating, inflammatory, Infectious or autoimmune etiologies. No other structural lesions, tumors or infarcts are noted.No abnormal lesions are seen on diffusion-weighted views to suggest acute ischemia. The cortical sulci, fissures and cisterns are normal in size and appearance. Lateral, third and fourth ventricle are normal in size and appearance. No extra-axial fluid collections are seen. No evidence of mass effect or midline shift. On  sagittal views the posterior fossa, pituitary gland and corpus callosum are unremarkable. No evidence of intracranial hemorrhage on gradient-echo views. The orbits and their contents, paranasal sinuses and calvarium are unremarkable. Intracranial flow voids are present.  IMPRESSION: Abnormal MRI brain showing bilateral subcortical, periventricular and brainstem white matter hyperintensities with the differential discussed above. Overall minimal increase compared with prior MRI  04/08/12    Patient Active Problem List   Diagnosis Date Noted  . Cough 07/07/2013  . Medicare annual wellness visit, initial 06/17/2013  . Dizziness and giddiness 02/05/2013  . B12 deficiency 04/03/2012  . ABDOMINAL PAIN RIGHT UPPER QUADRANT 04/13/2010  . GERD 05/11/2008  . CHOLEDOCHOLITHIASIS 04/26/2008  . BILE DUCT STRICTURE 04/26/2008  . HYPERLIPIDEMIA, MIXED 03/13/2007  . ANEMIA, PERNICIOUS 03/13/2007  . Depression 02/13/1995   Past Medical History  Diagnosis Date  . Choledocholithiasis     recurrent  . Bile duct stricture    Past Surgical History  Procedure Laterality Date  . Foot surgery      both feet  . Vein ligation and stripping      left leg  . Partial hysterectomy    . Bladder surgery      x 2  . Tubal ligation    . Wrist surgery      right  . Breast lumpectomy      right  . Cholecystectomy    . Explor lap, hepatoduodenostomy  07/23/11    Dr Lazarus Gowda, [facility]  . Gastric bypass  05/04/2013   History  Substance Use Topics  .  Smoking status: Never Smoker   . Smokeless tobacco: Never Used  . Alcohol Use: No   Family History  Problem Relation Age of Onset  . Cancer Mother     lung, smoker  . Alcohol abuse Father   . GER disease Sister   . GER disease Brother   . Depression Sister   . Anxiety disorder Sister   . Hypertension Sister    No Known Allergies Current Outpatient Prescriptions on File Prior to Visit  Medication Sig Dispense Refill  . aspirin 81 MG tablet Take 81 mg by  mouth daily.      . cyanocobalamin (,VITAMIN B-12,) 1000 MCG/ML injection Inject 1 mL (1,000 mcg total) into the muscle every 30 (thirty) days.  1 mL  6  . simvastatin (ZOCOR) 80 MG tablet TAKE 1 TABLET BY MOUTH EVERY NIGHT AT BEDTIME  30 tablet  5  . ursodiol (ACTIGALL) 300 MG capsule Take one capsule by mouth twice a day  60 capsule  5  . venlafaxine (EFFEXOR) 37.5 MG tablet TAKE 2 TABLETS BY MOUTH EVERY MORNING AND 1 TABLET EVERY EVENING  90 tablet  0   No current facility-administered medications on file prior to visit.   The PMH, PSH, Social History, Family History, Medications, and allergies have been reviewed in [facility], and have been updated if relevant.    Review of Systems See HPI + visual changes No vomiting No falls    Objective:   Physical Exam BP 122/72  Pulse 60  Temp(Src) 97.5 F (36.4 C) (Oral)  Ht 5\' 3"  (1.6 m)  Wt 130 lb 4 oz (59.081 kg)  BMI 23.08 kg/m2  SpO2 99%  General:  Well-developed,well-nourished,in no acute distress; alert,appropriate and cooperative throughout examination Head:  normocephalic and atraumatic.   Eyes:  vision grossly intact, pupils equal, pupils round, and pupils reactive to light.   Ears:  R ear normal and L ear normal.   Nose:  no external deformity.   Mouth:  good dentition.   Lungs:  Normal respiratory effort, chest expands symmetrically. Lungs are clear to auscultation, no crackles or wheezes. Heart:  Normal rate and regular rhythm. S1 and S2 normal without gallop, murmur, click, rub or other extra sounds. Abdomen:  Bowel sounds positive,abdomen soft and non-tender without masses, organomegaly or hernias noted. Msk:  No deformity or scoliosis noted of thoracic or lumbar spine.   Extremities:  No clubbing, cyanosis, edema, or deformity noted with normal full range of motion of all joints.   Neurologic:  alert & oriented X3 and gait normal. Normal strength in all four extremities   Skin:  Intact without suspicious lesions or  rashes Psych:  Cognition and judgment appear intact. Alert and cooperative with normal attention span and concentration. No apparent delusions, illusions, hallucinations    Assessment & Plan:

## 2013-11-08 NOTE — Assessment & Plan Note (Signed)
B12 injection given today. Check B12.

## 2013-11-08 NOTE — Assessment & Plan Note (Signed)
Likely benign. Check labs today to rule out other possible contributing factors. The patient indicates understanding of these issues and agrees with the plan.

## 2013-11-08 NOTE — Progress Notes (Signed)
Pre visit review using our clinic review tool, if applicable. No additional management support is needed unless otherwise documented below in the visit note. 

## 2013-11-08 NOTE — Patient Instructions (Signed)
Good to see you. I will call you with your lab results.  Please call your eye doctor today.

## 2013-11-09 LAB — T4, FREE: FREE T4: 0.72 ng/dL (ref 0.60–1.60)

## 2013-11-09 LAB — COMPREHENSIVE METABOLIC PANEL
ALBUMIN: 3.7 g/dL (ref 3.5–5.2)
ALT: 18 U/L (ref 0–35)
AST: 17 U/L (ref 0–37)
Alkaline Phosphatase: 76 U/L (ref 39–117)
BILIRUBIN TOTAL: 0.7 mg/dL (ref 0.3–1.2)
BUN: 14 mg/dL (ref 6–23)
CHLORIDE: 109 meq/L (ref 96–112)
CO2: 24 meq/L (ref 19–32)
Calcium: 8.8 mg/dL (ref 8.4–10.5)
Creatinine, Ser: 0.6 mg/dL (ref 0.4–1.2)
GFR: 100.36 mL/min (ref >60.00)
GLUCOSE: 76 mg/dL (ref 70–99)
POTASSIUM: 4.5 meq/L (ref 3.5–5.1)
Sodium: 138 mEq/L (ref 135–145)
TOTAL PROTEIN: 6.3 g/dL (ref 6.0–8.3)

## 2013-11-09 LAB — VITAMIN B12

## 2013-11-09 LAB — TSH: TSH: 3.47 u[IU]/mL (ref 0.35–5.50)

## 2013-11-10 ENCOUNTER — Encounter: Payer: Self-pay | Admitting: Family Medicine

## 2013-11-16 ENCOUNTER — Telehealth: Payer: Self-pay

## 2013-11-16 NOTE — Telephone Encounter (Signed)
Pt had problems getting on mychart; Patient notified as instructed by telephone from result notes.

## 2013-11-20 ENCOUNTER — Other Ambulatory Visit: Payer: Self-pay | Admitting: Family Medicine

## 2013-11-23 ENCOUNTER — Other Ambulatory Visit: Payer: Self-pay | Admitting: Family Medicine

## 2013-11-24 NOTE — Telephone Encounter (Signed)
Pt requesting medication refill. Last ov 10/2013 with no future appts scheduled. pls advise 

## 2013-12-23 ENCOUNTER — Other Ambulatory Visit: Payer: Self-pay | Admitting: Family Medicine

## 2013-12-23 NOTE — Telephone Encounter (Signed)
Pt requesting medication refill. Last ov 10/2013 with no future appts scheduled. pls advise 

## 2013-12-30 ENCOUNTER — Ambulatory Visit: Payer: Medicare Other

## 2014-01-03 ENCOUNTER — Ambulatory Visit (INDEPENDENT_AMBULATORY_CARE_PROVIDER_SITE_OTHER): Payer: Medicare Other | Admitting: Neurology

## 2014-01-03 ENCOUNTER — Encounter: Payer: Self-pay | Admitting: Neurology

## 2014-01-03 VITALS — BP 122/65 | HR 56 | Ht 63.0 in | Wt 126.0 lb

## 2014-01-03 DIAGNOSIS — G43009 Migraine without aura, not intractable, without status migrainosus: Secondary | ICD-10-CM | POA: Insufficient documentation

## 2014-01-03 DIAGNOSIS — G252 Other specified forms of tremor: Secondary | ICD-10-CM

## 2014-01-03 DIAGNOSIS — G250 Essential tremor: Secondary | ICD-10-CM

## 2014-01-03 HISTORY — DX: Migraine without aura, not intractable, without status migrainosus: G43.009

## 2014-01-03 NOTE — Patient Instructions (Signed)
Tremor  Tremor is a rhythmic, involuntary muscular contraction characterized by oscillations (to-and-fro movements) of a part of the body. The most common of all involuntary movements, tremor can affect various body parts such as the hands, head, facial structures, vocal cords, trunk, and legs; most tremors, however, occur in the hands. Tremor often accompanies neurological disorders associated with aging. Although the disorder is not life-threatening, it can be responsible for functional disability and social embarrassment.  TREATMENT   There are many types of tremor and several ways in which tremor is classified. The most common classification is by behavioral context or position. There are five categories of tremor within this classification: resting, postural, kinetic, task-specific, and psychogenic. Resting or static tremor occurs when the muscle is at rest, for example when the hands are lying on the lap. This type of tremor is often seen in patients with Parkinson's disease. Postural tremor occurs when a patient attempts to maintain posture, such as holding the hands outstretched. Postural tremors include physiological tremor, essential tremor, tremor with basal ganglia disease (also seen in patients with Parkinson's disease), cerebellar postural tremor, tremor with peripheral neuropathy, post-traumatic tremor, and alcoholic tremor. Kinetic or intention (action) tremor occurs during purposeful movement, for example during finger-to-nose testing. Task-specific tremor appears when performing goal-oriented tasks such as handwriting, speaking, or standing. This group consists of primary writing tremor, vocal tremor, and orthostatic tremor. Psychogenic tremor occurs in both older and younger patients. The key feature of this tremor is that it dramatically lessens or disappears when the patient is distracted.  PROGNOSIS  There are some treatment options available for tremor; the appropriate treatment depends on  accurate diagnosis of the cause. Some tremors respond to treatment of the underlying condition, for example in some cases of psychogenic tremor treating the patient's underlying mental problem may cause the tremor to disappear. Also, patients with tremor due to Parkinson's disease may be treated with Levodopa drug therapy. Symptomatic drug therapy is available for several other tremors as well. For those cases of tremor in which there is no effective drug treatment, physical measures such as teaching the patient to brace the affected limb during the tremor are sometimes useful. Surgical intervention such as thalamotomy or deep brain stimulation may be useful in certain cases.  Document Released: 08/23/2002 Document Revised: 11/25/2011 Document Reviewed: 09/02/2005  ExitCare® Patient Information ©2014 [employer].

## 2014-01-03 NOTE — Progress Notes (Signed)
Reason for visit: Headache  Katie [name] is an 68 y.o. female  History of present illness:  Ms. Faulk is a 68 year old right-handed white female with a history of headaches that come and go 2 or 3 times a week, but she indicates that the headaches are quite brief in nature lasting 15 minutes or so, and the patient is not on medication for the headache. The patient has developed a head and neck tremor that came on within the last several months with a side-to-side head tremor. The patient had a maternal aunt with a similar tremor. The patient indicates that no one in her immediate family however, has a tremor. The patient denies any tremor affecting the arms, and she is already on Inderal taking 40 mg twice daily. The patient recently had a gastric bypass surgery, and she has done well following this. The patient had been on low-dose aspirin for her abnormal MRI of the brain showing what looks like typical ischemic changes in the brainstem, and some deep white matter hemispheric changes as well. MRI done in November 2014 showed a new small punctate frontal lesion that was not present on a scan done 2 years prior. The patient was placed on low-dose aspirin, but she currently is not on this medication.  Past Medical History  Diagnosis Date  . Choledocholithiasis     recurrent  . Bile duct stricture   . Migraine without aura, without mention of intractable migraine without mention of status migrainosus 01/03/2014  . GERD (gastroesophageal reflux disease)     Past Surgical History  Procedure Laterality Date  . Foot surgery      both feet  . Vein ligation and stripping      left leg  . Partial hysterectomy    . Bladder surgery      x 2  . Tubal ligation    . Wrist surgery      right  . Breast lumpectomy      right  . Cholecystectomy    . Explor lap, hepatoduodenostomy  07/23/11    Dr Lazarus Gowda, [facility]  . Gastric bypass  05/04/2013    Family History  Problem Relation Age of Onset  .  Cancer Mother     lung, smoker  . Alcohol abuse Father   . GER disease Sister   . GER disease Brother   . Depression Sister   . Anxiety disorder Sister   . Hypertension Sister   . Migraines Neg Hx     Social history:  reports that she has never smoked. She has never used smokeless tobacco. She reports that she does not drink alcohol or use illicit drugs.   No Known Allergies  Medications:  Current Outpatient Prescriptions on File Prior to Visit  Medication Sig Dispense Refill  . aspirin 81 MG tablet Take 81 mg by mouth daily.      . cyanocobalamin (,VITAMIN B-12,) 1000 MCG/ML injection Inject 1 mL (1,000 mcg total) into the muscle every 30 (thirty) days.  1 mL  6  . esomeprazole (NEXIUM) 40 MG capsule TAKE 1 CAPSULE BY MOUTH TWICE DAILY  60 capsule  3  . propranolol (INDERAL) 40 MG tablet TAKE 1 TABLET BY MOUTH TWICE DAILY  60 tablet  5  . simvastatin (ZOCOR) 80 MG tablet TAKE 1 TABLET BY MOUTH EVERY NIGHT AT BEDTIME  30 tablet  5  . ursodiol (ACTIGALL) 300 MG capsule TAKE 1 CAPSULE BY MOUTH TWICE DAILY  60 capsule  0  .  venlafaxine (EFFEXOR) 37.5 MG tablet TAKE 2 TABLETS BY MOUTH EVERY MORNING AND 1 TABLET BY MOUTH EVERY EVENING  90 tablet  0   No current facility-administered medications on file prior to visit.    ROS:  Out of a complete 14 system review of symptoms, the patient complains only of the following symptoms, and all other reviewed systems are negative.  Hearing loss Blurred vision Dizziness, headache, tremors Muscle cramps Anxiety, hyperactive  Blood pressure 122/65, pulse 56, height 5\' 3"  (1.6 m), weight 126 lb (57.153 kg).  Physical Exam  General: The patient is alert and cooperative at the time of the examination.  Skin: No significant peripheral edema is noted.   Neurologic Exam  Mental status: The patient is oriented x 3.  Cranial nerves: Facial symmetry is present. Speech is normal, no aphasia or dysarthria is noted. Extraocular movements are  full. Visual fields are full. A side-to-side head and neck tremor is noted.  Motor: The patient has good strength in all 4 extremities.  Sensory examination: Soft touch sensation is symmetric on the face, arms, and legs.  Coordination: The patient has good finger-nose-finger and heel-to-shin bilaterally.  Gait and station: The patient has a normal gait. Tandem gait is normal. Romberg is negative. No drift is seen.  Reflexes: Deep tendon reflexes are symmetric.   Assessment/Plan:  1. Head and neck tremor, likely essential tremor  2. History of headache  3. Abnormal MRI of the brain  The patient will have another MRI in about 2 years. The patient is to go back on low-dose aspirin. The patient will not be placed on any medications for her tremor at this time. The patient will followup in about one year.  Jill Alexanders MD 01/03/2014 7:52 PM  [facility] [address] [place], [place] [address]  Phone [phone] Fax [phone]

## 2014-01-05 ENCOUNTER — Ambulatory Visit: Payer: Medicare Other

## 2014-01-20 ENCOUNTER — Other Ambulatory Visit: Payer: Self-pay | Admitting: Family Medicine

## 2014-02-16 ENCOUNTER — Other Ambulatory Visit: Payer: Self-pay | Admitting: Family Medicine

## 2014-02-19 ENCOUNTER — Other Ambulatory Visit: Payer: Self-pay | Admitting: Family Medicine

## 2014-03-06 ENCOUNTER — Other Ambulatory Visit: Payer: Self-pay | Admitting: Family Medicine

## 2014-03-21 ENCOUNTER — Other Ambulatory Visit: Payer: Self-pay | Admitting: Family Medicine

## 2014-04-15 ENCOUNTER — Encounter: Payer: Self-pay | Admitting: Family Medicine

## 2014-04-15 ENCOUNTER — Ambulatory Visit (INDEPENDENT_AMBULATORY_CARE_PROVIDER_SITE_OTHER): Payer: Medicare Other | Admitting: Family Medicine

## 2014-04-15 VITALS — BP 124/64 | HR 68 | Temp 98.3°F | Wt 129.5 lb

## 2014-04-15 DIAGNOSIS — Z9884 Bariatric surgery status: Secondary | ICD-10-CM

## 2014-04-15 DIAGNOSIS — E559 Vitamin D deficiency, unspecified: Secondary | ICD-10-CM

## 2014-04-15 DIAGNOSIS — E7800 Pure hypercholesterolemia, unspecified: Secondary | ICD-10-CM

## 2014-04-15 DIAGNOSIS — M8580 Other specified disorders of bone density and structure, unspecified site: Secondary | ICD-10-CM

## 2014-04-15 DIAGNOSIS — M949 Disorder of cartilage, unspecified: Secondary | ICD-10-CM

## 2014-04-15 DIAGNOSIS — E538 Deficiency of other specified B group vitamins: Secondary | ICD-10-CM

## 2014-04-15 DIAGNOSIS — M899 Disorder of bone, unspecified: Secondary | ICD-10-CM

## 2014-04-15 NOTE — Patient Instructions (Signed)
Go to the lab on the way out.  We'll contact you with your lab report. Take care.

## 2014-04-15 NOTE — Progress Notes (Signed)
Pre visit review using our clinic review tool, if applicable. No additional management support is needed unless otherwise documented below in the visit note.  H/o B12 def prev with IM monthly replacement.  Off replacement for ~2 months.  Had fatigue that responded to B12 tx.  Due for labs.   H/o osteopenia. Not on vit D replacement.   Due for labs.   H/o gastric bypass prev.  Asking about protein sufficiency in diet and cholesterol levels.  Still on statin.  Some muscle cramps in her legs last night but she was exercising earlier in the day.  She didn't get cramps during the exercise.    PMH and SH reviewed  ROS: See HPI, otherwise noncontributory.  Meds, vitals, and allergies reviewed.   GEN: nad, alert and oriented HEENT: mucous membranes moist NECK: supple w/o LA CV: rrr. PULM: ctab, no inc wob ABD: soft, +bs EXT: no edema SKIN: no acute rash

## 2014-04-16 LAB — COMPREHENSIVE METABOLIC PANEL
ALBUMIN: 4.1 g/dL (ref 3.5–5.2)
ALT: 14 U/L (ref 0–35)
AST: 14 U/L (ref 0–37)
Alkaline Phosphatase: 88 U/L (ref 39–117)
BUN: 12 mg/dL (ref 6–23)
CO2: 26 mEq/L (ref 19–32)
Calcium: 9 mg/dL (ref 8.4–10.5)
Chloride: 103 mEq/L (ref 96–112)
Creat: 0.81 mg/dL (ref 0.50–1.10)
GLUCOSE: 75 mg/dL (ref 70–99)
POTASSIUM: 4.7 meq/L (ref 3.5–5.3)
Sodium: 139 mEq/L (ref 135–145)
TOTAL PROTEIN: 6.2 g/dL (ref 6.0–8.3)
Total Bilirubin: 0.6 mg/dL (ref 0.2–1.2)

## 2014-04-16 LAB — LIPID PANEL
Cholesterol: 196 mg/dL (ref 0–200)
HDL: 56 mg/dL (ref >39)
LDL Cholesterol: 108 mg/dL — ABNORMAL HIGH (ref 0–99)
Total CHOL/HDL Ratio: 3.5 Ratio
Triglycerides: 160 mg/dL — ABNORMAL HIGH (ref <150)
VLDL: 32 mg/dL (ref 0–40)

## 2014-04-16 LAB — VITAMIN D 25 HYDROXY (VIT D DEFICIENCY, FRACTURES): VIT D 25 HYDROXY: 42 ng/mL (ref 30–89)

## 2014-04-16 LAB — VITAMIN B12: Vitamin B-12: 467 pg/mL (ref 211–911)

## 2014-04-17 ENCOUNTER — Encounter: Payer: Self-pay | Admitting: Family Medicine

## 2014-04-17 DIAGNOSIS — E785 Hyperlipidemia, unspecified: Secondary | ICD-10-CM | POA: Insufficient documentation

## 2014-04-17 DIAGNOSIS — E559 Vitamin D deficiency, unspecified: Secondary | ICD-10-CM | POA: Insufficient documentation

## 2014-04-17 DIAGNOSIS — Z9884 Bariatric surgery status: Secondary | ICD-10-CM | POA: Insufficient documentation

## 2014-04-17 NOTE — Assessment & Plan Note (Signed)
See notes on labs.  Would restart with every 6 week injection.  Routed to PCP.

## 2014-04-17 NOTE — Assessment & Plan Note (Signed)
LFTs and lipids are acceptable.  See notes on labs.

## 2014-04-17 NOTE — Assessment & Plan Note (Signed)
Acceptable levels now, see notes on labs.

## 2014-04-17 NOTE — Assessment & Plan Note (Signed)
See notes on labs.  Protein level are sufficient.

## 2014-04-18 ENCOUNTER — Other Ambulatory Visit: Payer: Self-pay | Admitting: Family Medicine

## 2014-04-20 ENCOUNTER — Other Ambulatory Visit: Payer: Self-pay | Admitting: Family Medicine

## 2014-04-20 ENCOUNTER — Encounter: Payer: Self-pay | Admitting: *Deleted

## 2014-04-26 ENCOUNTER — Encounter: Payer: Self-pay | Admitting: *Deleted

## 2014-04-27 ENCOUNTER — Telehealth: Payer: Self-pay | Admitting: *Deleted

## 2014-04-27 DIAGNOSIS — E538 Deficiency of other specified B group vitamins: Secondary | ICD-10-CM

## 2014-04-27 MED ORDER — CYANOCOBALAMIN 1000 MCG/ML IJ SOLN: INTRAMUSCULAR | Status: DC

## 2014-04-27 NOTE — Telephone Encounter (Signed)
Spoke to pt and informed her of 04/15/14 results and instruction. B12 inj scheduled.

## 2014-05-11 ENCOUNTER — Ambulatory Visit: Payer: Medicare Other

## 2014-05-18 ENCOUNTER — Other Ambulatory Visit: Payer: Self-pay | Admitting: Family Medicine

## 2014-05-22 ENCOUNTER — Other Ambulatory Visit: Payer: Self-pay | Admitting: Family Medicine

## 2014-05-24 NOTE — Telephone Encounter (Signed)
Ok to fill 

## 2014-06-07 ENCOUNTER — Other Ambulatory Visit: Payer: Self-pay | Admitting: Family Medicine

## 2014-06-24 ENCOUNTER — Other Ambulatory Visit: Payer: Self-pay | Admitting: Family Medicine

## 2014-07-18 ENCOUNTER — Other Ambulatory Visit: Payer: Self-pay | Admitting: *Deleted

## 2014-07-18 MED ORDER — VENLAFAXINE HCL 37.5 MG PO TABS: 75.0000 mg | ORAL_TABLET | ORAL | Status: DC

## 2014-07-18 NOTE — Telephone Encounter (Signed)
Ok to fill 

## 2014-07-29 ENCOUNTER — Other Ambulatory Visit: Payer: Self-pay | Admitting: Family Medicine

## 2014-08-03 ENCOUNTER — Encounter: Payer: Self-pay | Admitting: Neurology

## 2014-08-03 ENCOUNTER — Other Ambulatory Visit: Payer: Self-pay | Admitting: Family Medicine

## 2014-08-09 ENCOUNTER — Encounter: Payer: Self-pay | Admitting: Neurology

## 2014-08-16 ENCOUNTER — Other Ambulatory Visit: Payer: Self-pay | Admitting: Family Medicine

## 2014-08-24 ENCOUNTER — Ambulatory Visit (INDEPENDENT_AMBULATORY_CARE_PROVIDER_SITE_OTHER): Payer: Medicare Other | Admitting: Family Medicine

## 2014-08-24 ENCOUNTER — Encounter: Payer: Self-pay | Admitting: Family Medicine

## 2014-08-24 VITALS — BP 124/68 | HR 60 | Temp 98.1°F | Ht 63.0 in | Wt 132.5 lb

## 2014-08-24 DIAGNOSIS — Z0000 Encounter for general adult medical examination without abnormal findings: Secondary | ICD-10-CM

## 2014-08-24 DIAGNOSIS — D510 Vitamin B12 deficiency anemia due to intrinsic factor deficiency: Secondary | ICD-10-CM

## 2014-08-24 DIAGNOSIS — Z23 Encounter for immunization: Secondary | ICD-10-CM

## 2014-08-24 DIAGNOSIS — E782 Mixed hyperlipidemia: Secondary | ICD-10-CM

## 2014-08-24 DIAGNOSIS — M7062 Trochanteric bursitis, left hip: Secondary | ICD-10-CM | POA: Insufficient documentation

## 2014-08-24 DIAGNOSIS — K831 Obstruction of bile duct: Secondary | ICD-10-CM

## 2014-08-24 DIAGNOSIS — E559 Vitamin D deficiency, unspecified: Secondary | ICD-10-CM

## 2014-08-24 DIAGNOSIS — E538 Deficiency of other specified B group vitamins: Secondary | ICD-10-CM

## 2014-08-24 NOTE — Progress Notes (Signed)
Pre visit review using our clinic review tool, if applicable. No additional management support is needed unless otherwise documented below in the visit note. 

## 2014-08-24 NOTE — Patient Instructions (Addendum)
Great to see you. Katie [name]  We will call you with your lab results.  Check with your insurance to see if they will cover the shingles shot.

## 2014-08-24 NOTE — Assessment & Plan Note (Signed)
The patients weight, height, BMI and visual acuity have been recorded in the chart I have made referrals, counseling and provided education to the patient based review of the above and I have provided the pt with a written personalized care plan for preventive services.  Prevnar 13 given today. She will call her insurance company regarding zostavax coverage.  Orders Placed This Encounter  Procedures  . Pneumococcal conjugate vaccine 13-valent IM  . Vitamin B12  . Vitamin D, 25-hydroxy  . CBC with Differential  . Comprehensive metabolic panel  . Lipid panel  . TSH

## 2014-08-24 NOTE — Progress Notes (Signed)
Subjective:    Patient ID: Katie [name], female    DOB: 07-03-1946, 68 y.o.   MRN: [id number]  HPI  68 yo pleasant female here for initial medicare wellness visit.  I have personally reviewed the Medicare Annual Wellness questionnaire and have noted 1. The patient's medical and social history 2. Their use of alcohol, tobacco or illicit drugs 3. Their current medications and supplements 4. The patient's functional ability including ADL's, fall risks, home safety risks and hearing or visual             impairment. 5. Diet and physical activities 6. Evidence for depression or mood disorders  End of life wishes discussed and updated in Social History.  The roster of all physicians providing medical care to patient - is listed in the Snapshot section of the chart.  Doing well- still exercising regularly and volunteering at [facility].  Colonoscopy 01/09/06- Dr .Carlean Purl, 10 year recall Td 12/08/08 Mammogram 10/15/13 Flu vaccine in 06/2014 at work Remote history of hysterectomy   Persistent biliary issues- s/p multiple ERCPs, biliary bypass.  Had gastric bypass in 04/2013 due to this.  Feels much better.  Has lost weight but feels this was more due to liquid diet.  Size of stomach about the same (awaiting records).  Wt Readings from Last 3 Encounters:  08/24/14 132 lb 8 oz (60.102 kg)  04/15/14 129 lb 8 oz (58.741 kg)  01/03/14 126 lb (57.153 kg)    B12 deficiency- previous Dr. Council Mechanic pt.  Has been receiving B12 injections for years.  Currently receiving injections every 6 weeks.  Has been a little more tired than usual. Lab Results  Component Value Date   VITAMINB12 467 04/15/2014    HLD-On Zocor 80 mg daily, started taking 40 mg in July based on my recommendations as her cholesterol was improving. Lab Results  Component Value Date   CHOL 196 04/15/2014   HDL 56 04/15/2014   LDLCALC 108* 04/15/2014   LDLDIRECT 115.1 03/25/2013   TRIG 160* 04/15/2014   CHOLHDL 3.5  04/15/2014   Lab Results  Component Value Date   ALT 14 04/15/2014   AST 14 04/15/2014   ALKPHOS 88 04/15/2014   BILITOT 0.6 04/15/2014     Patient Active Problem List   Diagnosis Date Noted  . Medicare annual wellness visit, subsequent 08/24/2014  . H/O gastric bypass 04/17/2014  . Pure hypercholesterolemia 04/17/2014  . Unspecified vitamin D deficiency 04/17/2014  . Migraine without aura, without mention of intractable migraine without mention of status migrainosus 01/03/2014  . Visual changes 11/08/2013  . Benign head tremor 11/08/2013  . Dizziness and giddiness 02/05/2013  . B12 deficiency 04/03/2012  . ABDOMINAL PAIN RIGHT UPPER QUADRANT 04/13/2010  . GERD 05/11/2008  . CHOLEDOCHOLITHIASIS 04/26/2008  . BILE DUCT STRICTURE 04/26/2008  . HYPERLIPIDEMIA, MIXED 03/13/2007  . ANEMIA, PERNICIOUS 03/13/2007  . Depression 02/13/1995   Past Medical History  Diagnosis Date  . Choledocholithiasis     recurrent  . Bile duct stricture   . Migraine without aura, without mention of intractable migraine without mention of status migrainosus 01/03/2014  . GERD (gastroesophageal reflux disease)    Past Surgical History  Procedure Laterality Date  . Foot surgery      both feet  . Vein ligation and stripping      left leg  . Partial hysterectomy    . Bladder surgery      x 2  . Tubal ligation    . Wrist surgery  right  . Breast lumpectomy      right  . Cholecystectomy    . Explor lap, hepatoduodenostomy  07/23/11    Dr Lazarus Gowda, [facility]  . Gastric bypass  05/04/2013   History  Substance Use Topics  . Smoking status: Never Smoker   . Smokeless tobacco: Never Used  . Alcohol Use: No   Family History  Problem Relation Age of Onset  . Cancer Mother     lung, smoker  . Alcohol abuse Father   . GER disease Sister   . GER disease Brother   . Depression Sister   . Anxiety disorder Sister   . Hypertension Sister   . Migraines Neg Hx    No Known Allergies Current  Outpatient Prescriptions on File Prior to Visit  Medication Sig Dispense Refill  . aspirin 81 MG tablet Take 81 mg by mouth daily.    . cyanocobalamin (,VITAMIN B-12,) 1000 MCG/ML injection INJECT 1 ML IN THE MUSCLE EVERY 6 WEEKS AS DIRECTED 1 mL 0  . esomeprazole (NEXIUM) 40 MG capsule TAKE 1 CAPSULE BY MOUTH TWICE DAILY 60 capsule 5  . Multiple Vitamin (MULTIVITAMIN) tablet Take 1 tablet by mouth daily.    . propranolol (INDERAL) 40 MG tablet TAKE 1 TABLET BY MOUTH TWICE DAILY 60 tablet 2  . simvastatin (ZOCOR) 80 MG tablet TAKE 1 TABLET BY MOUTH EVERY NIGHT AT BEDTIME 30 tablet 2  . ursodiol (ACTIGALL) 300 MG capsule TAKE ONE CAPSULE BY MOUTH TWICE DAILY 60 capsule 1  . venlafaxine (EFFEXOR) 37.5 MG tablet Take 2 tablets (75 mg total) by mouth every morning. 1 tablet in the evening 90 tablet 1   No current facility-administered medications on file prior to visit.   The PMH, PSH, Social History, Family History, Medications, and allergies have been reviewed in [facility], and have been updated if relevant.    Review of Systems See HPI  + left hip pain intermittently + head tremors- improved when not under stress No changes in bowel habits or blood in stool No urinary issues No CP or SOB Denies feeling or anxious Appetite good- Wt Readings from Last 3 Encounters:  08/24/14 132 lb 8 oz (60.102 kg)  04/15/14 129 lb 8 oz (58.741 kg)  01/03/14 126 lb (57.153 kg)    Objective:   Physical Exam BP 124/68 mmHg  Pulse 60  Temp(Src) 98.1 F (36.7 C) (Oral)  Ht 5\' 3"  (1.6 m)  Wt 132 lb 8 oz (60.102 kg)  BMI 23.48 kg/m2  SpO2 97%  General:  Well-developed,well-nourished,in no acute distress; alert,appropriate and cooperative throughout examination Head:  normocephalic and atraumatic.   Eyes:  vision grossly intact, pupils equal, pupils round, and pupils reactive to light.   Ears:  R ear normal and L ear normal.   Nose:  no external deformity.   Mouth:  good dentition.   Lungs:  Normal  respiratory effort, chest expands symmetrically. Lungs are clear to auscultation, no crackles or wheezes. Heart:  Normal rate and regular rhythm. S1 and S2 normal without gallop, murmur, click, rub or other extra sounds. Abdomen:  Bowel sounds positive,abdomen soft and non-tender without masses, organomegaly or hernias noted. Msk:  No deformity or scoliosis noted of thoracic or lumbar spine.   TTP over left trochanteric bursa No pain with external or internal rotation Extremities:  No clubbing, cyanosis, edema, or deformity noted with normal full range of motion of all joints.   Neurologic:  alert & oriented X3 and gait normal. Normal strength  in all four extremities   Skin:  Intact without suspicious lesions or rashes Psych:  Cognition and judgment appear intact. Alert and cooperative with normal attention span and concentration. No apparent delusions, illusions, hallucinations    Assessment & Plan:

## 2014-08-24 NOTE — Assessment & Plan Note (Signed)
Recheck lipid panel today. I am pleased that she decreased dose of simvastatin to 40 mg daily.

## 2014-08-24 NOTE — Addendum Note (Signed)
Addended by: Ellamae Sia on: 08/24/2014 04:05 PM   Modules accepted: Orders

## 2014-08-24 NOTE — Assessment & Plan Note (Signed)
Intermittent. Discussed prn Tylenol and staying active. She will call me if symptoms progress.

## 2014-08-24 NOTE — Assessment & Plan Note (Signed)
Recheck B12 today.   

## 2014-08-26 ENCOUNTER — Other Ambulatory Visit (INDEPENDENT_AMBULATORY_CARE_PROVIDER_SITE_OTHER): Payer: Medicare Other

## 2014-08-26 DIAGNOSIS — E780 Pure hypercholesterolemia: Secondary | ICD-10-CM

## 2014-08-26 DIAGNOSIS — D510 Vitamin B12 deficiency anemia due to intrinsic factor deficiency: Secondary | ICD-10-CM

## 2014-08-26 DIAGNOSIS — Z0000 Encounter for general adult medical examination without abnormal findings: Secondary | ICD-10-CM

## 2014-08-26 DIAGNOSIS — Z23 Encounter for immunization: Secondary | ICD-10-CM

## 2014-08-26 DIAGNOSIS — E782 Mixed hyperlipidemia: Secondary | ICD-10-CM

## 2014-08-26 DIAGNOSIS — E559 Vitamin D deficiency, unspecified: Secondary | ICD-10-CM

## 2014-08-26 DIAGNOSIS — K831 Obstruction of bile duct: Secondary | ICD-10-CM

## 2014-08-26 DIAGNOSIS — M7062 Trochanteric bursitis, left hip: Secondary | ICD-10-CM

## 2014-08-26 DIAGNOSIS — E538 Deficiency of other specified B group vitamins: Secondary | ICD-10-CM

## 2014-08-26 DIAGNOSIS — E785 Hyperlipidemia, unspecified: Secondary | ICD-10-CM

## 2014-08-26 LAB — CBC WITH DIFFERENTIAL/PLATELET
BASOS ABS: 0 10*3/uL (ref 0.0–0.1)
Basophils Relative: 0.4 % (ref 0.0–3.0)
EOS ABS: 0.3 10*3/uL (ref 0.0–0.7)
Eosinophils Relative: 5.3 % — ABNORMAL HIGH (ref 0.0–5.0)
HCT: 38.4 % (ref 36.0–46.0)
Hemoglobin: 13.1 g/dL (ref 12.0–15.0)
LYMPHS PCT: 15.3 % (ref 12.0–46.0)
Lymphs Abs: 0.8 10*3/uL (ref 0.7–4.0)
MCHC: 34.2 g/dL (ref 30.0–36.0)
MCV: 92.3 fl (ref 78.0–100.0)
Monocytes Absolute: 0.5 10*3/uL (ref 0.1–1.0)
Monocytes Relative: 8.9 % (ref 3.0–12.0)
Neutro Abs: 3.6 10*3/uL (ref 1.4–7.7)
Neutrophils Relative %: 70.1 % (ref 43.0–77.0)
Platelets: 264 10*3/uL (ref 150.0–400.0)
RBC: 4.16 Mil/uL (ref 3.87–5.11)
RDW: 12.6 % (ref 11.5–15.5)
WBC: 5.1 10*3/uL (ref 4.0–10.5)

## 2014-08-26 LAB — LDL CHOLESTEROL, DIRECT: LDL DIRECT: 144.7 mg/dL

## 2014-08-26 LAB — COMPREHENSIVE METABOLIC PANEL
ALBUMIN: 4 g/dL (ref 3.5–5.2)
ALK PHOS: 95 U/L (ref 39–117)
ALT: 16 U/L (ref 0–35)
AST: 16 U/L (ref 0–37)
BILIRUBIN TOTAL: 0.7 mg/dL (ref 0.2–1.2)
BUN: 14 mg/dL (ref 6–23)
CO2: 25 mEq/L (ref 19–32)
Calcium: 9.5 mg/dL (ref 8.4–10.5)
Chloride: 108 mEq/L (ref 96–112)
Creatinine, Ser: 0.8 mg/dL (ref 0.4–1.2)
GFR: 71.83 mL/min (ref >60.00)
GLUCOSE: 92 mg/dL (ref 70–99)
POTASSIUM: 4.9 meq/L (ref 3.5–5.1)
Sodium: 139 mEq/L (ref 135–145)
Total Protein: 7 g/dL (ref 6.0–8.3)

## 2014-08-26 LAB — LIPID PANEL
CHOL/HDL RATIO: 4
Cholesterol: 240 mg/dL — ABNORMAL HIGH (ref 0–200)
HDL: 60.4 mg/dL (ref >39.00)
NonHDL: 179.6
Triglycerides: 254 mg/dL — ABNORMAL HIGH (ref 0.0–149.0)
VLDL: 50.8 mg/dL — ABNORMAL HIGH (ref 0.0–40.0)

## 2014-08-26 LAB — VITAMIN B12: VITAMIN B 12: 222 pg/mL (ref 211–911)

## 2014-08-26 LAB — VITAMIN D 25 HYDROXY (VIT D DEFICIENCY, FRACTURES): VITD: 30.69 ng/mL (ref 30.00–100.00)

## 2014-08-26 LAB — TSH: TSH: 3.21 u[IU]/mL (ref 0.35–4.50)

## 2014-08-31 ENCOUNTER — Encounter: Payer: Self-pay | Admitting: Internal Medicine

## 2014-08-31 ENCOUNTER — Ambulatory Visit (INDEPENDENT_AMBULATORY_CARE_PROVIDER_SITE_OTHER): Payer: Medicare Other | Admitting: Internal Medicine

## 2014-08-31 ENCOUNTER — Encounter: Payer: Self-pay | Admitting: Family Medicine

## 2014-08-31 VITALS — BP 112/60 | HR 63 | Temp 98.2°F | Wt 128.0 lb

## 2014-08-31 DIAGNOSIS — J069 Acute upper respiratory infection, unspecified: Secondary | ICD-10-CM

## 2014-08-31 MED ORDER — AZITHROMYCIN 250 MG PO TABS: ORAL_TABLET | ORAL | Status: DC

## 2014-08-31 NOTE — Patient Instructions (Signed)
Upper Respiratory Infection, Adult An upper respiratory infection (URI) is also sometimes known as the common cold. The upper respiratory tract includes the nose, sinuses, throat, trachea, and bronchi. Bronchi are the airways leading to the lungs. Most people improve within 1 week, but symptoms can last up to 2 weeks. A residual cough may last even longer.  CAUSES Many different viruses can infect the tissues lining the upper respiratory tract. The tissues become irritated and inflamed and often become very moist. Mucus production is also common. A cold is contagious. You can easily spread the virus to others by oral contact. This includes kissing, sharing a glass, coughing, or sneezing. Touching your mouth or nose and then touching a surface, which is then touched by another person, can also spread the virus. SYMPTOMS  Symptoms typically develop 1 to 3 days after you come in contact with a cold virus. Symptoms vary from person to person. They may include:  Runny nose.  Sneezing.  Nasal congestion.  Sinus irritation.  Sore throat.  Loss of voice (laryngitis).  Cough.  Fatigue.  Muscle aches.  Loss of appetite.  Headache.  Low-grade fever. DIAGNOSIS  You might diagnose your own cold based on familiar symptoms, since most people get a cold 2 to 3 times a year. Your caregiver can confirm this based on your exam. Most importantly, your caregiver can check that your symptoms are not due to another disease such as strep throat, sinusitis, pneumonia, asthma, or epiglottitis. Blood tests, throat tests, and X-rays are not necessary to diagnose a common cold, but they may sometimes be helpful in excluding other more serious diseases. Your caregiver will decide if any further tests are required. RISKS AND COMPLICATIONS  You may be at risk for a more severe case of the common cold if you smoke cigarettes, have chronic heart disease (such as heart failure) or lung disease (such as asthma), or if  you have a weakened immune system. The very young and very old are also at risk for more serious infections. Bacterial sinusitis, middle ear infections, and bacterial pneumonia can complicate the common cold. The common cold can worsen asthma and chronic obstructive pulmonary disease (COPD). Sometimes, these complications can require emergency medical care and may be life-threatening. PREVENTION  The best way to protect against getting a cold is to practice good hygiene. Avoid oral or hand contact with people with cold symptoms. Wash your hands often if contact occurs. There is no clear evidence that vitamin C, vitamin E, echinacea, or exercise reduces the chance of developing a cold. However, it is always recommended to get plenty of rest and practice good nutrition. TREATMENT  Treatment is directed at relieving symptoms. There is no cure. Antibiotics are not effective, because the infection is caused by a virus, not by bacteria. Treatment may include:  Increased fluid intake. Sports drinks offer valuable electrolytes, sugars, and fluids.  Breathing heated mist or steam (vaporizer or shower).  Eating chicken soup or other clear broths, and maintaining good nutrition.  Getting plenty of rest.  Using gargles or lozenges for comfort.  Controlling fevers with ibuprofen or acetaminophen as directed by your caregiver.  Increasing usage of your inhaler if you have asthma. Zinc gel and zinc lozenges, taken in the first 24 hours of the common cold, can shorten the duration and lessen the severity of symptoms. Pain medicines may help with fever, muscle aches, and throat pain. A variety of non-prescription medicines are available to treat congestion and runny nose. Your caregiver   can make recommendations and may suggest nasal or lung inhalers for other symptoms.  HOME CARE INSTRUCTIONS   Only take over-the-counter or prescription medicines for pain, discomfort, or fever as directed by your  caregiver.  Use a warm mist humidifier or inhale steam from a shower to increase air moisture. This may keep secretions moist and make it easier to breathe.  Drink enough water and fluids to keep your urine clear or pale yellow.  Rest as needed.  Return to work when your temperature has returned to normal or as your caregiver advises. You may need to stay home longer to avoid infecting others. You can also use a face mask and careful hand washing to prevent spread of the virus. SEEK MEDICAL CARE IF:   After the first few days, you feel you are getting worse rather than better.  You need your caregiver's advice about medicines to control symptoms.  You develop chills, worsening shortness of breath, or brown or red sputum. These may be signs of pneumonia.  You develop yellow or brown nasal discharge or pain in the face, especially when you bend forward. These may be signs of sinusitis.  You develop a fever, swollen neck glands, pain with swallowing, or white areas in the back of your throat. These may be signs of strep throat. SEEK IMMEDIATE MEDICAL CARE IF:   You have a fever.  You develop severe or persistent headache, ear pain, sinus pain, or chest pain.  You develop wheezing, a prolonged cough, cough up blood, or have a change in your usual mucus (if you have chronic lung disease).  You develop sore muscles or a stiff neck. Document Released: 02/26/2001 Document Revised: 11/25/2011 Document Reviewed: 12/08/2013 Exit[employer] Patient Information 2015 [employer]. This information is not intended to replace advice given to you by your health care provider. Make sure you discuss any questions you have with your health care provider.  

## 2014-08-31 NOTE — Progress Notes (Signed)
HPI  Pt presents to the clinic today with c/o runny nose, cough and chest congestion. She reports this started about 5 days ago. The cough is productive of yellow/green mucous. She is blowing clear mucous out of her nose. She has run low grade fevers and felt fatigued. She has tried Mucinex, Tussionex and Ibuprofen OTC. She has no history of allergies or breathing problems. She has had sick contacts. She does not smoke.  Review of Systems    Past Medical History  Diagnosis Date  . Choledocholithiasis     recurrent  . Bile duct stricture   . Migraine without aura, without mention of intractable migraine without mention of status migrainosus 01/03/2014  . GERD (gastroesophageal reflux disease)     Family History  Problem Relation Age of Onset  . Cancer Mother     lung, smoker  . Alcohol abuse Father   . GER disease Sister   . GER disease Brother   . Depression Sister   . Anxiety disorder Sister   . Hypertension Sister   . Migraines Neg Hx     History   Social History  . Marital Status: Married    Spouse Name: N/A    Number of Children: 2  . Years of Education: college   Occupational History  . retired   .     Social History Main Topics  . Smoking status: Never Smoker   . Smokeless tobacco: Never Used  . Alcohol Use: No  . Drug Use: No  . Sexual Activity: Not on file   Other Topics Concern  . Not on file   Social History Narrative   Does not have a living will or HPOA.   Desires CPR.   Does not want prolonged life support.    No Known Allergies   Constitutional:  Pt reports headache, fatigue, and fever.  HEENT:  Positive runny nose. Denies eye redness, eye pain, pressure behind the eyes, facial pain, nasal congestion, ear pain, ringing in the ears, wax buildup, or sore throat. Respiratory: Positive cough. Denies difficulty breathing or shortness of breath.  Cardiovascular: Denies chest pain, chest tightness, palpitations or swelling in the hands or feet.    No other specific complaints in a complete review of systems (except as listed in HPI above).  Objective:   BP 112/60 mmHg  Pulse 63  Temp(Src) 98.2 F (36.8 C) (Oral)  Wt 128 lb (58.06 kg)  SpO2 98%   Wt Readings from Last 3 Encounters:  08/31/14 128 lb (58.06 kg)  08/24/14 132 lb 8 oz (60.102 kg)  04/15/14 129 lb 8 oz (58.741 kg)     General: Appears her stated age, ill appearing in NAD. HEENT: Head: normal shape and size, no sinus tenderness noted; Eyes: sclera white, no icterus, conjunctiva pink; Right Ear: cerumen impaction; Left Ear: Tm's pink but intact, normal light reflex + serous effusion; Nose: mucosa pink and moist, septum midline; Throat/Mouth: + PND. Teeth present, mucosa erythematous and moist, no exudate noted, no lesions or ulcerations noted.  Neck: No adenopathy noted Cardiovascular: Normal rate and rhythm. S1,S2 noted.  No murmur, rubs or gallops noted.  Pulmonary/Chest: Normal effort and positive vesicular breath sounds. No respiratory distress. No wheezes, rales or ronchi noted.      Assessment & Plan:   Upper Respiratory Infection:  Get some rest and drink plenty of water Continue Ibuprofen and Tussionex eRx for Azithromax x 5 days Work note provided  RTC as needed or if symptoms persist.

## 2014-09-04 ENCOUNTER — Other Ambulatory Visit: Payer: Self-pay | Admitting: Family Medicine

## 2014-09-12 ENCOUNTER — Other Ambulatory Visit: Payer: Self-pay | Admitting: Family Medicine

## 2014-09-29 ENCOUNTER — Other Ambulatory Visit: Payer: Self-pay | Admitting: Family Medicine

## 2014-09-30 ENCOUNTER — Other Ambulatory Visit: Payer: Self-pay | Admitting: Family Medicine

## 2014-10-03 ENCOUNTER — Other Ambulatory Visit: Payer: Self-pay | Admitting: Family Medicine

## 2014-10-05 ENCOUNTER — Other Ambulatory Visit: Payer: Self-pay | Admitting: Family Medicine

## 2014-10-14 ENCOUNTER — Other Ambulatory Visit: Payer: Self-pay

## 2014-10-14 DIAGNOSIS — Z1231 Encounter for screening mammogram for malignant neoplasm of breast: Secondary | ICD-10-CM

## 2014-10-18 ENCOUNTER — Other Ambulatory Visit: Payer: Self-pay | Admitting: Family Medicine

## 2014-10-19 ENCOUNTER — Ambulatory Visit: Payer: Self-pay

## 2014-10-31 ENCOUNTER — Ambulatory Visit: Payer: Self-pay

## 2014-11-03 ENCOUNTER — Other Ambulatory Visit: Payer: Self-pay | Admitting: Family Medicine

## 2014-11-04 ENCOUNTER — Ambulatory Visit
Admission: RE | Admit: 2014-11-04 | Discharge: 2014-11-04 | Disposition: A | Discharge status: Home / Self Care | Payer: Medicare Other | Source: Ambulatory Visit

## 2014-11-04 DIAGNOSIS — Z1231 Encounter for screening mammogram for malignant neoplasm of breast: Secondary | ICD-10-CM

## 2014-11-14 ENCOUNTER — Other Ambulatory Visit: Payer: Self-pay | Admitting: Family Medicine

## 2014-12-17 ENCOUNTER — Other Ambulatory Visit: Payer: Self-pay | Admitting: Family Medicine

## 2015-01-04 ENCOUNTER — Ambulatory Visit: Payer: Medicare Other | Admitting: Neurology

## 2015-01-07 ENCOUNTER — Other Ambulatory Visit: Payer: Self-pay | Admitting: Family Medicine

## 2015-01-16 ENCOUNTER — Other Ambulatory Visit: Payer: Self-pay | Admitting: Family Medicine

## 2015-02-01 ENCOUNTER — Other Ambulatory Visit: Payer: Self-pay | Admitting: Family Medicine

## 2015-02-03 ENCOUNTER — Ambulatory Visit (INDEPENDENT_AMBULATORY_CARE_PROVIDER_SITE_OTHER): Payer: Medicare Other | Admitting: Nurse Practitioner

## 2015-02-03 ENCOUNTER — Encounter: Payer: Self-pay | Admitting: Nurse Practitioner

## 2015-02-03 VITALS — BP 109/59 | HR 59 | Ht 64.0 in | Wt 128.5 lb

## 2015-02-03 DIAGNOSIS — G43009 Migraine without aura, not intractable, without status migrainosus: Secondary | ICD-10-CM | POA: Diagnosis not present

## 2015-02-03 DIAGNOSIS — G250 Essential tremor: Secondary | ICD-10-CM

## 2015-02-03 NOTE — Patient Instructions (Signed)
Continue aspirin 1 daily Head tremor is stable Headaches are stable Follow-up yearly MRI next year

## 2015-02-03 NOTE — Progress Notes (Addendum)
[facility]  PATIENT: Katie [name] DOB: March 20, 1946   REASON FOR VISIT: Follow-up for head tremor, headache  HISTORY FROM: Patient    HISTORY OF PRESENT ILLNESS:[name] is a 69 year old right-handed white female with a history of headaches that come and go 2 or 3 times a week, but she indicates that the headaches are quite brief in nature lasting 5 minutes or so, and the patient is not on medication for the headache. The patient has developed a head and neck tremor that came on about a year and a half ago it is a  side-to-side head tremor which is intermittent. The patient had a maternal aunt with a similar tremor. The patient indicates that no one in her immediate family however, has a tremor. The patient denies any tremor affecting the arms, and she is already on Inderal taking 40 mg twice daily. The patient  had a gastric bypass surgery, and she has done well following this. Abnormal MRI of the brain showing  typical ischemic changes in the brainstem, and some deep white matter hemispheric changes as well. MRI done in November 2014 showed a new small punctate frontal lesion that was not present on a scan done 2 years prior. The patient was placed on low-dose aspirin. She is to have repeat MRI in 2017. She returns for reevaluation   REVIEW OF SYSTEMS: Full 14 system review of systems performed and notable only for those listed, all others are neg:  Constitutional: neg  Cardiovascular: neg Ear/Nose/Throat: neg  Skin: neg Eyes: Blurred vision Respiratory: neg Gastroitestinal: neg  Hematology/Lymphatic: neg  Endocrine: neg Musculoskeletal:neg Allergy/Immunology: neg Neurological: Occasional headache, occasional head tremor  Psychiatric: Anxiety at times Sleep : neg   ALLERGIES: No Known Allergies  HOME MEDICATIONS: Outpatient Prescriptions Prior to Visit  Medication Sig Dispense Refill  . aspirin 81 MG tablet Take 81 mg by mouth daily.    . cyanocobalamin  (,VITAMIN B-12,) 1000 MCG/ML injection INJECT 1 ML IN THE MUSCLE EVERY 6 WEEKS AS DIRECTED 1 mL 0  . esomeprazole (NEXIUM) 40 MG capsule TAKE 1 CAPSULE BY MOUTH TWICE DAILY 60 capsule 5  . Multiple Vitamin (MULTIVITAMIN) tablet Take 1 tablet by mouth daily.    . propranolol (INDERAL) 40 MG tablet TAKE 1 TABLET BY MOUTH TWICE DAILY 60 tablet 9  . simvastatin (ZOCOR) 80 MG tablet TAKE 1 TABLET BY MOUTH EVERY NIGHT AT BEDTIME 30 tablet 3  . ursodiol (ACTIGALL) 300 MG capsule TAKE ONE CAPSULE BY MOUTH TWICE DAILY 60 capsule 0  . venlafaxine (EFFEXOR) 37.5 MG tablet TAKE 2 TABLETS BY MOUTH EVERY MORNING AND 1 TABLET EVERY EVENING 90 tablet 6  . azithromycin (ZITHROMAX) 250 MG tablet Take 2 tabs today, then 1 tab daily x 4 days 6 tablet 0   No facility-administered medications prior to visit.    PAST MEDICAL HISTORY: Past Medical History  Diagnosis Date  . Choledocholithiasis     recurrent  . Bile duct stricture   . Migraine without aura, without mention of intractable migraine without mention of status migrainosus 01/03/2014  . GERD (gastroesophageal reflux disease)     PAST SURGICAL HISTORY: Past Surgical History  Procedure Laterality Date  . Foot surgery      both feet  . Vein ligation and stripping      left leg  . Partial hysterectomy    . Bladder surgery      x 2  . Tubal ligation    . Wrist surgery  right  . Breast lumpectomy      right  . Cholecystectomy    . Explor lap, hepatoduodenostomy  07/23/11    Dr Lazarus Gowda, [facility]  . Gastric bypass  05/04/2013    FAMILY HISTORY: Family History  Problem Relation Age of Onset  . Cancer Mother     lung, smoker  . Alcohol abuse Father   . GER disease Sister   . GER disease Brother   . Depression Sister   . Anxiety disorder Sister   . Hypertension Sister   . Migraines Neg Hx     SOCIAL HISTORY: History   Social History  . Marital Status: Married    Spouse Name: N/A  . Number of Children: 2  . Years of Education:  college   Occupational History  . retired   .     Social History Main Topics  . Smoking status: Never Smoker   . Smokeless tobacco: Never Used  . Alcohol Use: No  . Drug Use: No  . Sexual Activity: Not on file   Other Topics Concern  . Not on file   Social History Narrative   Does not have a living will or HPOA.   Desires CPR.   Does not want prolonged life support.     PHYSICAL EXAM  Filed Vitals:   02/03/15 1044  BP: 109/59  Pulse: 59  Height: 5\' 4"  (1.626 m)  Weight: 128 lb 8 oz (58.287 kg)   Body mass index is 22.05 kg/(m^2). General: The patient is alert and cooperative at the time of the examination. Skin: No significant peripheral edema is noted.   Neurologic Exam  Mental status: The patient is oriented x 3. Cranial nerves: Facial symmetry is present. Speech is normal, no aphasia or dysarthria is noted. Extraocular movements are full. Visual fields are full. A side-to-side head and neck tremor is noted which is intermittent. Motor: The patient has good strength in all 4 extremities. No focal weakness Sensory examination: Soft touch sensation is symmetric on the face, arms, and legs. Coordination: The patient has good finger-nose-finger and heel-to-shin bilaterally. Gait and station: The patient has a normal gait. Tandem gait is normal. Romberg is negative. No drift is seen. Reflexes: Deep tendon reflexes are symmetric.    DIAGNOSTIC DATA (LABS, IMAGING, TESTING) - I reviewed patient records, labs, notes, testing and imaging myself where available.  Lab Results  Component Value Date   WBC 5.1 08/26/2014   HGB 13.1 08/26/2014   HCT 38.4 08/26/2014   MCV 92.3 08/26/2014   PLT 264.0 08/26/2014      Component Value Date/Time   NA 139 08/26/2014 1046   K 4.9 08/26/2014 1046   CL 108 08/26/2014 1046   CO2 25 08/26/2014 1046   GLUCOSE 92 08/26/2014 1046   BUN 14 08/26/2014 1046   CREATININE 0.8 08/26/2014 1046   CREATININE 0.81 04/15/2014 1320    CALCIUM 9.5 08/26/2014 1046   PROT 7.0 08/26/2014 1046   ALBUMIN 4.0 08/26/2014 1046   AST 16 08/26/2014 1046   ALT 16 08/26/2014 1046   ALKPHOS 95 08/26/2014 1046   BILITOT 0.7 08/26/2014 1046   GFRNONAA 75.97 02/21/2010 1002   GFRAA 82 12/16/2007 1121   Lab Results  Component Value Date   CHOL 240* 08/26/2014   HDL 60.40 08/26/2014   LDLCALC 108* 04/15/2014   LDLDIRECT 144.7 08/26/2014   TRIG 254.0* 08/26/2014   CHOLHDL 4 08/26/2014   No results found for: HGBA1C Lab Results  Component Value  Date   SHUOHFGB02 111 08/26/2014   Lab Results  Component Value Date   TSH 3.21 08/26/2014      ASSESSMENT AND PLAN  69 y.o. year old female  has a past medical history of head and neck tremor likely essential tremor, history of headache, abnormal MRI of the brain in the past . The patient is a current patient of Dr. Jannifer Franklin  who is out of the office today . This note is sent to the work in doctor.     Continue aspirin .81 daily Head tremor is stable Headaches are stable Follow-up yearly repeat  MRI of the brain next year Dennie Bible, [facility], [facility], [facility] [address] [place], [place] [address] [phone]  I reviewed the above note and documentation by the Nurse Practitioner and agree with the history, physical exam, assessment and plan as outlined above. I was immediately available for face-to-face consultation. Star Age, MD, PhD [facility])

## 2015-02-13 ENCOUNTER — Other Ambulatory Visit: Payer: Self-pay | Admitting: Family Medicine

## 2015-03-03 ENCOUNTER — Encounter: Payer: Self-pay | Admitting: Family Medicine

## 2015-03-03 ENCOUNTER — Ambulatory Visit (INDEPENDENT_AMBULATORY_CARE_PROVIDER_SITE_OTHER)
Admission: RE | Admit: 2015-03-03 | Discharge: 2015-03-03 | Disposition: A | Discharge status: Home / Self Care | Payer: Medicare Other | Source: Ambulatory Visit | Attending: Family Medicine | Admitting: Family Medicine

## 2015-03-03 ENCOUNTER — Ambulatory Visit (INDEPENDENT_AMBULATORY_CARE_PROVIDER_SITE_OTHER): Payer: Medicare Other | Admitting: Family Medicine

## 2015-03-03 VITALS — BP 116/62 | HR 73 | Temp 98.3°F | Ht 64.0 in | Wt 127.1 lb

## 2015-03-03 DIAGNOSIS — M25532 Pain in left wrist: Secondary | ICD-10-CM

## 2015-03-03 DIAGNOSIS — M25572 Pain in left ankle and joints of left foot: Secondary | ICD-10-CM

## 2015-03-03 NOTE — Assessment & Plan Note (Signed)
Point tender at base of left thumb. Check xray today. If normal anticipate bony contusion and will recommend supportive wrist brace.  Discussed anticipated course of resolution.

## 2015-03-03 NOTE — Progress Notes (Signed)
BP 116/62 mmHg  Pulse 73  Temp(Src) 98.3 F (36.8 C) (Oral)  Ht 5\' 4"  (1.626 m)  Wt 127 lb 1.9 oz (57.661 kg)  BMI 21.81 kg/m2  SpO2 97%   CC: foot pain, wrist pain  Subjective:    Patient ID: Katie [name], female    DOB: 09/18/1945, 69 y.o.   MRN: [id number]  HPI: Katie [name] is a 69 y.o. female presenting on 03/03/2015 for Foot Pain and Wrist Pain   02/10/2015 while walking in shopping [place] at [facility] twisted L foot inversion injury. Foot swelling. Has iced foot and used ankle brace which didn't really help. Has tried ibuprofen for this. No h/o foot problems in the past. Denies paresthesias of foot.   L wrist staying sore - she hit wrist into window. Tender lateral aspect of wrist. Mild swelling noted. Supination or flexion at wrist seems to worsen pain.   Relevant past medical, surgical, family and social history reviewed and updated as indicated. Interim medical history since our last visit reviewed. Allergies and medications reviewed and updated. Current Outpatient Prescriptions on File Prior to Visit  Medication Sig  . aspirin 81 MG tablet Take 81 mg by mouth daily.  . Biotin 1 MG CAPS Take by mouth.  . cyanocobalamin (,VITAMIN B-12,) 1000 MCG/ML injection INJECT 1 ML IN THE MUSCLE EVERY 6 WEEKS AS DIRECTED  . esomeprazole (NEXIUM) 40 MG capsule TAKE 1 CAPSULE BY MOUTH TWICE DAILY  . Multiple Vitamin (MULTIVITAMIN) tablet Take 1 tablet by mouth daily.  . propranolol (INDERAL) 40 MG tablet TAKE 1 TABLET BY MOUTH TWICE DAILY  . simvastatin (ZOCOR) 80 MG tablet TAKE 1 TABLET BY MOUTH EVERY NIGHT AT BEDTIME  . ursodiol (ACTIGALL) 300 MG capsule TAKE 1 CAPSULE BY MOUTH TWICE DAILY  . venlafaxine (EFFEXOR) 37.5 MG tablet TAKE 2 TABLETS BY MOUTH EVERY MORNING AND 1 TABLET EVERY EVENING   No current facility-administered medications on file prior to visit.    Review of Systems Per HPI unless specifically indicated above     Objective:    BP 116/62 mmHg  Pulse 73   Temp(Src) 98.3 F (36.8 C) (Oral)  Ht 5\' 4"  (1.626 m)  Wt 127 lb 1.9 oz (57.661 kg)  BMI 21.81 kg/m2  SpO2 97%  Wt Readings from Last 3 Encounters:  03/03/15 127 lb 1.9 oz (57.661 kg)  02/03/15 128 lb 8 oz (58.287 kg)  08/31/14 128 lb (58.06 kg)    Physical Exam  Constitutional: She appears well-developed and well-nourished. No distress.  Musculoskeletal:  R wrist WNL L wrist - mild swelling at lateral wrist. Tender to palpation at base of 1st MC. No UCL laxity. no pain at anatomical snuff box. Neg finkelstein test. 2+ radial pulse R ankle WNL L ankle - 2+ DP. No ligament laxity, no pain at malleoli, navicular or base of 5th MT. No pain with calcaneal squeeze or at achilles. Mild discomfort at 4th MT mid shaft.  Nursing note and vitals reviewed.      Assessment & Plan:   Problem List Items Addressed This Visit    Left ankle pain - Primary    No evidence of fracture or ligament laxity today.  Anticipate lateral ankle sprain that is healing with time. rec ankle brace, ice, NSAID prn, elevation and rest.      Left wrist pain    Point tender at base of left thumb. Check xray today. If normal anticipate bony contusion and will recommend supportive wrist brace.  Discussed anticipated course of resolution.      Relevant Orders   DG Wrist Complete Left       Follow up plan: Return if symptoms worsen or fail to improve.

## 2015-03-03 NOTE — Patient Instructions (Addendum)
I think you have left ankle sprain that should continue to heal well. Use ankle brace. May use tylenol or ibuprofen as needed. For left wrist - let's check xrays today to rule out fracture. If normal, continue wrist brace as up to now. Let us know if not healing as expected. Nice to meet you today!

## 2015-03-03 NOTE — Assessment & Plan Note (Signed)
No evidence of fracture or ligament laxity today.  Anticipate lateral ankle sprain that is healing with time. rec ankle brace, ice, NSAID prn, elevation and rest.

## 2015-03-03 NOTE — Progress Notes (Signed)
Pre visit review using our clinic review tool, if applicable. No additional management support is needed unless otherwise documented below in the visit note. 

## 2015-03-04 ENCOUNTER — Other Ambulatory Visit: Payer: Self-pay | Admitting: Family Medicine

## 2015-03-15 ENCOUNTER — Other Ambulatory Visit: Payer: Self-pay | Admitting: Obstetrics and Gynecology

## 2015-03-16 LAB — CYTOLOGY - PAP

## 2015-03-27 ENCOUNTER — Other Ambulatory Visit: Payer: Self-pay | Admitting: Family Medicine

## 2015-04-10 ENCOUNTER — Encounter: Payer: Self-pay | Admitting: Gastroenterology

## 2015-04-20 ENCOUNTER — Other Ambulatory Visit: Payer: Self-pay | Admitting: Family Medicine

## 2015-05-21 ENCOUNTER — Other Ambulatory Visit: Payer: Self-pay | Admitting: Family Medicine

## 2015-06-24 ENCOUNTER — Other Ambulatory Visit: Payer: Self-pay | Admitting: Family Medicine

## 2015-06-26 NOTE — Telephone Encounter (Signed)
Lm on pts vm and informed her OV required for additional refills 

## 2015-06-29 ENCOUNTER — Other Ambulatory Visit: Payer: Self-pay | Admitting: Family Medicine

## 2015-07-24 ENCOUNTER — Other Ambulatory Visit: Payer: Self-pay | Admitting: Family Medicine

## 2015-07-24 NOTE — Telephone Encounter (Signed)
Lm on pts vm and informed her OV required for additional refills. #[phone] sent to requested pharmacy

## 2015-08-02 ENCOUNTER — Ambulatory Visit (INDEPENDENT_AMBULATORY_CARE_PROVIDER_SITE_OTHER): Payer: Medicare Other | Admitting: Family Medicine

## 2015-08-02 ENCOUNTER — Encounter: Payer: Self-pay | Admitting: Family Medicine

## 2015-08-02 VITALS — BP 110/62 | HR 60 | Temp 98.1°F | Wt 128.2 lb

## 2015-08-02 DIAGNOSIS — F329 Major depressive disorder, single episode, unspecified: Secondary | ICD-10-CM

## 2015-08-02 DIAGNOSIS — E7800 Pure hypercholesterolemia, unspecified: Secondary | ICD-10-CM | POA: Diagnosis not present

## 2015-08-02 DIAGNOSIS — E559 Vitamin D deficiency, unspecified: Secondary | ICD-10-CM

## 2015-08-02 DIAGNOSIS — F32A Depression, unspecified: Secondary | ICD-10-CM

## 2015-08-02 DIAGNOSIS — I159 Secondary hypertension, unspecified: Secondary | ICD-10-CM

## 2015-08-02 DIAGNOSIS — Z23 Encounter for immunization: Secondary | ICD-10-CM

## 2015-08-02 DIAGNOSIS — K8044 Calculus of bile duct with chronic cholecystitis without obstruction: Secondary | ICD-10-CM

## 2015-08-02 DIAGNOSIS — E538 Deficiency of other specified B group vitamins: Secondary | ICD-10-CM

## 2015-08-02 LAB — COMPREHENSIVE METABOLIC PANEL
ALT: 13 U/L (ref 0–35)
AST: 12 U/L (ref 0–37)
Albumin: 4 g/dL (ref 3.5–5.2)
Alkaline Phosphatase: 74 U/L (ref 39–117)
BILIRUBIN TOTAL: 0.5 mg/dL (ref 0.2–1.2)
BUN: 20 mg/dL (ref 6–23)
CALCIUM: 9.2 mg/dL (ref 8.4–10.5)
CO2: 28 meq/L (ref 19–32)
Chloride: 104 mEq/L (ref 96–112)
Creatinine, Ser: 0.86 mg/dL (ref 0.40–1.20)
GFR: 69.71 mL/min (ref >60.00)
GLUCOSE: 87 mg/dL (ref 70–99)
Potassium: 4.8 mEq/L (ref 3.5–5.1)
Sodium: 138 mEq/L (ref 135–145)
Total Protein: 6.7 g/dL (ref 6.0–8.3)

## 2015-08-02 LAB — VITAMIN B12: Vitamin B-12: 246 pg/mL (ref 211–911)

## 2015-08-02 LAB — LIPID PANEL
CHOL/HDL RATIO: 4
Cholesterol: 227 mg/dL — ABNORMAL HIGH (ref 0–200)
HDL: 58.5 mg/dL (ref >39.00)
LDL Cholesterol: 133 mg/dL — ABNORMAL HIGH (ref 0–99)
NONHDL: 168.86
TRIGLYCERIDES: 179 mg/dL — AB (ref 0.0–149.0)
VLDL: 35.8 mg/dL (ref 0.0–40.0)

## 2015-08-02 LAB — VITAMIN D 25 HYDROXY (VIT D DEFICIENCY, FRACTURES): VITD: 29.73 ng/mL — AB (ref 30.00–100.00)

## 2015-08-02 MED ORDER — CYANOCOBALAMIN 1000 MCG/ML IJ SOLN
1000.0000 ug | Freq: Once | INTRAMUSCULAR | Status: AC
  Administered 2015-08-02: 1000 ug via INTRAMUSCULAR

## 2015-08-02 MED ORDER — PROPRANOLOL HCL 40 MG PO TABS: 40.0000 mg | ORAL_TABLET | Freq: Two times a day (BID) | ORAL | Status: DC

## 2015-08-02 MED ORDER — URSODIOL 300 MG PO CAPS: 300.0000 mg | ORAL_CAPSULE | Freq: Two times a day (BID) | ORAL | Status: DC

## 2015-08-02 MED ORDER — SIMVASTATIN 80 MG PO TABS: 80.0000 mg | ORAL_TABLET | Freq: Every day | ORAL | Status: DC

## 2015-08-02 MED ORDER — VENLAFAXINE HCL 37.5 MG PO TABS: ORAL_TABLET | ORAL | Status: DC

## 2015-08-02 MED ORDER — CYANOCOBALAMIN 1000 MCG/ML IJ SOLN: INTRAMUSCULAR | Status: DC

## 2015-08-02 NOTE — Assessment & Plan Note (Signed)
Check Vit D today as well.

## 2015-08-02 NOTE — Assessment & Plan Note (Signed)
IM B12 rx sent to pharmacy.

## 2015-08-02 NOTE — Assessment & Plan Note (Signed)
Well controlled on current dose of effexor. 

## 2015-08-02 NOTE — Patient Instructions (Signed)
Great to see you.  We will call you with your lab results. 

## 2015-08-02 NOTE — Assessment & Plan Note (Signed)
Well controlled on current rxs. 

## 2015-08-02 NOTE — Progress Notes (Signed)
Pre visit review using our clinic review tool, if applicable. No additional management support is needed unless otherwise documented below in the visit note. 

## 2015-08-02 NOTE — Progress Notes (Signed)
Subjective:    Patient ID: Katie [name], female    DOB: 25-Apr-1946, 69 y.o.   MRN: [id number]  HPI  69 yo pleasant female here for follow up of chronic medical conditions.  Persistent biliary issues- s/p multiple ERCPs, biliary bypass.  Had gastric bypass in 04/2013 due to this.  Feels appetite is great.  Has no complaints today.  Wt Readings from Last 3 Encounters:  08/02/15 128 lb 4 oz (58.174 kg)  03/03/15 127 lb 1.9 oz (57.661 kg)  02/03/15 128 lb 8 oz (58.287 kg)   Depression- feels symptoms well controlled on current dose of effexor.  Excited about the holidays coming up.  B12 deficiency-  Has been receiving B12 injections for years.  Currently receiving injections every 6 weeks.  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  Lab Results  Component Value Date   VITAMINB12 222 08/26/2014    HLD-On Zocor 40 mg daily.  Denies myalgias. Lab Results  Component Value Date   CHOL 240* 08/26/2014   HDL 60.40 08/26/2014   LDLCALC 108* 04/15/2014   LDLDIRECT 144.7 08/26/2014   TRIG 254.0* 08/26/2014   CHOLHDL 4 08/26/2014   Lab Results  Component Value Date   ALT 16 08/26/2014   AST 16 08/26/2014   ALKPHOS 95 08/26/2014   BILITOT 0.7 08/26/2014     Patient Active Problem List   Diagnosis Date Noted  . Medicare annual wellness visit, subsequent 08/24/2014  . H/O gastric bypass 04/17/2014  . Pure hypercholesterolemia 04/17/2014  . Vitamin D deficiency 04/17/2014  . Migraine without aura 01/03/2014  . Visual changes 11/08/2013  . Benign head tremor 11/08/2013  . BP (high blood pressure) 04/26/2013  . Dizziness and giddiness 02/05/2013  . B12 deficiency 04/03/2012  . ABDOMINAL PAIN RIGHT UPPER QUADRANT 04/13/2010  . GERD 05/11/2008  . CHOLEDOCHOLITHIASIS 04/26/2008  . BILE DUCT STRICTURE 04/26/2008  . HYPERLIPIDEMIA, MIXED 03/13/2007  . ANEMIA, PERNICIOUS 03/13/2007  . Depression 02/13/1995   Past Medical  History  Diagnosis Date  . Choledocholithiasis     recurrent  . Bile duct stricture   . Migraine without aura, without mention of intractable migraine without mention of status migrainosus 01/03/2014  . GERD (gastroesophageal reflux disease)    Past Surgical History  Procedure Laterality Date  . Foot surgery      both feet  . Vein ligation and stripping      left leg  . Partial hysterectomy    . Bladder surgery      x 2  . Tubal ligation    . Wrist surgery      right  . Breast lumpectomy      right  . Cholecystectomy    . Explor lap, hepatoduodenostomy  07/23/11    Dr Lazarus Gowda, [facility]  . Gastric bypass  05/04/2013   Social History  Substance Use Topics  . Smoking status: Never Smoker   . Smokeless tobacco: Never Used  . Alcohol Use: No   Family History  Problem Relation Age of Onset  . Cancer Mother     lung, smoker  . Alcohol abuse Father   . GER disease Sister   . GER disease Brother   . Depression Sister   . Anxiety disorder Sister   . Hypertension Sister   . Migraines Neg Hx    No Known Allergies Current Outpatient Prescriptions on File Prior to Visit  Medication Sig Dispense Refill  .  aspirin 81 MG tablet Take 81 mg by mouth daily.    . Biotin 1 MG CAPS Take by mouth.    . cyanocobalamin (,VITAMIN B-12,) 1000 MCG/ML injection ADMINISTER 1 ML IN THE MUSCLE EVERY 6 WEEKS AS DIRECTED 1 mL 0  . Multiple Vitamin (MULTIVITAMIN) tablet Take 1 tablet by mouth daily.    Marland Kitchen NEXIUM 40 MG capsule TAKE 1 CAPSULE BY MOUTH TWICE DAILY 60 capsule 4  . propranolol (INDERAL) 40 MG tablet TAKE 1 TABLET BY MOUTH TWICE DAILY 60 tablet 9  . simvastatin (ZOCOR) 80 MG tablet TAKE 1 TABLET BY MOUTH EVERY NIGHT AT BEDTIME 30 tablet 0  . ursodiol (ACTIGALL) 300 MG capsule TAKE ONE CAPSULE BY MOUTH TWICE DAILY 60 capsule 1  . venlafaxine (EFFEXOR) 37.5 MG tablet TAKE 2 TABLETS BY MOUTH EVERY MORNING AND 1 TABLET EVERY EVENING 90 tablet 6   No current facility-administered medications on  file prior to visit.   The PMH, PSH, Social History, Family History, Medications, and allergies have been reviewed in [facility], and have been updated if relevant.    Review of Systems  Constitutional: Negative.   HENT: Negative.   Eyes: Negative.   Respiratory: Negative.   Cardiovascular: Negative.   Gastrointestinal: Negative.   Endocrine: Negative.   Genitourinary: Negative.   Musculoskeletal: Negative.   Skin: Negative.   Allergic/Immunologic: Negative.   Neurological: Negative.   Hematological: Negative.   Psychiatric/Behavioral: Negative.   All other systems reviewed and are negative.   Wt Readings from Last 3 Encounters:  08/02/15 128 lb 4 oz (58.174 kg)  03/03/15 127 lb 1.9 oz (57.661 kg)  02/03/15 128 lb 8 oz (58.287 kg)    Objective:   Physical Exam BP 110/62 mmHg  Pulse 60  Temp(Src) 98.1 F (36.7 C) (Oral)  Wt 128 lb 4 oz (58.174 kg)  SpO2 98%  General:  Well-developed,well-nourished,in no acute distress; alert,appropriate and cooperative throughout examination Head:  normocephalic and atraumatic.   Eyes:  vision grossly intact, pupils equal, pupils round, and pupils reactive to light.   Ears:  R ear normal and L ear normal.   Nose:  no external deformity.   Mouth:  good dentition.   Lungs:  Normal respiratory effort, chest expands symmetrically. Lungs are clear to auscultation, no crackles or wheezes. Heart:  Normal rate and regular rhythm. S1 and S2 normal without gallop, murmur, click, rub or other extra sounds. Abdomen:  Bowel sounds positive,abdomen soft and non-tender without masses, organomegaly or hernias noted. Msk:  No deformity or scoliosis noted of thoracic or lumbar spine.   Extremities:  No clubbing, cyanosis, edema, or deformity noted with normal full range of motion of all joints.   Neurologic:  alert & oriented X3 and gait normal. Normal strength in all four extremities   Skin:  Intact without suspicious lesions or rashes Psych:  Cognition  and judgment appear intact. Alert and cooperative with normal attention span and concentration. No apparent delusions, illusions, hallucinations    Assessment & Plan:

## 2015-08-02 NOTE — Assessment & Plan Note (Signed)
Labs today. No changes to rxs today.

## 2015-08-03 ENCOUNTER — Encounter: Payer: Self-pay | Admitting: Family Medicine

## 2015-08-12 ENCOUNTER — Other Ambulatory Visit: Payer: Self-pay | Admitting: Family Medicine

## 2015-08-13 ENCOUNTER — Telehealth: Payer: Self-pay | Admitting: Neurology

## 2015-08-13 NOTE — Telephone Encounter (Signed)
I called the patient. MRI of the brain done 2 years ago showed WM changes, I will get a repeat MRI if the patient is amenable to this. She is to call our office.

## 2015-08-23 ENCOUNTER — Telehealth: Payer: Self-pay | Admitting: Family Medicine

## 2015-08-23 NOTE — Telephone Encounter (Signed)
Spoke to pt and informed her immunization records have been faxed as requested

## 2015-08-23 NOTE — Telephone Encounter (Signed)
Patient called and said [facility] is requesting proof of her flu shot.  Patient is asking it be faxed to [phone]:  HR Director.

## 2015-08-27 ENCOUNTER — Other Ambulatory Visit: Payer: Self-pay | Admitting: Family Medicine

## 2015-08-28 ENCOUNTER — Other Ambulatory Visit: Payer: Self-pay | Admitting: Family Medicine

## 2015-08-28 MED ORDER — PROPRANOLOL HCL 40 MG PO TABS: 40.0000 mg | ORAL_TABLET | Freq: Two times a day (BID) | ORAL | Status: DC

## 2015-08-28 MED ORDER — SIMVASTATIN 80 MG PO TABS: 80.0000 mg | ORAL_TABLET | Freq: Every day | ORAL | Status: DC

## 2015-08-28 NOTE — Addendum Note (Signed)
Addended by: Modena Nunnery on: 08/28/2015 10:26 AM   Modules accepted: Orders

## 2015-09-30 ENCOUNTER — Other Ambulatory Visit: Payer: Self-pay | Admitting: Family Medicine

## 2015-10-31 ENCOUNTER — Other Ambulatory Visit: Payer: Self-pay | Admitting: Family Medicine

## 2015-11-30 ENCOUNTER — Other Ambulatory Visit: Payer: Self-pay | Admitting: Family Medicine

## 2015-11-30 ENCOUNTER — Other Ambulatory Visit: Payer: Self-pay

## 2015-11-30 DIAGNOSIS — Z1231 Encounter for screening mammogram for malignant neoplasm of breast: Secondary | ICD-10-CM

## 2015-12-08 ENCOUNTER — Telehealth: Payer: Self-pay

## 2015-12-08 NOTE — Telephone Encounter (Signed)
PLEASE NOTE: All timestamps contained within this report are represented as [place] Standard Time. CONFIDENTIALTY NOTICE: This fax transmission is intended only for the addressee. It contains information that is legally privileged, confidential or otherwise protected from use or disclosure. If you are not the intended recipient, you are strictly prohibited from reviewing, disclosing, copying using or disseminating any of this information or taking any action in reliance on or regarding this information. If you have received this fax in error, please notify us immediately by telephone so that we can arrange for its return to [place]. Phone: [phone], Toll-Free: [phone], Fax: [phone] Page: 1 of 1 Call Id: [id number] [place] [facility] - Client Nonclinical Telephone Record [place] [facility] - Client Client Site [place] - Day Physician [place], [place] Type Call Who Is Calling Patient / Member / Family / Caregiver Caller Name Katie [name] Phone Number [phone] Patient Name Katie [name] Call Type Message Only Information Provided Reason for Call Request for General Office Information Initial Comment Caller wants Dr Marjory Lies to order lab work. Had an episode 2 weeks ago when her blood sugar dropped and saw lights flashing and got very weak. She is fine now Additional Comment Advised to cb a little later if she does not hear back before 2:30 pm Call Closed By: Norton Blizzard Transaction Date/Time: 12/08/2015 2:26:18 PM (ET)

## 2015-12-08 NOTE — Telephone Encounter (Signed)
I called pt; pt last seen 08/02/15. 2 weeks ago pt thought BS dropped; pt is not diabetic but has diabetes on both sides of her family. Pt felt weak and saw flashing lights for short period; no problems since 2 weeks ago. Pt scheduled appt to see Dr Deborra Medina on 01/12/16 at 9 AM (pt will be fasting for lab testing). If pt condition changes or worsens prior to appt pt will go to UC or call [facility].

## 2015-12-12 ENCOUNTER — Ambulatory Visit: Payer: Medicare Other | Admitting: Family Medicine

## 2015-12-12 ENCOUNTER — Other Ambulatory Visit: Payer: Self-pay | Admitting: Family Medicine

## 2015-12-12 ENCOUNTER — Other Ambulatory Visit (INDEPENDENT_AMBULATORY_CARE_PROVIDER_SITE_OTHER): Payer: Medicare Other

## 2015-12-12 DIAGNOSIS — K831 Obstruction of bile duct: Secondary | ICD-10-CM

## 2015-12-12 LAB — COMPREHENSIVE METABOLIC PANEL
ALT: 12 U/L (ref 0–35)
AST: 14 U/L (ref 0–37)
Albumin: 4.4 g/dL (ref 3.5–5.2)
Alkaline Phosphatase: 73 U/L (ref 39–117)
BUN: 14 mg/dL (ref 6–23)
CALCIUM: 9.3 mg/dL (ref 8.4–10.5)
CHLORIDE: 104 meq/L (ref 96–112)
CO2: 30 meq/L (ref 19–32)
Creatinine, Ser: 0.73 mg/dL (ref 0.40–1.20)
GFR: 84.14 mL/min (ref >60.00)
GLUCOSE: 99 mg/dL (ref 70–99)
POTASSIUM: 4.1 meq/L (ref 3.5–5.1)
Sodium: 139 mEq/L (ref 135–145)
Total Bilirubin: 0.5 mg/dL (ref 0.2–1.2)
Total Protein: 7.1 g/dL (ref 6.0–8.3)

## 2015-12-12 LAB — HEMOGLOBIN A1C: Hgb A1c MFr Bld: 5.5 % (ref 4.6–6.5)

## 2015-12-22 ENCOUNTER — Ambulatory Visit
Admission: RE | Admit: 2015-12-22 | Discharge: 2015-12-22 | Disposition: A | Discharge status: Home / Self Care | Payer: Medicare Other | Source: Ambulatory Visit

## 2015-12-22 DIAGNOSIS — Z1231 Encounter for screening mammogram for malignant neoplasm of breast: Secondary | ICD-10-CM

## 2015-12-29 ENCOUNTER — Other Ambulatory Visit: Payer: Self-pay | Admitting: Family Medicine

## 2016-02-05 ENCOUNTER — Ambulatory Visit: Payer: Medicare Other | Admitting: Nurse Practitioner

## 2016-02-15 ENCOUNTER — Ambulatory Visit: Payer: Medicare Other | Admitting: Nurse Practitioner

## 2016-02-18 ENCOUNTER — Other Ambulatory Visit: Payer: Self-pay | Admitting: Family Medicine

## 2016-02-20 ENCOUNTER — Encounter: Payer: Self-pay | Admitting: Nurse Practitioner

## 2016-02-20 ENCOUNTER — Ambulatory Visit (INDEPENDENT_AMBULATORY_CARE_PROVIDER_SITE_OTHER): Payer: Medicare Other | Admitting: Nurse Practitioner

## 2016-02-20 VITALS — BP 120/56 | HR 70 | Resp 16 | Ht 64.0 in | Wt 127.0 lb

## 2016-02-20 DIAGNOSIS — G43009 Migraine without aura, not intractable, without status migrainosus: Secondary | ICD-10-CM

## 2016-02-20 DIAGNOSIS — Z5181 Encounter for therapeutic drug level monitoring: Secondary | ICD-10-CM | POA: Diagnosis not present

## 2016-02-20 DIAGNOSIS — G250 Essential tremor: Secondary | ICD-10-CM

## 2016-02-20 DIAGNOSIS — R930 Abnormal findings on diagnostic imaging of skull and head, not elsewhere classified: Secondary | ICD-10-CM

## 2016-02-20 NOTE — Progress Notes (Signed)
I have read the note, and I agree with the clinical assessment and plan.  Chryl Holten KEITH   

## 2016-02-20 NOTE — Progress Notes (Signed)
[facility]  PATIENT: Katie [name] DOB: [birth year]   REASON FOR VISIT: Follow-up for migraine, benign head tremor, abnormal MRI of the brain in the past HISTORY FROM: Patient    HISTORY OF PRESENT ILLNESS:Katie [name] is a 70 year old right-handed white female with a history of headaches that come and go  she indicates that the headaches are quite brief in nature lasting 5 minutes or so, and the patient is not on medication for the headache.Patient states that her tremors seem to be better and reports that she is "not really" having any migraines.  The patient has developed a head and neck tremor that came on about a year and a half ago it is a side-to-side head tremor which is intermittent. The patient had a maternal aunt with a similar tremor. The patient indicates that no one in her immediate family however, has a tremor. The patient denies any tremor affecting the arms, and she is already on Inderal taking 40 mg twice daily. The patient had a gastric bypass surgery, and she has done well following this. Abnormal MRI of the brain showing typical ischemic changes in the brainstem, and some deep white matter hemispheric changes as well. MRI done in November 2014 showed a new small punctate frontal lesion that was not present on a scan done 2 years prior.  She is to have repeat MRI in 2017. She returns for reevaluation   REVIEW OF SYSTEMS: Full 14 system review of systems performed and notable only for those listed, all others are neg:  Constitutional: neg  Cardiovascular: neg Ear/Nose/Throat: Deaf in the left ear  Skin: neg Eyes: neg Respiratory: neg Gastroitestinal: neg  Hematology/Lymphatic: Borderline anemia Endocrine: neg Musculoskeletal:neg Allergy/Immunology: neg Neurological: Tremors Psychiatric: Anxiety Sleep : neg   ALLERGIES: No Known Allergies  HOME MEDICATIONS: Outpatient Prescriptions Prior to Visit  Medication Sig Dispense Refill  . aspirin  81 MG tablet Take 81 mg by mouth daily.    . Biotin 1 MG CAPS Take by mouth.    . cyanocobalamin (,VITAMIN B-12,) 1000 MCG/ML injection ADMINISTER 1 ML IN THE MUSCLE EVERY 6 WEEKS AS DIRECTED 1 mL 6  . Multiple Vitamin (MULTIVITAMIN) tablet Take 1 tablet by mouth daily.    Marland Kitchen NEXIUM 40 MG capsule TAKE 1 CAPSULE BY MOUTH TWICE DAILY 60 capsule 5  . propranolol (INDERAL) 40 MG tablet Take 1 tablet (40 mg total) by mouth 2 (two) times daily. 60 tablet 11  . simvastatin (ZOCOR) 80 MG tablet Take 1 tablet (80 mg total) by mouth at bedtime. 90 tablet 3  . ursodiol (ACTIGALL) 300 MG capsule Take 1 capsule (300 mg total) by mouth 2 (two) times daily. 60 capsule 6  . venlafaxine (EFFEXOR) 37.5 MG tablet TAKE 2 TABLETS BY MOUTH EVERY MORNING, AND 1 TABLET EVERY EVENING 90 tablet 2   No facility-administered medications prior to visit.    PAST MEDICAL HISTORY: Past Medical History  Diagnosis Date  . Choledocholithiasis     recurrent  . Bile duct stricture   . Migraine without aura, without mention of intractable migraine without mention of status migrainosus 01/03/2014  . GERD (gastroesophageal reflux disease)     PAST SURGICAL HISTORY: Past Surgical History  Procedure Laterality Date  . Foot surgery      both feet  . Vein ligation and stripping      left leg  . Partial hysterectomy    . Bladder surgery      x 2  . Tubal ligation    .  Wrist surgery      right  . Breast lumpectomy      right  . Cholecystectomy    . Explor lap, hepatoduodenostomy  07/23/11    Dr Lazarus Gowda, [facility]  . Gastric bypass  05/04/2013    FAMILY HISTORY: Family History  Problem Relation Age of Onset  . Cancer Mother     lung, smoker  . Alcohol abuse Father   . GER disease Sister   . GER disease Brother   . Depression Sister   . Anxiety disorder Sister   . Hypertension Sister   . Migraines Neg Hx     SOCIAL HISTORY: Social History   Social History  . Marital Status: Married    Spouse Name: N/A  . Number  of Children: 2  . Years of Education: college   Occupational History  . retired   .     Social History Main Topics  . Smoking status: Never Smoker   . Smokeless tobacco: Never Used  . Alcohol Use: No  . Drug Use: No  . Sexual Activity: Not on file   Other Topics Concern  . Not on file   Social History Narrative   Does not have a living will or HPOA.   Desires CPR.   Does not want prolonged life support.     PHYSICAL EXAM  Filed Vitals:   02/20/16 1627  BP: 120/56  Pulse: 70  Resp: 16  Height: 5\' 4"  (1.626 m)  Weight: 127 lb (57.607 kg)   Body mass index is 21.79 kg/(m^2). General: The patient is alert and cooperative at the time of the examination. Skin: No significant peripheral edema is noted. Neurologic Exam Mental status: The patient is oriented x 3. Cranial nerves: Facial symmetry is present. Speech is normal, no aphasia or dysarthria is noted. Extraocular movements are full. Visual fields are full. A side-to-side head and neck tremor is noted which is intermittent and very mild. Motor: The patient has good strength in all 4 extremities. No focal weakness Sensory examination: Soft touch sensation is symmetric on the face, arms, and legs. Coordination: The patient has good finger-nose-finger and heel-to-shin bilaterally. Gait and station: The patient has a normal gait. Tandem gait is normal. Romberg is negative. No drift is seen. Reflexes: Deep tendon reflexes are symmetric.  DIAGNOSTIC DATA (LABS, IMAGING, TESTING) -    Component Value Date/Time   NA 139 12/12/2015 0918   K 4.1 12/12/2015 0918   CL 104 12/12/2015 0918   CO2 30 12/12/2015 0918   GLUCOSE 99 12/12/2015 0918   BUN 14 12/12/2015 0918   CREATININE 0.73 12/12/2015 0918   CREATININE 0.81 04/15/2014 1320   CALCIUM 9.3 12/12/2015 0918   PROT 7.1 12/12/2015 0918   ALBUMIN 4.4 12/12/2015 0918   AST 14 12/12/2015 0918   ALT 12 12/12/2015 0918   ALKPHOS 73 12/12/2015 0918   BILITOT 0.5  12/12/2015 0918   GFRNONAA 75.97 02/21/2010 1002   GFRAA 82 12/16/2007 1121    Lab Results  Component Value Date   HGBA1C 5.5 12/12/2015      ASSESSMENT AND PLAN 70 y.o. year old female has a past medical history of head and neck tremor likely essential tremor, history of headache, abnormal MRI of the brain in the past .   Continue aspirin .81 daily Head tremor is stable Headaches are stable Repeat MRI of the brain  And compare to 2014 Follow-up yearly and when necessary Dennie Bible, [facility], [facility], APRN  [facility]  [place], [address] [place], [place] [address] [phone]

## 2016-02-20 NOTE — Patient Instructions (Signed)
Continue aspirin .81 daily Head tremor is stable Headaches are stable Repeat MRI of the brain  And compare to 2014 Follow-up yearly and when necessary

## 2016-02-21 LAB — BASIC METABOLIC PANEL
BUN / CREAT RATIO: 16 (ref 12–28)
BUN: 12 mg/dL (ref 8–27)
CALCIUM: 8.9 mg/dL (ref 8.7–10.3)
CHLORIDE: 104 mmol/L (ref 96–106)
CO2: 23 mmol/L (ref 18–29)
CREATININE: 0.74 mg/dL (ref 0.57–1.00)
GFR calc Af Amer: 96 mL/min/{1.73_m2} (ref >59)
GFR calc non Af Amer: 84 mL/min/{1.73_m2} (ref >59)
Glucose: 77 mg/dL (ref 65–99)
Potassium: 5.2 mmol/L (ref 3.5–5.2)
Sodium: 143 mmol/L (ref 134–144)

## 2016-02-26 ENCOUNTER — Telehealth: Payer: Self-pay | Admitting: Nurse Practitioner

## 2016-02-26 NOTE — Telephone Encounter (Signed)
-----   Message from Dennie Bible, NP sent at 02/21/2016  8:11 AM EDT ----- Labs good please call the patient

## 2016-02-26 NOTE — Telephone Encounter (Signed)
Called and  Left patient a message relaying labs looked good if any questions please give the office a call back.

## 2016-02-26 NOTE — Telephone Encounter (Signed)
Pt called in regards to MRI scheduling. Please call back

## 2016-02-26 NOTE — Progress Notes (Signed)
Quick Note:  Called and left patient a message relaying labs looked good. Relayed to patient if any questions please give the office a call back. ______

## 2016-02-27 NOTE — Telephone Encounter (Signed)
Returned patients call. Left her a VM letting her know that her MRI was sent to [facility] and giving her their phone number. [phone]. If the patient calls back please let her know. Thanks!

## 2016-03-07 ENCOUNTER — Other Ambulatory Visit: Payer: Medicare Other

## 2016-03-08 ENCOUNTER — Ambulatory Visit
Admission: RE | Admit: 2016-03-08 | Discharge: 2016-03-08 | Disposition: A | Discharge status: Home / Self Care | Payer: Medicare Other | Source: Ambulatory Visit | Attending: Nurse Practitioner | Admitting: Nurse Practitioner

## 2016-03-08 DIAGNOSIS — R930 Abnormal findings on diagnostic imaging of skull and head, not elsewhere classified: Secondary | ICD-10-CM

## 2016-03-11 ENCOUNTER — Telehealth: Payer: Self-pay | Admitting: Neurology

## 2016-03-11 NOTE — Telephone Encounter (Signed)
I called the  patient. MRI of the brain done shows no change from prior study done in November 2014. This appears to show mild to moderate small vessel ischemic changes.   MRI brain 03/11/16:  IMPRESSION: This MRI of the brain without contrast shows the following: 1. Scattered T2/FLAIR hyperintense foci in the pons and in the subcortical, deep periventricular white matter of both hemispheres. None of the foci appears to be acute. This is a nonspecific finding and is most likely due to chronic microvascular ischemic change. Demyelination and vasculitis are less likely. 2. When compared to the MRI of the brain dated 08/09/2013, there does not appear to be any interval change.

## 2016-03-12 ENCOUNTER — Telehealth: Payer: Self-pay | Admitting: *Deleted

## 2016-03-12 NOTE — Telephone Encounter (Signed)
I spoke to pt and relayed her MRI results no changes compared to previous MRI.  She verbalized understanding.

## 2016-03-12 NOTE — Telephone Encounter (Signed)
-----   Message from Ward Givens, NP sent at 03/12/2016  7:41 AM EDT ----- No change compared to previous MRI. Please call patient.

## 2016-03-12 NOTE — Telephone Encounter (Signed)
I called and LMVM for pt that calling with test results.  At her convenience, she will call me back.

## 2016-03-12 NOTE — Telephone Encounter (Signed)
Patient is returning your call about her test results.  Thanks!

## 2016-03-16 ENCOUNTER — Other Ambulatory Visit: Payer: Self-pay | Admitting: Family Medicine

## 2016-04-12 ENCOUNTER — Other Ambulatory Visit: Payer: Self-pay | Admitting: Family Medicine

## 2016-04-12 DIAGNOSIS — E782 Mixed hyperlipidemia: Secondary | ICD-10-CM

## 2016-04-12 DIAGNOSIS — E538 Deficiency of other specified B group vitamins: Secondary | ICD-10-CM

## 2016-04-12 DIAGNOSIS — Z01419 Encounter for gynecological examination (general) (routine) without abnormal findings: Secondary | ICD-10-CM

## 2016-04-12 DIAGNOSIS — E559 Vitamin D deficiency, unspecified: Secondary | ICD-10-CM

## 2016-04-17 ENCOUNTER — Other Ambulatory Visit: Payer: Self-pay | Admitting: Family Medicine

## 2016-04-18 ENCOUNTER — Telehealth: Payer: Self-pay

## 2016-04-18 DIAGNOSIS — Z1159 Encounter for screening for other viral diseases: Secondary | ICD-10-CM

## 2016-04-18 NOTE — Telephone Encounter (Signed)
Hep C screening.

## 2016-04-19 ENCOUNTER — Ambulatory Visit (INDEPENDENT_AMBULATORY_CARE_PROVIDER_SITE_OTHER): Payer: Medicare Other

## 2016-04-19 ENCOUNTER — Other Ambulatory Visit (INDEPENDENT_AMBULATORY_CARE_PROVIDER_SITE_OTHER): Payer: Medicare Other

## 2016-04-19 VITALS — BP 110/68 | Temp 97.9°F | Ht 63.25 in | Wt 125.5 lb

## 2016-04-19 DIAGNOSIS — E538 Deficiency of other specified B group vitamins: Secondary | ICD-10-CM | POA: Diagnosis not present

## 2016-04-19 DIAGNOSIS — Z0000 Encounter for general adult medical examination without abnormal findings: Secondary | ICD-10-CM

## 2016-04-19 DIAGNOSIS — E782 Mixed hyperlipidemia: Secondary | ICD-10-CM | POA: Diagnosis not present

## 2016-04-19 DIAGNOSIS — Z23 Encounter for immunization: Secondary | ICD-10-CM | POA: Diagnosis not present

## 2016-04-19 DIAGNOSIS — E559 Vitamin D deficiency, unspecified: Secondary | ICD-10-CM

## 2016-04-19 DIAGNOSIS — Z1159 Encounter for screening for other viral diseases: Secondary | ICD-10-CM

## 2016-04-19 DIAGNOSIS — Z01419 Encounter for gynecological examination (general) (routine) without abnormal findings: Secondary | ICD-10-CM

## 2016-04-19 LAB — CBC WITH DIFFERENTIAL/PLATELET
BASOS PCT: 0.4 % (ref 0.0–3.0)
Basophils Absolute: 0 10*3/uL (ref 0.0–0.1)
EOS ABS: 0.1 10*3/uL (ref 0.0–0.7)
EOS PCT: 3 % (ref 0.0–5.0)
HCT: 37.9 % (ref 36.0–46.0)
Hemoglobin: 12.9 g/dL (ref 12.0–15.0)
LYMPHS ABS: 1 10*3/uL (ref 0.7–4.0)
Lymphocytes Relative: 21.6 % (ref 12.0–46.0)
MCHC: 34.2 g/dL (ref 30.0–36.0)
MCV: 91.9 fl (ref 78.0–100.0)
MONO ABS: 0.4 10*3/uL (ref 0.1–1.0)
Monocytes Relative: 8 % (ref 3.0–12.0)
NEUTROS ABS: 3 10*3/uL (ref 1.4–7.7)
NEUTROS PCT: 67 % (ref 43.0–77.0)
PLATELETS: 246 10*3/uL (ref 150.0–400.0)
RBC: 4.12 Mil/uL (ref 3.87–5.11)
RDW: 12.4 % (ref 11.5–15.5)
WBC: 4.5 10*3/uL (ref 4.0–10.5)

## 2016-04-19 LAB — COMPREHENSIVE METABOLIC PANEL
ALK PHOS: 73 U/L (ref 39–117)
ALT: 13 U/L (ref 0–35)
AST: 13 U/L (ref 0–37)
Albumin: 4.1 g/dL (ref 3.5–5.2)
BILIRUBIN TOTAL: 0.6 mg/dL (ref 0.2–1.2)
BUN: 15 mg/dL (ref 6–23)
CO2: 29 mEq/L (ref 19–32)
Calcium: 9.7 mg/dL (ref 8.4–10.5)
Chloride: 104 mEq/L (ref 96–112)
Creatinine, Ser: 0.81 mg/dL (ref 0.40–1.20)
GFR: 74.54 mL/min (ref >60.00)
GLUCOSE: 108 mg/dL — AB (ref 70–99)
POTASSIUM: 5.1 meq/L (ref 3.5–5.1)
SODIUM: 139 meq/L (ref 135–145)
TOTAL PROTEIN: 7.1 g/dL (ref 6.0–8.3)

## 2016-04-19 LAB — LIPID PANEL
Cholesterol: 227 mg/dL — ABNORMAL HIGH (ref 0–200)
HDL: 70.3 mg/dL (ref >39.00)
LDL CALC: 132 mg/dL — AB (ref 0–99)
NonHDL: 156.79
Total CHOL/HDL Ratio: 3
Triglycerides: 126 mg/dL (ref 0.0–149.0)
VLDL: 25.2 mg/dL (ref 0.0–40.0)

## 2016-04-19 LAB — VITAMIN B12: VITAMIN B 12: 264 pg/mL (ref 211–911)

## 2016-04-19 LAB — TSH: TSH: 2.54 u[IU]/mL (ref 0.35–4.50)

## 2016-04-19 LAB — VITAMIN D 25 HYDROXY (VIT D DEFICIENCY, FRACTURES): VITD: 35.41 ng/mL (ref 30.00–100.00)

## 2016-04-19 NOTE — Progress Notes (Signed)
I reviewed health advisor's note, was available for consultation, and agree with documentation and plan.  Amy Bedsole, MD [facility]  

## 2016-04-19 NOTE — Progress Notes (Signed)
PCP notes:   Health maintenance:  Hep C screening - completed PPSV23 - administered Flu Vaccine - addressed  Abnormal screenings:   None  Patient concerns:   None  Nurse concerns:  None  Next PCP appt:   04/23/16 @ 0830

## 2016-04-19 NOTE — Progress Notes (Signed)
Pre visit review using our clinic review tool, if applicable. No additional management support is needed unless otherwise documented below in the visit note. 

## 2016-04-19 NOTE — Patient Instructions (Signed)
Ms. Hilder , Thank you for taking time to come for your Medicare Wellness Visit. I appreciate your ongoing commitment to your health goals. Please review the following plan we discussed and let me know if I can assist you in the future.   These are the goals we discussed: Goals    . Increase physical activity          Starting 04/19/2016, I will continue to exercise at least 30 min twice weekly.        This is a list of the screening recommended for you and due dates:  Health Maintenance  Topic Date Due  . Flu Shot  09/15/2016*  . Mammogram  12/21/2017  . Tetanus Vaccine  12/09/2018  . Colon Cancer Screening  07/18/2023  . DEXA scan (bone density measurement)  Addressed  . Shingles Vaccine  Completed  .  Hepatitis C: One time screening is recommended by Center for Disease Control  (CDC) for  adults born from [birth year] through 1964.   Completed  . Pneumonia vaccines  Completed  *Topic was postponed. The date shown is not the original due date.   Preventive Care for Adults  A healthy lifestyle and preventive care can promote health and wellness. Preventive health guidelines for adults include the following key practices.  . A routine yearly physical is a good way to check with your health care provider about your health and preventive screening. It is a chance to share any concerns and updates on your health and to receive a thorough exam.  . Visit your dentist for a routine exam and preventive care every 6 months. Brush your teeth twice a day and floss once a day. Good oral hygiene prevents tooth decay and gum disease.  . The frequency of eye exams is based on your age, health, family medical history, use  of contact lenses, and other factors. Follow your health care provider's ecommendations for frequency of eye exams.  . Eat a healthy diet. Foods like vegetables, fruits, whole grains, low-fat dairy products, and lean protein foods contain the nutrients you need without too many  calories. Decrease your intake of foods high in solid fats, added sugars, and salt. Eat the right amount of calories for you. Get information about a proper diet from your health care provider, if necessary.  . Regular physical exercise is one of the most important things you can do for your health. Most adults should get at least 150 minutes of moderate-intensity exercise (any activity that increases your heart rate and causes you to sweat) each week. In addition, most adults need muscle-strengthening exercises on 2 or more days a week.  Silver Sneakers may be a benefit available to you. To determine eligibility, you may visit the website: www.silversneakers.com or contact program at 949-288-3937 Mon-Fri between 8AM-8PM.   . Maintain a healthy weight. The body mass index (BMI) is a screening tool to identify possible weight problems. It provides an estimate of body fat based on height and weight. Your health care provider can find your BMI and can help you achieve or maintain a healthy weight.   For adults 20 years and older: ? A BMI below 18.5 is considered underweight. ? A BMI of 18.5 to 24.9 is normal. ? A BMI of 25 to 29.9 is considered overweight. ? A BMI of 30 and above is considered obese.   . Maintain normal blood lipids and cholesterol levels by exercising and minimizing your intake of saturated fat. Eat a balanced diet  with plenty of fruit and vegetables. Blood tests for lipids and cholesterol should begin at age 48 and be repeated every 5 years. If your lipid or cholesterol levels are high, you are over 50, or you are at high risk for heart disease, you may need your cholesterol levels checked more frequently. Ongoing high lipid and cholesterol levels should be treated with medicines if diet and exercise are not working.  . If you smoke, find out from your health care provider how to quit. If you do not use tobacco, please do not start.  . If you choose to drink alcohol, please do  not consume more than 2 drinks per day. One drink is considered to be 12 ounces (355 mL) of beer, 5 ounces (148 mL) of wine, or 1.5 ounces (44 mL) of liquor.  . If you are 21-59 years old, ask your health care provider if you should take aspirin to prevent strokes.  . Use sunscreen. Apply sunscreen liberally and repeatedly throughout the day. You should seek shade when your shadow is shorter than you. Protect yourself by wearing long sleeves, pants, a wide-brimmed hat, and sunglasses year round, whenever you are outdoors.  . Once a month, do a whole body skin exam, using a mirror to look at the skin on your back. Tell your health care provider of new moles, moles that have irregular borders, moles that are larger than a pencil eraser, or moles that have changed in shape or color.

## 2016-04-19 NOTE — Progress Notes (Signed)
Subjective:   Katie [name] is a 70 y.o. female who presents for Medicare Annual (Subsequent) preventive examination.  Review of Systems:  N/A Cardiac Risk Factors include: advanced age (>83men, >52 women);hypertension;dyslipidemia     Objective:     Vitals: BP 110/68 (BP Location: Left Arm, Patient Position: Sitting, Cuff Size: Normal)   Temp 97.9 F (36.6 C) (Oral)   Ht 5' 3.25" (1.607 m) Comment: no shoes  Wt 125 lb 8 oz (56.9 kg)   SpO2 98%   BMI 22.06 kg/m   Body mass index is 22.06 kg/m.   Tobacco History  Smoking Status  . Never Smoker  Smokeless Tobacco  . Never Used     Counseling given: No   Past Medical History:  Diagnosis Date  . Bile duct stricture   . Choledocholithiasis    recurrent  . GERD (gastroesophageal reflux disease)   . Migraine without aura, without mention of intractable migraine without mention of status migrainosus 01/03/2014   Past Surgical History:  Procedure Laterality Date  . BLADDER SURGERY     x 2  . BREAST LUMPECTOMY     right  . CHOLECYSTECTOMY    . Explor Lap, Hepatoduodenostomy  07/23/11   Dr Lazarus Gowda, [facility]  . FOOT SURGERY     both feet  . GASTRIC BYPASS  05/04/2013  . PARTIAL HYSTERECTOMY    . TUBAL LIGATION    . VEIN LIGATION AND STRIPPING     left leg  . WRIST SURGERY     right   Family History  Problem Relation Age of Onset  . Cancer Mother     lung, smoker  . Alcohol abuse Father   . GER disease Sister   . GER disease Brother   . Depression Sister   . Anxiety disorder Sister   . Hypertension Sister   . Migraines Neg Hx    History  Sexual Activity  . Sexual activity: Yes    Outpatient Encounter Prescriptions as of 04/19/2016  Medication Sig  . aspirin 81 MG tablet Take 81 mg by mouth daily.  . Biotin 1 MG CAPS Take by mouth.  . cyanocobalamin (,VITAMIN B-12,) 1000 MCG/ML injection ADMINISTER 1 ML IN THE MUSCLE EVERY 6 WEEKS AS DIRECTED  . Multiple Vitamin (MULTIVITAMIN) tablet Take 1 tablet by mouth  daily.  Marland Kitchen NEXIUM 40 MG capsule TAKE 1 CAPSULE BY MOUTH TWICE DAILY  . Omega-3 Fatty Acids (FISH OIL PO) Take by mouth.  . propranolol (INDERAL) 40 MG tablet Take 1 tablet (40 mg total) by mouth 2 (two) times daily.  . simvastatin (ZOCOR) 80 MG tablet Take 1 tablet (80 mg total) by mouth at bedtime.  . ursodiol (ACTIGALL) 300 MG capsule TAKE 1 CAPSULE(300 MG) BY MOUTH TWICE DAILY  . venlafaxine (EFFEXOR) 37.5 MG tablet TAKE 2 TABLETS BY MOUTH EVERY MORNING, AND 1 TABLET EVERY EVENING   No facility-administered encounter medications on file as of 04/19/2016.     Activities of Daily Living In your present state of health, do you have any difficulty performing the following activities: 04/19/2016  Hearing? Y  Vision? N  Difficulty concentrating or making decisions? N  Walking or climbing stairs? N  Dressing or bathing? N  Doing errands, shopping? N  Preparing Food and eating ? N  Using the Toilet? N  In the past six months, have you accidently leaked urine? Y  Do you have problems with loss of bowel control? N  Managing your Medications? N  Managing your  Finances? N  Housekeeping or managing your Housekeeping? N  Some recent data might be hidden    Patient Care Team: Lucille Passy, MD as PCP - General (Family Medicine) Kathrynn Ducking, MD as Consulting Physician (Neurology) Rolm Bookbinder, MD as Consulting Physician (Dermatology) Molli Posey, MD as Consulting Physician (Obstetrics and Gynecology) Thelma Comp, OD as Consulting Physician (Optometry)    Assessment:     Hearing Screening   125Hz  250Hz  500Hz  1000Hz  2000Hz  3000Hz  4000Hz  6000Hz  8000Hz   Right ear:   40 40 40  40    Left ear:           Comments: Total hearing loss in left ear   Visual Acuity Screening   Right eye Left eye Both eyes  Without correction: 20/40 20/30 20/50   With correction:       Exercise Activities and Dietary recommendations Current Exercise Habits: Home exercise routine, Type of exercise:  treadmill, Time (Minutes): 30, Frequency (Times/Week): 2, Weekly Exercise (Minutes/Week): 60, Intensity: Moderate, Exercise limited by: None identified  Goals    . Increase physical activity          Starting 04/19/2016, I will continue to exercise at least 30 min twice weekly.       Fall Risk Fall Risk  04/19/2016 08/24/2014 06/17/2013  Falls in the past year? No No No   Depression Screen PHQ 2/9 Scores 04/19/2016 08/24/2014 06/17/2013  PHQ - 2 Score 0 0 0     Cognitive Testing MMSE - Mini Mental State Exam 04/19/2016  Orientation to time 5  Orientation to Place 5  Registration 3  Attention/ Calculation 0  Recall 3  Language- name 2 objects 0  Language- repeat 1  Language- follow 3 step command 3  Language- read & follow direction 0  Write a sentence 0  Copy design 0  Total score 20   PLEASE NOTE: A Mini-Cog screen was completed. Maximum score is 20. A value of 0 denotes this part of Folstein MMSE was not completed or the patient failed this part of the Mini-Cog screening.   Mini-Cog Screening Orientation to Time - Max 5 pts Orientation to Place - Max 5 pts Registration - Max 3 pts Recall - Max 3 pts Language Repeat - Max 1 pts Language Follow 3 Step Command - Max 3 pts   Immunization History  Administered Date(s) Administered  . Influenza,inj,Quad PF,36+ Mos 08/02/2015  . Influenza-Unspecified 06/24/2014  . Pneumococcal Conjugate-13 08/24/2014  . Pneumococcal Polysaccharide-23 04/19/2016  . Td 12/08/2008  . Zoster 07/19/2015   Screening Tests Health Maintenance  Topic Date Due  . INFLUENZA VACCINE  09/15/2016 (Originally 04/16/2016)  . MAMMOGRAM  12/21/2017  . TETANUS/TDAP  12/09/2018  . COLONOSCOPY  07/18/2023  . DEXA SCAN  Addressed  . ZOSTAVAX  Completed  . Hepatitis C Screening  Completed  . PNA vac Low Risk Adult  Completed      Plan:     I have personally reviewed and addressed the Medicare Annual Wellness questionnaire and have noted the following in the  patient's chart:  A. Medical and social history B. Use of alcohol, tobacco or illicit drugs  C. Current medications and supplements D. Functional ability and status E.  Nutritional status F.  Physical activity G. Advance directives H. List of other physicians I.  Hospitalizations, surgeries, and ER visits in previous 12 months J.  [place] to include hearing, vision, cognitive, depression L. Referrals and appointments - none  In addition, I have reviewed  and discussed with patient certain preventive protocols, quality metrics, and best practice recommendations. A written personalized care plan for preventive services as well as general preventive health recommendations were provided to patient.  See attached scanned questionnaire for additional information.   Signed,   Lindell Noe, MHA, BS, LPN Health Advisor

## 2016-04-20 LAB — HEPATITIS C ANTIBODY: HCV Ab: NEGATIVE

## 2016-04-23 ENCOUNTER — Encounter: Payer: Medicare Other | Admitting: Family Medicine

## 2016-05-02 ENCOUNTER — Encounter: Payer: Medicare Other | Admitting: Family Medicine

## 2016-05-19 ENCOUNTER — Other Ambulatory Visit: Payer: Self-pay | Admitting: Family Medicine

## 2016-05-30 ENCOUNTER — Ambulatory Visit (INDEPENDENT_AMBULATORY_CARE_PROVIDER_SITE_OTHER): Payer: Medicare Other | Admitting: Family Medicine

## 2016-05-30 ENCOUNTER — Ambulatory Visit (INDEPENDENT_AMBULATORY_CARE_PROVIDER_SITE_OTHER)
Admission: RE | Admit: 2016-05-30 | Discharge: 2016-05-30 | Disposition: A | Discharge status: Home / Self Care | Payer: Medicare Other | Source: Ambulatory Visit | Attending: Family Medicine | Admitting: Family Medicine

## 2016-05-30 VITALS — BP 116/62 | HR 60 | Temp 98.1°F | Ht 63.0 in | Wt 127.0 lb

## 2016-05-30 DIAGNOSIS — R059 Cough, unspecified: Secondary | ICD-10-CM

## 2016-05-30 DIAGNOSIS — E7800 Pure hypercholesterolemia, unspecified: Secondary | ICD-10-CM | POA: Diagnosis not present

## 2016-05-30 DIAGNOSIS — E538 Deficiency of other specified B group vitamins: Secondary | ICD-10-CM | POA: Diagnosis not present

## 2016-05-30 DIAGNOSIS — F329 Major depressive disorder, single episode, unspecified: Secondary | ICD-10-CM

## 2016-05-30 DIAGNOSIS — R05 Cough: Secondary | ICD-10-CM

## 2016-05-30 DIAGNOSIS — F32A Depression, unspecified: Secondary | ICD-10-CM

## 2016-05-30 DIAGNOSIS — Z0000 Encounter for general adult medical examination without abnormal findings: Secondary | ICD-10-CM

## 2016-05-30 DIAGNOSIS — E782 Mixed hyperlipidemia: Secondary | ICD-10-CM

## 2016-05-30 DIAGNOSIS — Z01419 Encounter for gynecological examination (general) (routine) without abnormal findings: Secondary | ICD-10-CM | POA: Insufficient documentation

## 2016-05-30 NOTE — Progress Notes (Signed)
Pre visit review using our clinic review tool, if applicable. No additional management support is needed unless otherwise documented below in the visit note. 

## 2016-05-30 NOTE — Assessment & Plan Note (Signed)
Reviewed preventive care protocols, scheduled due services, and updated immunizations Discussed nutrition, exercise, diet, and healthy lifestyle.  

## 2016-05-30 NOTE — Progress Notes (Signed)
Subjective:    Patient ID: Katie [name], female    DOB: [birth year], 70 y.o.   MRN: [id number]  HPI  70 yo pleasant female here for CPX and follow up of chronic medical conditions.  Annual medicare wellness visit with Candis Musa on 04/19/16. Notes reviewed.  She has been coughing since she thinks she aspirated bilous vomit earlier this week. No fever. Cough is "wet" but not productive.  Persistent biliary issues- s/p multiple ERCPs, biliary bypass.  Had gastric bypass in 04/2013 due to this.  Feels appetite is great.  Has no complaints today.  Wt Readings from Last 3 Encounters:  05/30/16 127 lb (57.6 kg)  04/19/16 125 lb 8 oz (56.9 kg)  02/20/16 127 lb (57.6 kg)   Depression- feels symptoms well controlled on current dose of effexor.  Excited about the holidays coming up.  B12 deficiency-  Has been receiving B12 injections for years.  Currently receiving injections every 6 weeks.  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  Lab Results  Component Value Date   VITAMINB12 264 04/19/2016    HLD-On Zocor 40 mg daily.  Denies myalgias. Lab Results  Component Value Date   CHOL 227 (H) 04/19/2016   HDL 70.30 04/19/2016   LDLCALC 132 (H) 04/19/2016   LDLDIRECT 144.7 08/26/2014   TRIG 126.0 04/19/2016   CHOLHDL 3 04/19/2016   Lab Results  Component Value Date   ALT 13 04/19/2016   AST 13 04/19/2016   ALKPHOS 73 04/19/2016   BILITOT 0.6 04/19/2016     Patient Active Problem List  Diagnosis  . HYPERLIPIDEMIA, MIXED  . ANEMIA, PERNICIOUS  . GERD  . Calculus of bile duct  . BILE DUCT STRICTURE  . ABDOMINAL PAIN RIGHT UPPER QUADRANT  . Depression  . B12 deficiency  . Dizziness and giddiness  . Visual changes  . Benign head tremor  . Migraine without aura  . H/O gastric bypass  . Pure hypercholesterolemia  . Vitamin D deficiency  . BP (high blood pressure)  . Abnormal MRI of the head  . Well woman exam    Past Medical History:  Diagnosis Date  . Bile duct stricture   . Choledocholithiasis    recurrent  . GERD (gastroesophageal reflux disease)   . Migraine without aura, without mention of intractable migraine without mention of status migrainosus 01/03/2014   Past Surgical History:  Procedure Laterality Date  . BLADDER SURGERY     x 2  . BREAST LUMPECTOMY     right  . CHOLECYSTECTOMY    . Explor Lap, Hepatoduodenostomy  07/23/11   Dr Lazarus Gowda, [facility]  . FOOT SURGERY     both feet  . GASTRIC BYPASS  05/04/2013  . PARTIAL HYSTERECTOMY    . TUBAL LIGATION    . VEIN LIGATION AND STRIPPING     left leg  . WRIST SURGERY     right   Social History  Substance Use Topics  . Smoking status: Never Smoker  . Smokeless tobacco: Never Used  . Alcohol use No   Family History  Problem Relation Age of Onset  . Cancer Mother     lung, smoker  . Alcohol abuse Father   . GER disease Sister   . GER disease Brother   . Depression Sister   . Anxiety disorder Sister   . Hypertension Sister   . Migraines Neg Hx    No Known  Allergies Current Outpatient Prescriptions on File Prior to Visit  Medication Sig Dispense Refill  . aspirin 81 MG tablet Take 81 mg by mouth daily.    . Biotin 1 MG CAPS Take by mouth.    . cyanocobalamin (,VITAMIN B-12,) 1000 MCG/ML injection ADMINISTER 1 ML IN THE MUSCLE EVERY 6 WEEKS AS DIRECTED 1 mL 6  . Multiple Vitamin (MULTIVITAMIN) tablet Take 1 tablet by mouth daily.    Marland Kitchen NEXIUM 40 MG capsule TAKE 1 CAPSULE BY MOUTH TWICE DAILY 60 capsule 5  . Omega-3 Fatty Acids (FISH OIL PO) Take by mouth.    . propranolol (INDERAL) 40 MG tablet Take 1 tablet (40 mg total) by mouth 2 (two) times daily. 60 tablet 11  . simvastatin (ZOCOR) 80 MG tablet Take 1 tablet (80 mg total) by mouth at bedtime. 90 tablet 3  . ursodiol (ACTIGALL) 300 MG capsule TAKE 1 CAPSULE(300 MG) BY MOUTH TWICE DAILY 60 capsule 2  . venlafaxine (EFFEXOR) 37.5 MG tablet TAKE 2 TABLETS BY MOUTH EVERY  MORNING, AND 1 TABLET EVERY EVENING 90 tablet 5   No current facility-administered medications on file prior to visit.    The PMH, PSH, Social History, Family History, Medications, and allergies have been reviewed in [facility], and have been updated if relevant.    Review of Systems  Constitutional: Negative.   HENT: Negative.   Eyes: Negative.   Respiratory: Positive for cough. Negative for wheezing.   Cardiovascular: Negative.   Gastrointestinal: Negative.   Endocrine: Negative.   Genitourinary: Negative.   Musculoskeletal: Negative.   Skin: Negative.   Allergic/Immunologic: Negative.   Neurological: Negative.   Hematological: Negative.   Psychiatric/Behavioral: Negative.   All other systems reviewed and are negative.   Wt Readings from Last 3 Encounters:  05/30/16 127 lb (57.6 kg)  04/19/16 125 lb 8 oz (56.9 kg)  02/20/16 127 lb (57.6 kg)    Objective:   Physical Exam BP 116/62   Pulse 60   Temp 98.1 F (36.7 C) (Oral)   Ht 5\' 3"  (1.6 m)   Wt 127 lb (57.6 kg)   SpO2 99%   BMI 22.50 kg/m   General:  Well-developed,well-nourished,in no acute distress; alert,appropriate and cooperative throughout examination Head:  normocephalic and atraumatic.   Eyes:  vision grossly intact, pupils equal, pupils round, and pupils reactive to light.   Ears:  R ear normal and L ear normal.   Nose:  no external deformity.   Mouth:  good dentition.   Lungs:  Normal respiratory effort, chest expands symmetrically. Lungs are clear to auscultation, no crackles or wheezes. Heart:  Normal rate and regular rhythm. S1 and S2 normal without gallop, murmur, click, rub or other extra sounds. Abdomen:  Bowel sounds positive,abdomen soft and non-tender without masses, organomegaly or hernias noted. Msk:  No deformity or scoliosis noted of thoracic or lumbar spine.   Extremities:  No clubbing, cyanosis, edema, or deformity noted with normal full range of motion of all joints.   Neurologic:  alert &  oriented X3 and gait normal. Normal strength in all four extremities   Skin:  Intact without suspicious lesions or rashes Psych:  Cognition and judgment appear intact. Alert and cooperative with normal attention span and concentration. No apparent delusions, illusions, hallucinations    Assessment & Plan:

## 2016-05-30 NOTE — Assessment & Plan Note (Signed)
Well controlled on current rx. No changes made today. 

## 2016-05-30 NOTE — Assessment & Plan Note (Signed)
New- ? Aspiration. Lungs clear but does complain of wet cough. Xray today.

## 2016-05-30 NOTE — Patient Instructions (Signed)
Great to see you. Happy birthday!!! I will call you with your xray results. 

## 2016-06-17 ENCOUNTER — Other Ambulatory Visit: Payer: Self-pay | Admitting: Family Medicine

## 2016-06-21 ENCOUNTER — Other Ambulatory Visit: Payer: Self-pay | Admitting: Family Medicine

## 2016-07-17 ENCOUNTER — Other Ambulatory Visit: Payer: Self-pay | Admitting: Family Medicine

## 2016-07-19 ENCOUNTER — Other Ambulatory Visit: Payer: Self-pay | Admitting: Family Medicine

## 2016-08-19 ENCOUNTER — Other Ambulatory Visit: Payer: Self-pay | Admitting: Family Medicine

## 2016-08-20 ENCOUNTER — Other Ambulatory Visit: Payer: Self-pay | Admitting: Family Medicine

## 2016-09-18 ENCOUNTER — Other Ambulatory Visit: Payer: Self-pay | Admitting: Family Medicine

## 2016-10-17 ENCOUNTER — Other Ambulatory Visit: Payer: Self-pay | Admitting: Family Medicine

## 2016-11-21 ENCOUNTER — Other Ambulatory Visit: Payer: Self-pay | Admitting: Family Medicine

## 2016-11-27 ENCOUNTER — Other Ambulatory Visit: Payer: Self-pay

## 2016-11-27 MED ORDER — PROPRANOLOL HCL 40 MG PO TABS: ORAL_TABLET | ORAL | 2 refills | Status: DC

## 2016-11-27 MED ORDER — URSODIOL 300 MG PO CAPS: ORAL_CAPSULE | ORAL | 2 refills | Status: DC

## 2016-11-27 MED ORDER — VENLAFAXINE HCL 37.5 MG PO TABS: ORAL_TABLET | ORAL | 2 refills | Status: DC

## 2016-11-27 MED ORDER — SIMVASTATIN 80 MG PO TABS: ORAL_TABLET | ORAL | 2 refills | Status: DC

## 2016-11-27 MED ORDER — ESOMEPRAZOLE MAGNESIUM 40 MG PO CPDR: 40.0000 mg | DELAYED_RELEASE_CAPSULE | Freq: Two times a day (BID) | ORAL | 2 refills | Status: DC

## 2016-11-27 NOTE — Telephone Encounter (Signed)
Received a refill request for her 5 main meds. Verified with pt she wanted to go through [facility]. She said yes. I will send them in.

## 2016-11-28 ENCOUNTER — Other Ambulatory Visit: Payer: Self-pay

## 2016-11-28 MED ORDER — CYANOCOBALAMIN 1000 MCG/ML IJ SOLN: INTRAMUSCULAR | 4 refills | Status: DC

## 2016-11-28 NOTE — Telephone Encounter (Signed)
See rx tab

## 2016-12-31 ENCOUNTER — Telehealth: Payer: Self-pay

## 2016-12-31 NOTE — Telephone Encounter (Signed)
Pt left v/m; Ursodiol 300mg  has gone to a higher tier and too expensive for pt. Pt wants to know if could get ursodiol 250 mg which is still tier 1 and less expensive. [facility] [address]. Pt request cb to see if can try the ursodiol 250 mg. Pt last annual 05/30/16.

## 2017-01-01 MED ORDER — URSODIOL 250 MG PO TABS: 250.0000 mg | ORAL_TABLET | Freq: Three times a day (TID) | ORAL | 3 refills | Status: DC

## 2017-01-01 NOTE — Telephone Encounter (Signed)
Yes eRx sent as requested.

## 2017-02-07 ENCOUNTER — Other Ambulatory Visit: Payer: Self-pay | Admitting: Obstetrics and Gynecology

## 2017-02-07 DIAGNOSIS — Z1231 Encounter for screening mammogram for malignant neoplasm of breast: Secondary | ICD-10-CM

## 2017-02-20 ENCOUNTER — Ambulatory Visit
Admission: RE | Admit: 2017-02-20 | Discharge: 2017-02-20 | Disposition: A | Discharge status: Home / Self Care | Payer: Medicare Other | Source: Ambulatory Visit | Attending: Obstetrics and Gynecology | Admitting: Obstetrics and Gynecology

## 2017-02-20 DIAGNOSIS — Z1231 Encounter for screening mammogram for malignant neoplasm of breast: Secondary | ICD-10-CM

## 2017-02-21 ENCOUNTER — Encounter: Payer: Self-pay | Admitting: Nurse Practitioner

## 2017-02-21 ENCOUNTER — Ambulatory Visit (INDEPENDENT_AMBULATORY_CARE_PROVIDER_SITE_OTHER): Payer: Medicare Other | Admitting: Nurse Practitioner

## 2017-02-21 VITALS — BP 104/60 | HR 54 | Wt 124.8 lb

## 2017-02-21 DIAGNOSIS — G250 Essential tremor: Secondary | ICD-10-CM

## 2017-02-21 DIAGNOSIS — R930 Abnormal findings on diagnostic imaging of skull and head, not elsewhere classified: Secondary | ICD-10-CM | POA: Diagnosis not present

## 2017-02-21 DIAGNOSIS — G43009 Migraine without aura, not intractable, without status migrainosus: Secondary | ICD-10-CM | POA: Diagnosis not present

## 2017-02-21 NOTE — Patient Instructions (Addendum)
Continue aspirin .81 daily Head tremor is stable Headaches are stable  MRI of the brain 03/08/16 stable when compared to 2014 Follow-up yearly and when necessaryEssential Tremor A tremor is trembling or shaking that you cannot control. Most tremors affect the hands or arms. Tremors can also affect the head, vocal cords, face, and other parts of the body. Essential tremor is a tremor without a known cause. What are the causes? Essential tremor has no known cause. What increases the risk? You may be at greater risk of essential tremor if:  You have a family member with essential tremor.  You are age 66 or older.  You take certain medicines.  What are the signs or symptoms? The main sign of a tremor is uncontrolled and unintentional rhythmic shaking of a body part.  You may have difficulty eating with a spoon or fork.  You may have difficulty writing.  You may nod your head up and down or side to side.  You may have a quivering voice.  Your tremors:  May get worse over time.  May come and go.  May be more noticeable on one side of your body.  May get worse due to stress, fatigue, caffeine, and extreme heat or cold.  How is this diagnosed? Your health care provider can diagnose essential tremor based on your symptoms, medical history, and a physical examination. There is no single test to diagnose an essential tremor. However, your health care provider may perform a variety of tests to rule out other conditions. Tests may include:  Blood and urine tests.  Imaging studies of your brain, such as: ? CT scan. ? MRI.  A test that measures involuntary muscle movement (electromyogram).  How is this treated? Your tremors may go away without treatment. Mild tremors may not need treatment if they do not affect your day-to-day life. Severe tremors may need to be treated using one or a combination of the following options:  Medicines. This may include medicine that is  injected.  Lifestyle changes.  Physical therapy.  Follow these instructions at home:  Take medicines only as directed by your health care provider.  Limit alcohol intake to no more than 1 drink per day for nonpregnant women and 2 drinks per day for men. One drink equals 12 oz of beer, 5 oz of wine, or 1 oz of hard liquor.  Do not use any tobacco products, including cigarettes, chewing tobacco, or electronic cigarettes. If you need help quitting, ask your health care provider.  Take medicines only as directed by your health care provider.  Avoid extreme heat or cold.  Limit the amount of caffeine you consumeas directed by your health care provider.  Try to get eight hours of sleep each night.  Find ways to manage your stress, such as meditation or yoga.  Keep all follow-up visits as directed by your health care provider. This is important. This includes any physical therapy visits. Contact a health care provider if:  You experience any changes in the location or intensity of your tremors.  You start having a tremor after starting a new medicine.  You have tremor with other symptoms such as: ? Numbness. ? Tingling. ? Pain. ? Weakness.  Your tremor gets worse.  Your tremor interferes with your daily life. This information is not intended to replace advice given to you by your health care provider. Make sure you discuss any questions you have with your health care provider. Document Released: 09/23/2014 Document Revised: 02/08/2016 Document  Reviewed: 02/28/2014 Elsevier Interactive Patient Education  [employer].

## 2017-02-21 NOTE — Progress Notes (Signed)
[facility]  PATIENT: Katie [name] DOB: 08-Feb-1946   REASON FOR VISIT: Follow-up for migraine, benign head tremor, abnormal MRI of the brain in the past HISTORY FROM: Patient    HISTORY OF PRESENT ILLNESS:Ms. Cookston is a 71 year old right-handed white female with a history of headaches that come and go  she indicates that the headaches are quite brief in nature lasting 5 minutes or so, and the patient is not on medication for the headache.Patient states that her tremors seem to be better and reports that she is "not really" having any migraines.  The patient has developed a head and neck tremor that came on about a 2.5 years ago,  it is a side-to-side head tremor which is intermittent. The patient had a maternal aunt with a similar tremor. The patient indicates that no one in her immediate family however, has a tremor. The patient denies any tremor affecting the arms, and she is already on Inderal taking 40 mg twice daily.  Abnormal MRI of the brain showing typical ischemic changes in the brainstem, and some deep white matter hemispheric changes as well. MRI done in November 2014 showed a new small punctate frontal lesion that was not present on a scan done 2 years prior.  Repeat MRI in 2017 This MRI of the brain without contrast shows the following: 1.    Scattered T2/FLAIR hyperintense foci in the pons and in the subcortical, deep periventricular white matter of both hemispheres.   None of the foci appears to be acute. This is a nonspecific finding and is most likely due to chronic microvascular ischemic change.    Demyelination and vasculitis are less likely. 2.    When compared to the MRI of the brain dated 08/09/2013, there does not appear to be any interval change. She returns for reevaluation  REVIEW OF SYSTEMS: Full 14 system review of systems performed and notable only for those listed, all others are neg:  Constitutional: neg  Cardiovascular: neg Ear/Nose/Throat:  Deaf in the left ear  Skin: neg Eyes: neg Respiratory: neg Gastroitestinal: neg  Hematology/Lymphatic: neg Endocrine: neg Musculoskeletal:neg Allergy/Immunology: neg Neurological: Tremors Psychiatric: Anxiety Sleep : neg   ALLERGIES: No Known Allergies  HOME MEDICATIONS: Outpatient Medications Prior to Visit  Medication Sig Dispense Refill  . aspirin 81 MG tablet Take 81 mg by mouth daily.    . Biotin 1 MG CAPS Take by mouth.    . cyanocobalamin (,VITAMIN B-12,) 1000 MCG/ML injection ADMINISTER 1 ML IN THE MUSCLE EVERY 6 WEEKS AS DIRECTED 2 mL 4  . esomeprazole (NEXIUM) 40 MG capsule Take 1 capsule (40 mg total) by mouth 2 (two) times daily. 180 capsule 2  . Omega-3 Fatty Acids (FISH OIL PO) Take by mouth.    . simvastatin (ZOCOR) 80 MG tablet TAKE 1 TABLET(80 MG) BY MOUTH AT BEDTIME 90 tablet 2  . ursodiol (ACTIGALL) 250 MG tablet Take 1 tablet (250 mg total) by mouth 3 (three) times daily. 90 tablet 3  . venlafaxine (EFFEXOR) 37.5 MG tablet TAKE 2 TABLETS BY MOUTH EVERY MORNING, AND 1 TABLET EVERY EVENING 270 tablet 2  . Multiple Vitamin (MULTIVITAMIN) tablet Take 1 tablet by mouth daily.    . propranolol (INDERAL) 40 MG tablet TAKE 1 TABLET(40 MG) BY MOUTH TWICE DAILY (Patient not taking: Reported on 02/21/2017) 180 tablet 2   No facility-administered medications prior to visit.     PAST MEDICAL HISTORY: Past Medical History:  Diagnosis Date  . Bile duct stricture   .  Choledocholithiasis    recurrent  . GERD (gastroesophageal reflux disease)   . Migraine without aura, without mention of intractable migraine without mention of status migrainosus 01/03/2014    PAST SURGICAL HISTORY: Past Surgical History:  Procedure Laterality Date  . BLADDER SURGERY     x 2  . BREAST EXCISIONAL BIOPSY Right 2003   benign  . BREAST LUMPECTOMY     right  . CHOLECYSTECTOMY    . Explor Lap, Hepatoduodenostomy  07/23/11   Dr Lazarus Gowda, [facility]  . FOOT SURGERY     both feet  . GASTRIC  BYPASS  05/04/2013  . PARTIAL HYSTERECTOMY    . TUBAL LIGATION    . VEIN LIGATION AND STRIPPING     left leg  . WRIST SURGERY     right    FAMILY HISTORY: Family History  Problem Relation Age of Onset  . Cancer Mother        lung, smoker  . Alcohol abuse Father   . GER disease Sister   . GER disease Brother   . Depression Sister   . Anxiety disorder Sister   . Hypertension Sister   . Migraines Neg Hx     SOCIAL HISTORY: Social History   Social History  . Marital status: Married    Spouse name: N/A  . Number of children: 2  . Years of education: college   Occupational History  . retired   .  Retired   Social History Main Topics  . Smoking status: Never Smoker  . Smokeless tobacco: Never Used  . Alcohol use No  . Drug use: No  . Sexual activity: Yes   Other Topics Concern  . Not on file   Social History Narrative   Does not have a living will or HPOA.   Desires CPR.   Does not want prolonged life support.     PHYSICAL EXAM  Vitals:   02/21/17 1036  BP: 104/60  Pulse: (!) 54  Weight: 124 lb 12.8 oz (56.6 kg)   Body mass index is 22.11 kg/m. General: The patient is alert and cooperative at the time of the examination. Skin: No significant peripheral edema is noted. Neurologic Exam Mental status: The patient is oriented x 3. Cranial nerves: Facial symmetry is present. Speech is normal, no aphasia or dysarthria is noted. Extraocular movements are full. Visual fields are full. A side-to-side head and neck tremor is noted which is intermittent and very mild. Motor: The patient has good strength in all 4 extremities. No focal weakness Sensory examination: Soft touch sensation is symmetric on the face, arms, and legs. Coordination: The patient has good finger-nose-finger and heel-to-shin bilaterally. Gait and station: The patient has a normal gait. Tandem gait is normal. Romberg is negative. No drift is seen. Reflexes: Deep tendon reflexes are  symmetric.  DIAGNOSTIC DATA (LABS, IMAGING, TESTING) -    Component Value Date/Time   NA 139 04/19/2016 0934   NA 143 02/20/2016 1659   K 5.1 04/19/2016 0934   CL 104 04/19/2016 0934   CO2 29 04/19/2016 0934   GLUCOSE 108 (H) 04/19/2016 0934   BUN 15 04/19/2016 0934   BUN 12 02/20/2016 1659   CREATININE 0.81 04/19/2016 0934   CREATININE 0.81 04/15/2014 1320   CALCIUM 9.7 04/19/2016 0934   PROT 7.1 04/19/2016 0934   ALBUMIN 4.1 04/19/2016 0934   AST 13 04/19/2016 0934   ALT 13 04/19/2016 0934   ALKPHOS 73 04/19/2016 0934   BILITOT 0.6 04/19/2016 0934  GFRNONAA 84 02/20/2016 1659   GFRAA 96 02/20/2016 1659    Lab Results  Component Value Date   HGBA1C 5.5 12/12/2015    03/08/16 This MRI of the brain without contrast shows the following: 1.    Scattered T2/FLAIR hyperintense foci in the pons and in the subcortical, deep periventricular white matter of both hemispheres.   None of the foci appears to be acute. This is a nonspecific finding and is most likely due to chronic microvascular ischemic change.    Demyelination and vasculitis are less likely. 2.    When compared to the MRI of the brain dated 08/09/2013, there does not appear to be any interval change.   ASSESSMENT AND PLAN 71 y.o. year old female has a past medical history of head and neck tremor likely essential tremor, history of headache, abnormal MRI of the brain in the past .   Continue aspirin .81 daily Head tremor is stable Headaches are stable  MRI of the brain 03/08/16 stable when compared to 2014 Follow-up yearly and when necessary Dennie Bible, [facility], [facility], [facility] [address], [place], [place] [address] [phone]

## 2017-02-21 NOTE — Progress Notes (Signed)
I have read the note, and I agree with the clinical assessment and plan.  Katie [name] KEITH   

## 2017-02-25 ENCOUNTER — Other Ambulatory Visit: Payer: Self-pay | Admitting: Obstetrics and Gynecology

## 2017-02-25 DIAGNOSIS — R928 Other abnormal and inconclusive findings on diagnostic imaging of breast: Secondary | ICD-10-CM

## 2017-02-27 ENCOUNTER — Ambulatory Visit
Admission: RE | Admit: 2017-02-27 | Discharge: 2017-02-27 | Disposition: A | Discharge status: Home / Self Care | Payer: Medicare Other | Source: Ambulatory Visit | Attending: Obstetrics and Gynecology | Admitting: Obstetrics and Gynecology

## 2017-02-27 ENCOUNTER — Other Ambulatory Visit: Payer: Self-pay | Admitting: Obstetrics and Gynecology

## 2017-02-27 DIAGNOSIS — N6489 Other specified disorders of breast: Secondary | ICD-10-CM

## 2017-02-27 DIAGNOSIS — R928 Other abnormal and inconclusive findings on diagnostic imaging of breast: Secondary | ICD-10-CM

## 2017-03-05 ENCOUNTER — Ambulatory Visit
Admission: RE | Admit: 2017-03-05 | Discharge: 2017-03-05 | Disposition: A | Discharge status: Home / Self Care | Payer: Medicare Other | Source: Ambulatory Visit | Attending: Obstetrics and Gynecology | Admitting: Obstetrics and Gynecology

## 2017-03-05 DIAGNOSIS — N6489 Other specified disorders of breast: Secondary | ICD-10-CM

## 2017-03-20 ENCOUNTER — Other Ambulatory Visit: Payer: Self-pay | Admitting: Family Medicine

## 2017-03-25 ENCOUNTER — Other Ambulatory Visit: Payer: Self-pay | Admitting: Surgery

## 2017-03-25 DIAGNOSIS — N6022 Fibroadenosis of left breast: Secondary | ICD-10-CM

## 2017-03-28 ENCOUNTER — Encounter (HOSPITAL_COMMUNITY): Payer: Self-pay | Admitting: *Deleted

## 2017-03-28 ENCOUNTER — Ambulatory Visit
Admission: RE | Admit: 2017-03-28 | Discharge: 2017-03-28 | Disposition: A | Discharge status: Home / Self Care | Payer: Medicare Other | Source: Ambulatory Visit | Attending: Surgery | Admitting: Surgery

## 2017-03-28 DIAGNOSIS — N6022 Fibroadenosis of left breast: Secondary | ICD-10-CM

## 2017-03-28 NOTE — Progress Notes (Signed)
Pt denies SOB, chest pain, and being under the care of a cardiologist. Pt denies having a cardiac cath but stated that a stress test was performed > 10 years ago. Pt made aware to stop taking  Aspirin, vitamins, fish oil, Biotin and herbal medications. Do not take any NSAIDs ie: Ibuprofen, Advil, Naproxen (aleve), Motrin, BC and Goody Powder or any medication containing Aspirin. Pt verbalized understanding of all pre-op instructions.

## 2017-03-30 MED ORDER — CEFAZOLIN SODIUM-DEXTROSE 2-4 GM/100ML-% IV SOLN
2.0000 g | INTRAVENOUS | Status: AC
Start: 2017-03-31 — End: 2017-03-31
  Administered 2017-03-31: 2 g via INTRAVENOUS
  Filled 2017-03-30: qty 100

## 2017-03-30 MED ORDER — GABAPENTIN 300 MG PO CAPS
300.0000 mg | ORAL_CAPSULE | ORAL | Status: AC
  Administered 2017-03-31: 300 mg via ORAL
  Filled 2017-03-30: qty 1

## 2017-03-30 NOTE — H&P (Signed)
Katie [name]  Location: [facility] Patient #: [id number] DOB: March 18, 1946 Married / Language: English / Race: White Female   History of Present Illness  The patient is a 71 year old female who presents with a breast mass. This is a pleasant patient for by Dr. Michiel Cowboy after the recent diagnosis of a left breast mass. Stereotactic biopsy was performed showing apocrinemetaplasia and sclerosing adenosis. Left breast lumpectomy was then recommended for histologic evaluation as this appeared discordant with the pathologist and radiologist. She has had no problems regarding her breasts. I apparently performed a biopsy on her right breast for benign calcifications in 2003. She is otherwise without complaints. She denies nipple discharge.   Past Surgical History  Appendectomy  Breast Biopsy  Bilateral. Foot Surgery  Bilateral. Gallbladder Surgery - Laparoscopic  Gallbladder Surgery - Open  Gastric Bypass  Hysterectomy (not due to cancer) - Partial   Diagnostic Studies History Katie [name], CMA; 03/25/2017 1:53 PM) Colonoscopy  1-5 years ago Mammogram  within last year Pap Smear  1-5 years ago  Allergies Katie [name], CMA; 03/25/2017 1:54 PM) No Known Drug Allergies 03/25/2017 Allergies Reconciled   Medication History Katie [name], CMA; 03/25/2017 1:56 PM) Propranolol HCl (40MG  Tablet, Oral) Active. Cyanocobalamin (1000MCG/ML Solution, Injection) Active. NexIUM (40MG  Capsule DR, Oral) Active. Simvastatin (80MG  Tablet, Oral) Active. Venlafaxine HCl (37.5MG  Tablet, Oral) Active. Ursodiol (300MG  Capsule, Oral) Active. Aspirin (1 (one) Oral) Specific strength unknown - Active. Medications Reconciled  Social History Katie [name], [place]; 03/25/2017 1:54 PM) Caffeine use  Tea. No alcohol use  No drug use  Tobacco use  Never smoker.  Family History Katie [name], [place]; 03/25/2017 1:53 PM) Alcohol Abuse  Father. Depression   Sister. Hypertension  Sister. Respiratory Condition  Mother.  Pregnancy / Birth History Katie [name], [place]; 03/25/2017 1:54 PM) Age at menarche  12 years. Age of menopause  39-50 Contraceptive History  Oral contraceptives. Gravida  2 Maternal age  4-25 Para  2  Other Problems Katie [name], [place]; 03/25/2017 1:53 PM) Anxiety Disorder  Cholelithiasis  Gastroesophageal Reflux Disease  Hemorrhoids  Hypercholesterolemia     Review of Systems Katie [name] CMA; 03/25/2017 1:54 PM) General Not Present- Appetite Loss, Chills, Fatigue, Fever, Night Sweats, Weight Gain and Weight Loss. Skin Present- Rash. Not Present- Change in Wart/Mole, Dryness, Hives, Jaundice, New Lesions, Non-Healing Wounds and Ulcer. HEENT Present- Hearing Loss and Wears glasses/contact lenses. Not Present- Earache, Hoarseness, Nose Bleed, Oral Ulcers, Ringing in the Ears, Seasonal Allergies, Sinus Pain, Sore Throat, Visual Disturbances and Yellow Eyes. Respiratory Not Present- Bloody sputum, Chronic Cough, Difficulty Breathing, Snoring and Wheezing. Breast Not Present- Breast Mass, Breast Pain, Nipple Discharge and Skin Changes. Cardiovascular Present- Leg Cramps. Not Present- Chest Pain, Difficulty Breathing Lying Down, Palpitations, Rapid Heart Rate, Shortness of Breath and Swelling of Extremities. Gastrointestinal Present- Hemorrhoids. Not Present- Abdominal Pain, Bloating, Bloody Stool, Change in Bowel Habits, Chronic diarrhea, Constipation, Difficulty Swallowing, Excessive gas, Gets full quickly at meals, Indigestion, Nausea, Rectal Pain and Vomiting. Female Genitourinary Not Present- Frequency, Nocturia, Painful Urination, Pelvic Pain and Urgency. Musculoskeletal Not Present- Back Pain, Joint Pain, Joint Stiffness, Muscle Pain, Muscle Weakness and Swelling of Extremities. Neurological Present- Tremor. Not Present- Decreased Memory, Fainting, Headaches, Numbness, Seizures, Tingling, Trouble walking  and Weakness. Psychiatric Not Present- Anxiety, Bipolar, Change in Sleep Pattern, Depression, Fearful and Frequent crying. Endocrine Not Present- Cold Intolerance, Excessive Hunger, Hair Changes, Heat Intolerance, Hot flashes and New Diabetes. Hematology Not Present- Blood Thinners, Easy Bruising, Excessive bleeding, Gland  problems, HIV and Persistent Infections.  Vitals Katie [name] CMA; 03/25/2017 1:56 PM) 03/25/2017 1:56 PM Weight: 128 lb Height: 64in Body Surface Area: 1.62 m Body Mass Index: 21.97 kg/m  Temp.: 97.73F  Pulse: 58 (Regular)  BP: 102/60 (Sitting, Left Arm, Standard)       Physical Exam (Aysel Gilchrest A. Ninfa Linden MD; 03/25/2017 2:15 PM) General Mental Status-Alert. General Appearance-Consistent with stated age. Hydration-Well hydrated. Voice-Normal.  Head and Neck Head-normocephalic, atraumatic with no lesions or palpable masses. Trachea-midline. Thyroid Gland Characteristics - normal size and consistency.  Eye Eyeball - Bilateral-Extraocular movements intact. Sclera/Conjunctiva - Bilateral-No scleral icterus.  Chest and Lung Exam Chest and lung exam reveals -quiet, even and easy respiratory effort with no use of accessory muscles and on auscultation, normal breath sounds, no adventitious sounds and normal vocal resonance. Inspection Chest Wall - Normal. Back - normal.  Breast Breast - Left-Symmetric, Non Tender, No Biopsy scars, no Dimpling, No Inflammation, No Lumpectomy scars, No Mastectomy scars, No Peau d' Orange. Breast - Right-Symmetric, Non Tender, No Biopsy scars, no Dimpling, No Inflammation, No Lumpectomy scars, No Mastectomy scars, No Peau d' Orange. Breast Lump-No Palpable Breast Mass.  Cardiovascular Cardiovascular examination reveals -normal heart sounds, regular rate and rhythm with no murmurs and normal pedal pulses bilaterally.  Abdomen Inspection Inspection of the abdomen reveals - No Hernias. Skin  - Scar - no surgical scars. Palpation/Percussion Palpation and Percussion of the abdomen reveal - Soft, Non Tender, No Rebound tenderness, No Rigidity (guarding) and No hepatosplenomegaly. Auscultation Auscultation of the abdomen reveals - Bowel sounds normal.  Neurologic - Did not examine.  Musculoskeletal - Did not examine.  Lymphatic Head & Neck  General Head & Neck Lymphatics: Bilateral - Description - Normal. Axillary  General Axillary Region: Bilateral - Description - Normal. Tenderness - Non Tender. Femoral & Inguinal - Did not examine.    Assessment & Plan (Kamyla Olejnik A. Ninfa Linden MD; 03/25/2017 2:16 PM) LEFT BREAST MASS (N63.20) Impression: I have reviewed her mammograms and her pathology results. Radioactive seed left breast lumpectomy is recommended because of the pathologic findings which are felt to be discordant with the radiographic findings. I discussed this with the patient in detail. I discussed the surgical procedure in detail. I discussed the risks of surgery which includes but is not limited to bleeding, infection, injury to surrounding structures, need for further surgery if malignancy is found, cardio pulmonary issues, etc. She understands and wishes to proceed with surgery which will be scheduled

## 2017-03-31 ENCOUNTER — Ambulatory Visit (HOSPITAL_COMMUNITY): Payer: Medicare Other | Admitting: Anesthesiology

## 2017-03-31 ENCOUNTER — Ambulatory Visit (HOSPITAL_COMMUNITY)
Admission: RE | Admit: 2017-03-31 | Discharge: 2017-03-31 | Disposition: A | Discharge status: Home / Self Care | Payer: Medicare Other | Source: Ambulatory Visit | Attending: Surgery | Admitting: Surgery

## 2017-03-31 ENCOUNTER — Ambulatory Visit
Admission: RE | Admit: 2017-03-31 | Discharge: 2017-03-31 | Disposition: A | Discharge status: Home / Self Care | Payer: Medicare Other | Source: Ambulatory Visit | Attending: Surgery | Admitting: Surgery

## 2017-03-31 ENCOUNTER — Encounter (HOSPITAL_COMMUNITY): Payer: Self-pay

## 2017-03-31 ENCOUNTER — Encounter (HOSPITAL_COMMUNITY)
Admission: RE | Disposition: A | Discharge status: Home / Self Care | Payer: Self-pay | Source: Ambulatory Visit | Attending: Surgery

## 2017-03-31 DIAGNOSIS — N6012 Diffuse cystic mastopathy of left breast: Secondary | ICD-10-CM | POA: Diagnosis not present

## 2017-03-31 DIAGNOSIS — N6022 Fibroadenosis of left breast: Secondary | ICD-10-CM

## 2017-03-31 DIAGNOSIS — Z79899 Other long term (current) drug therapy: Secondary | ICD-10-CM | POA: Diagnosis not present

## 2017-03-31 DIAGNOSIS — K219 Gastro-esophageal reflux disease without esophagitis: Secondary | ICD-10-CM | POA: Diagnosis not present

## 2017-03-31 DIAGNOSIS — Z7982 Long term (current) use of aspirin: Secondary | ICD-10-CM | POA: Diagnosis not present

## 2017-03-31 DIAGNOSIS — Z9884 Bariatric surgery status: Secondary | ICD-10-CM | POA: Diagnosis not present

## 2017-03-31 DIAGNOSIS — F329 Major depressive disorder, single episode, unspecified: Secondary | ICD-10-CM | POA: Insufficient documentation

## 2017-03-31 DIAGNOSIS — F419 Anxiety disorder, unspecified: Secondary | ICD-10-CM | POA: Insufficient documentation

## 2017-03-31 DIAGNOSIS — I10 Essential (primary) hypertension: Secondary | ICD-10-CM | POA: Diagnosis not present

## 2017-03-31 DIAGNOSIS — E7800 Pure hypercholesterolemia, unspecified: Secondary | ICD-10-CM | POA: Diagnosis not present

## 2017-03-31 DIAGNOSIS — N6320 Unspecified lump in the left breast, unspecified quadrant: Secondary | ICD-10-CM | POA: Diagnosis present

## 2017-03-31 HISTORY — DX: Anxiety disorder, unspecified: F41.9

## 2017-03-31 HISTORY — DX: Essential (primary) hypertension: I10

## 2017-03-31 HISTORY — DX: Unspecified lump in the left breast, unspecified quadrant: N63.20

## 2017-03-31 HISTORY — PX: BREAST LUMPECTOMY WITH RADIOACTIVE SEED LOCALIZATION: SHX6424

## 2017-03-31 HISTORY — DX: Tachycardia, unspecified: R00.0

## 2017-03-31 LAB — CBC
HEMATOCRIT: 35.5 % — AB (ref 36.0–46.0)
Hemoglobin: 11.7 g/dL — ABNORMAL LOW (ref 12.0–15.0)
MCH: 30.4 pg (ref 26.0–34.0)
MCHC: 33 g/dL (ref 30.0–36.0)
MCV: 92.2 fL (ref 78.0–100.0)
Platelets: 204 10*3/uL (ref 150–400)
RBC: 3.85 MIL/uL — AB (ref 3.87–5.11)
RDW: 12.4 % (ref 11.5–15.5)
WBC: 4.1 10*3/uL (ref 4.0–10.5)

## 2017-03-31 LAB — BASIC METABOLIC PANEL
ANION GAP: 6 (ref 5–15)
BUN: 13 mg/dL (ref 6–20)
CO2: 26 mmol/L (ref 22–32)
Calcium: 8.9 mg/dL (ref 8.9–10.3)
Chloride: 106 mmol/L (ref 101–111)
Creatinine, Ser: 0.78 mg/dL (ref 0.44–1.00)
GFR calc Af Amer: >60 mL/min (ref >60)
GFR calc non Af Amer: >60 mL/min (ref >60)
GLUCOSE: 92 mg/dL (ref 65–99)
POTASSIUM: 4.3 mmol/L (ref 3.5–5.1)
Sodium: 138 mmol/L (ref 135–145)

## 2017-03-31 SURGERY — BREAST LUMPECTOMY WITH RADIOACTIVE SEED LOCALIZATION
Anesthesia: General | Site: Breast | Laterality: Left

## 2017-03-31 MED ORDER — OXYCODONE HCL 5 MG PO TABS: 5.0000 mg | ORAL_TABLET | ORAL | 0 refills | Status: DC | PRN

## 2017-03-31 MED ORDER — MIDAZOLAM HCL 5 MG/5ML IJ SOLN
INTRAMUSCULAR | Status: DC | PRN
  Administered 2017-03-31: 2 mg via INTRAVENOUS

## 2017-03-31 MED ORDER — ONDANSETRON HCL 4 MG/2ML IJ SOLN
INTRAMUSCULAR | Status: DC | PRN
  Administered 2017-03-31: 4 mg via INTRAVENOUS

## 2017-03-31 MED ORDER — FENTANYL CITRATE (PF) 250 MCG/5ML IJ SOLN
INTRAMUSCULAR | Status: AC
  Filled 2017-03-31: qty 5

## 2017-03-31 MED ORDER — CHLORHEXIDINE GLUCONATE CLOTH 2 % EX PADS: 6.0000 | MEDICATED_PAD | Freq: Once | CUTANEOUS | Status: DC

## 2017-03-31 MED ORDER — BUPIVACAINE-EPINEPHRINE 0.25% -1:200000 IJ SOLN
INTRAMUSCULAR | Status: DC | PRN
  Administered 2017-03-31: 20 mL

## 2017-03-31 MED ORDER — LIDOCAINE HCL (CARDIAC) 20 MG/ML IV SOLN
INTRAVENOUS | Status: AC
  Filled 2017-03-31: qty 5

## 2017-03-31 MED ORDER — MIDAZOLAM HCL 2 MG/2ML IJ SOLN
INTRAMUSCULAR | Status: AC
  Filled 2017-03-31: qty 2

## 2017-03-31 MED ORDER — EPHEDRINE SULFATE 50 MG/ML IJ SOLN
INTRAMUSCULAR | Status: DC | PRN
  Administered 2017-03-31 (×3): 10 mg via INTRAVENOUS

## 2017-03-31 MED ORDER — ONDANSETRON HCL 4 MG/2ML IJ SOLN
INTRAMUSCULAR | Status: AC
  Filled 2017-03-31: qty 2

## 2017-03-31 MED ORDER — SUGAMMADEX SODIUM 200 MG/2ML IV SOLN
INTRAVENOUS | Status: AC
  Filled 2017-03-31: qty 2

## 2017-03-31 MED ORDER — LIDOCAINE HCL (CARDIAC) 20 MG/ML IV SOLN
INTRAVENOUS | Status: DC | PRN
  Administered 2017-03-31: 70 mg via INTRAVENOUS

## 2017-03-31 MED ORDER — 0.9 % SODIUM CHLORIDE (POUR BTL) OPTIME
TOPICAL | Status: DC | PRN
  Administered 2017-03-31: 1000 mL

## 2017-03-31 MED ORDER — LACTATED RINGERS IV SOLN
INTRAVENOUS | Status: DC
  Administered 2017-03-31: 10:00:00 via INTRAVENOUS

## 2017-03-31 MED ORDER — BUPIVACAINE-EPINEPHRINE (PF) 0.25% -1:200000 IJ SOLN
INTRAMUSCULAR | Status: AC
  Filled 2017-03-31: qty 30

## 2017-03-31 MED ORDER — DEXAMETHASONE SODIUM PHOSPHATE 10 MG/ML IJ SOLN
INTRAMUSCULAR | Status: AC
  Filled 2017-03-31: qty 1

## 2017-03-31 MED ORDER — DEXAMETHASONE SODIUM PHOSPHATE 10 MG/ML IJ SOLN
INTRAMUSCULAR | Status: DC | PRN
  Administered 2017-03-31: 10 mg via INTRAVENOUS

## 2017-03-31 MED ORDER — PROPOFOL 10 MG/ML IV BOLUS
INTRAVENOUS | Status: AC
  Filled 2017-03-31: qty 20

## 2017-03-31 MED ORDER — EPHEDRINE 5 MG/ML INJ
INTRAVENOUS | Status: AC
  Filled 2017-03-31: qty 10

## 2017-03-31 MED ORDER — PROPOFOL 10 MG/ML IV BOLUS
INTRAVENOUS | Status: DC | PRN
  Administered 2017-03-31: 160 mg via INTRAVENOUS

## 2017-03-31 MED ORDER — ROCURONIUM BROMIDE 50 MG/5ML IV SOLN
INTRAVENOUS | Status: AC
  Filled 2017-03-31: qty 1

## 2017-03-31 SURGICAL SUPPLY — 38 items
ADH SKN CLS APL DERMABOND .7 (GAUZE/BANDAGES/DRESSINGS) ×1
APPLIER CLIP 9.375 MED OPEN (MISCELLANEOUS) ×2
APR CLP MED 9.3 20 MLT OPN (MISCELLANEOUS) ×1
BLADE SURG 15 STRL LF DISP TIS (BLADE) ×1 IMPLANT
BLADE SURG 15 STRL SS (BLADE) ×2
CANISTER SUCT 3000ML PPV (MISCELLANEOUS) ×2 IMPLANT
CHLORAPREP W/TINT 26ML (MISCELLANEOUS) ×2 IMPLANT
CLIP APPLIE 9.375 MED OPEN (MISCELLANEOUS) ×1 IMPLANT
COVER PROBE W GEL 5X96 (DRAPES) ×2 IMPLANT
COVER SURGICAL LIGHT HANDLE (MISCELLANEOUS) ×2 IMPLANT
DERMABOND ADVANCED (GAUZE/BANDAGES/DRESSINGS) ×1
DERMABOND ADVANCED .7 DNX12 (GAUZE/BANDAGES/DRESSINGS) ×1 IMPLANT
DEVICE DUBIN SPECIMEN MAMMOGRA (MISCELLANEOUS) ×2 IMPLANT
DRAPE CHEST BREAST 15X10 FENES (DRAPES) ×2 IMPLANT
DRAPE UTILITY XL STRL (DRAPES) ×2 IMPLANT
ELECT CAUTERY BLADE 6.4 (BLADE) ×2 IMPLANT
ELECT REM PT RETURN 9FT ADLT (ELECTROSURGICAL) ×2
ELECTRODE REM PT RTRN 9FT ADLT (ELECTROSURGICAL) ×1 IMPLANT
GLOVE SURG SIGNA 7.5 PF LTX (GLOVE) ×2 IMPLANT
GOWN STRL REUS W/ TWL LRG LVL3 (GOWN DISPOSABLE) ×1 IMPLANT
GOWN STRL REUS W/ TWL XL LVL3 (GOWN DISPOSABLE) ×1 IMPLANT
GOWN STRL REUS W/TWL LRG LVL3 (GOWN DISPOSABLE) ×2
GOWN STRL REUS W/TWL XL LVL3 (GOWN DISPOSABLE) ×2
KIT BASIN OR (CUSTOM PROCEDURE TRAY) ×2 IMPLANT
KIT MARKER MARGIN INK (KITS) ×2 IMPLANT
NDL HYPO 25X1 1.5 SAFETY (NEEDLE) ×1 IMPLANT
NEEDLE HYPO 25X1 1.5 SAFETY (NEEDLE) ×2 IMPLANT
NS IRRIG 1000ML POUR BTL (IV SOLUTION) ×2 IMPLANT
PACK SURGICAL SETUP 50X90 (CUSTOM PROCEDURE TRAY) ×2 IMPLANT
PENCIL BUTTON HOLSTER BLD 10FT (ELECTRODE) ×2 IMPLANT
SPONGE LAP 18X18 X RAY DECT (DISPOSABLE) ×2 IMPLANT
SUT MNCRL AB 4-0 PS2 18 (SUTURE) ×2 IMPLANT
SUT VIC AB 3-0 SH 18 (SUTURE) ×2 IMPLANT
SYR BULB 3OZ (MISCELLANEOUS) ×2 IMPLANT
SYR CONTROL 10ML LL (SYRINGE) ×2 IMPLANT
TOWEL OR 17X24 6PK STRL BLUE (TOWEL DISPOSABLE) ×2 IMPLANT
TUBE CONNECTING 12X1/4 (SUCTIONS) ×2 IMPLANT
YANKAUER SUCT BULB TIP NO VENT (SUCTIONS) ×2 IMPLANT

## 2017-03-31 NOTE — Anesthesia Procedure Notes (Signed)
Procedure Name: LMA Insertion Date/Time: 03/31/2017 10:58 AM Performed by: Luciana Axe K Pre-anesthesia Checklist: Patient identified, Emergency Drugs available, Suction available and Patient being monitored Patient Re-evaluated:Patient Re-evaluated prior to induction Oxygen Delivery Method: Circle System Utilized Preoxygenation: Pre-oxygenation with 100% oxygen Induction Type: IV induction Ventilation: Mask ventilation without difficulty LMA: LMA inserted LMA Size: 4.0 Number of attempts: 1 Airway Equipment and Method: Bite block Placement Confirmation: positive ETCO2 and breath sounds checked- equal and bilateral Tube secured with: Tape Dental Injury: Teeth and Oropharynx as per pre-operative assessment

## 2017-03-31 NOTE — Transfer of Care (Signed)
Immediate Anesthesia Transfer of Care Note  Patient: Katie [name]  Procedure(s) Performed: Procedure(s): LEFT BREAST LUMPECTOMY WITH RADIOACTIVE SEED LOCALIZATION (Left)  Patient Location: PACU  Anesthesia Type:General  Level of Consciousness: sedated and patient cooperative  Airway & Oxygen Therapy: Patient Spontanous Breathing and Patient connected to nasal cannula oxygen  Post-op Assessment: Report given to RN and Post -op Vital signs reviewed and stable  Post vital signs: Reviewed  Last Vitals:  Vitals:   03/31/17 0916 03/31/17 1140  BP: (!) 140/59 (!) 160/60  Pulse: (!) 54 (!) 54  Resp: 18 19  Temp: 36.8 C (!) 36.2 C    Last Pain:  Vitals:   03/31/17 0916  TempSrc: Oral         Complications: No apparent anesthesia complications

## 2017-03-31 NOTE — Interval H&P Note (Signed)
History and Physical Interval Note: no change in H and P  03/31/2017 9:57 AM  [place]  has presented today for surgery, with the diagnosis of left breast mass  The various methods of treatment have been discussed with the patient and family. After consideration of risks, benefits and other options for treatment, the patient has consented to  Procedure(s): LEFT BREAST LUMPECTOMY WITH RADIOACTIVE SEED LOCALIZATION (Left) as a surgical intervention .  The patient's history has been reviewed, patient examined, no change in status, stable for surgery.  I have reviewed the patient's chart and labs.  Questions were answered to the patient's satisfaction.     Merve Hotard A

## 2017-03-31 NOTE — Op Note (Signed)
LEFT BREAST LUMPECTOMY WITH RADIOACTIVE SEED LOCALIZATION  Procedure Note  Katie [name] 03/31/2017   Pre-op Diagnosis: left breast mass     Post-op Diagnosis: same  Procedure(s): LEFT BREAST LUMPECTOMY WITH RADIOACTIVE SEED LOCALIZATION  Surgeon(s): Coralie Keens, MD  Anesthesia: General  Staff:  Circulator: Tereasa Coop, RN Scrub Person: Adella Hare  Estimated Blood Loss: Minimal               Specimens: sent to path  Indications: This is [name] 71 year old female with [name] suspicious area found on mammography. This was in the left breast. Stereotactic biopsy showed [name] complex sclerosing lesion and other abnormalities. Lumpectomy has been recommended for histologic evaluation.  Procedure: The patient was brought to the operating room and identified as the correct patient. She was placed supine on the operating table and general anesthesia was induced. Her left breast and chest were then prepped and draped in the usual sterile fashion. I identified the location of the radioactive seed with the aid of the neoprobe. It was in the upper outer quadrant close to the areola. I anesthetized the edge of the areola with Marcaine and made an incision with [name] scalpel. I then dissected down to the radioactive seed with the electrocautery using the neoprobe as my guide. I then performed [name] lumpectomy staying around the radioactive seed with the neoprobe. I then completed the lumpectomy with the electrocautery. Once the specimen was removed, the seed was confirmed to be in the specimen with the neoprobe. I marked all margins with marker paint.  An x-ray was performed confirming that the radioactive seed and previous biopsy clip were in the specimen. The specimen was then sent to pathology for evaluation. I then achieved hemostasis in the lumpectomy cavity with the cautery. I anesthetized with Marcaine. I closed the subcutaneous tissue with interrupted 3-0 Vicryl sutures and closed the skin with [name]  running 4-0 Monocryl. Skin glue was then applied. The patient tolerated the procedure well. All the counts were correct at the end of the procedure. The patient was then extubated in the operating room and taken in [name] stable condition to the recovery room.          Katie [name]   Date: 03/31/2017  Time: 11:23 AM

## 2017-03-31 NOTE — Anesthesia Postprocedure Evaluation (Signed)
Anesthesia Post Note  Patient: Katie [name]  Procedure(s) Performed: Procedure(s) (LRB): LEFT BREAST LUMPECTOMY WITH RADIOACTIVE SEED LOCALIZATION (Left)     Patient location during evaluation: PACU Anesthesia Type: General Level of consciousness: awake Pain management: pain level controlled Vital Signs Assessment: post-procedure vital signs reviewed and stable Respiratory status: spontaneous breathing Cardiovascular status: stable Anesthetic complications: no    Last Vitals:  Vitals:   03/31/17 1215 03/31/17 1229  BP: (!) 156/67 (!) 159/66  Pulse: (!) 54 (!) 58  Resp: 16   Temp: (!) 36.4 C     Last Pain:  Vitals:   03/31/17 0916  TempSrc: Oral                 Espn Zeman

## 2017-03-31 NOTE — Anesthesia Preprocedure Evaluation (Addendum)
Anesthesia Evaluation  Patient identified by MRN, date of birth, ID band Patient awake    Reviewed: Allergy & Precautions, NPO status , Patient's Chart, lab work & pertinent test results, reviewed documented beta blocker date and time   Airway Mallampati: II  TM Distance: >3 FB Neck ROM: Full    Dental  (+) Teeth Intact, Dental Advisory Given   Pulmonary neg pulmonary ROS,    breath sounds clear to auscultation       Cardiovascular hypertension, Pt. on medications and Pt. on home beta blockers  Rhythm:Regular Rate:Normal     Neuro/Psych  Headaches, PSYCHIATRIC DISORDERS Anxiety Depression    GI/Hepatic Neg liver ROS, GERD  Medicated and Controlled,  Endo/Other    Renal/GU negative Renal ROS     Musculoskeletal   Abdominal   Peds  Hematology  (+) anemia ,   Anesthesia Other Findings   Reproductive/Obstetrics                            Anesthesia Physical Anesthesia Plan  ASA: III  Anesthesia Plan: General   Post-op Pain Management:    Induction: Intravenous  PONV Risk Score and Plan: 3 and Ondansetron, Dexamethasone, Propofol and Midazolam  Airway Management Planned:   Additional Equipment:   Intra-op Plan:   Post-operative Plan: Extubation in OR  Informed Consent: I have reviewed the patients History and Physical, chart, labs and discussed the procedure including the risks, benefits and alternatives for the proposed anesthesia with the patient or authorized representative who has indicated his/her understanding and acceptance.   Dental advisory given  Plan Discussed with: CRNA and Anesthesiologist  Anesthesia Plan Comments:         Anesthesia Quick Evaluation

## 2017-03-31 NOTE — Discharge Instructions (Signed)
[facility] °Office Phone Number [phone] ° °BREAST BIOPSY/ PARTIAL MASTECTOMY: POST OP INSTRUCTIONS ° °Always review your discharge instruction sheet given to you by the facility where your surgery was performed. ° °IF YOU HAVE DISABILITY OR FAMILY LEAVE FORMS, YOU MUST BRING THEM TO THE OFFICE FOR PROCESSING.  DO NOT GIVE THEM TO YOUR DOCTOR. ° °1. A prescription for pain medication may be given to you upon discharge.  Take your pain medication as prescribed, if needed.  If narcotic pain medicine is not needed, then you may take acetaminophen (Tylenol) or ibuprofen (Advil) as needed. °2. Take your usually prescribed medications unless otherwise directed °3. If you need a refill on your pain medication, please contact your pharmacy.  They will contact our office to request authorization.  Prescriptions will not be filled after 5pm or on week-ends. °4. You should eat very light the first 24 hours after surgery, such as soup, crackers, pudding, etc.  Resume your normal diet the day after surgery. °5. Most patients will experience some swelling and bruising in the breast.  Ice packs and a good support bra will help.  Swelling and bruising can take several days to resolve.  °6. It is common to experience some constipation if taking pain medication after surgery.  Increasing fluid intake and taking a stool softener will usually help or prevent this problem from occurring.  A mild laxative (Milk of Magnesia or Miralax) should be taken according to package directions if there are no bowel movements after 48 hours. °7. Unless discharge instructions indicate otherwise, you may remove your bandages 24-48 hours after surgery, and you may shower at that time.  You may have steri-strips (small skin tapes) in place directly over the incision.  These strips should be left on the skin for 7-10 days.  If your surgeon used skin glue on the incision, you may shower in 24 hours.  The glue will flake off over the  next 2-3 weeks.  Any sutures or staples will be removed at the office during your follow-up visit. °8. ACTIVITIES:  You may resume regular daily activities (gradually increasing) beginning the next day.  Wearing a good support bra or sports bra minimizes pain and swelling.  You may have sexual intercourse when it is comfortable. °a. You may drive when you no longer are taking prescription pain medication, you can comfortably wear a seatbelt, and you can safely maneuver your car and apply brakes. °b. RETURN TO WORK:  ______________________________________________________________________________________ °9. You should see your doctor in the office for a follow-up appointment approximately two weeks after your surgery.  Your doctor’s nurse will typically make your follow-up appointment when she calls you with your pathology report.  Expect your pathology report 2-3 business days after your surgery.  You may call to check if you do not hear from us after three days. °10. OTHER INSTRUCTIONS: __OK TO SHOWER TOMORROW °11. ICE PACK, TYLENOL, IBUPROFEN ALSO FOR PAIN °12. _____________________________________________________________________________________________ _____________________________________________________________________________________________________________________________________ °_____________________________________________________________________________________________________________________________________ °_____________________________________________________________________________________________________________________________________ ° °WHEN TO CALL YOUR DOCTOR: °1. Fever over 101.0 °2. Nausea and/or vomiting. °3. Extreme swelling or bruising. °4. Continued bleeding from incision. °5. Increased pain, redness, or drainage from the incision. ° °The clinic staff is available to answer your questions during regular business hours.  Please don’t hesitate to call and ask to speak to one of the nurses for  clinical concerns.  If you have a medical emergency, go to the nearest emergency room or call 911.  A surgeon from [facility] is always   on call at the hospital.  For further questions, please visit [email or web address]

## 2017-04-01 ENCOUNTER — Encounter (HOSPITAL_COMMUNITY): Payer: Self-pay | Admitting: Surgery

## 2017-04-17 ENCOUNTER — Other Ambulatory Visit: Payer: Self-pay | Admitting: Family Medicine

## 2017-04-28 ENCOUNTER — Encounter: Payer: Self-pay | Admitting: Family Medicine

## 2017-04-28 ENCOUNTER — Ambulatory Visit (INDEPENDENT_AMBULATORY_CARE_PROVIDER_SITE_OTHER): Payer: Medicare Other | Admitting: Family Medicine

## 2017-04-28 DIAGNOSIS — R195 Other fecal abnormalities: Secondary | ICD-10-CM | POA: Insufficient documentation

## 2017-04-28 DIAGNOSIS — R21 Rash and other nonspecific skin eruption: Secondary | ICD-10-CM | POA: Diagnosis not present

## 2017-04-28 DIAGNOSIS — H9201 Otalgia, right ear: Secondary | ICD-10-CM

## 2017-04-28 MED ORDER — LORATADINE 10 MG PO TABS: 10.0000 mg | ORAL_TABLET | Freq: Every day | ORAL | 11 refills | Status: DC

## 2017-04-28 NOTE — Assessment & Plan Note (Signed)
Possibly eustachian tube dysfunction given discomfort and minimal vertigo. No upper respiratory congestion. Benign exam. Negative Dix-Hallpike. Discussed trying Claritin and seeing if this improves. If not improving over the next several days to week we can have her see ENT.

## 2017-04-28 NOTE — Assessment & Plan Note (Signed)
Somewhat nonspecific rash. No signs of infection. Discussed trying topical over-the-counter hydrocortisone ointment. If not improving she should see her dermatologist.

## 2017-04-28 NOTE — Assessment & Plan Note (Signed)
Potentially with viral illness leading to loose stools and possibly ear discomfort. She has benign exam. No blood in her stool. No abdominal pain. Discussed monitoring and if not improving being reevaluated.

## 2017-04-28 NOTE — Progress Notes (Addendum)
  Tommi Rumps, MD Phone: [phone]  Katie [name] is a 71 y.o. female who presents today for same-day visit.  Patient notes several weeks of right ear discomfort. Notes it just feels itchy and full at times. Notes minimal vertigo symptoms that last for seconds and go away on their own. Do not happen at any particular time or with any particular movements. No hearing issues. No congestion. No tinnitus. She does note chronic deafness and tinnitus in her left ear though no symptoms in her right ear. She notes some nausea after eating that has been going on several weeks as well. She has no abdominal pain, fevers, or vomiting. She notes some loose stools as well. No blood in her stool.  She notes over the last several weeks she's had a slight red rash on her right anterior lower leg. No itching or pain. She's not tried anything other than antibiotic ointment on it.  PMH: nonsmoker.   ROS see history of present illness  Objective  Physical Exam Vitals:   04/28/17 1323  BP: 130/70  Pulse: 63  Temp: 98.5 F (36.9 C)  SpO2: 97%    BP Readings from Last 3 Encounters:  04/28/17 130/70  03/31/17 (!) 159/66  02/21/17 104/60   Wt Readings from Last 3 Encounters:  04/28/17 125 lb (56.7 kg)  03/31/17 128 lb (58.1 kg)  02/21/17 124 lb 12.8 oz (56.6 kg)    Physical Exam  Constitutional: No distress.  HENT:  Head: Normocephalic and atraumatic.  Mouth/Throat: Oropharynx is clear and moist. No oropharyngeal exudate.  Normal TMs bilaterally  Eyes: Pupils are equal, round, and reactive to light. Conjunctivae are normal.  Neck: Neck supple.  Cardiovascular: Normal rate, regular rhythm and normal heart sounds.   Pulmonary/Chest: Effort normal.  Abdominal: Soft. Bowel sounds are normal. She exhibits no distension. There is no tenderness. There is no rebound and no guarding.  Musculoskeletal: She exhibits no edema.  Lymphadenopathy:    She has no cervical adenopathy.  Neurological:  She is alert.  CN 2-12 intact, 5/5 strength in bilateral biceps, triceps, grip, quads, hamstrings, plantar and dorsiflexion, sensation to light touch intact in bilateral UE and LE, normal gait, negative Dix-Hallpike  Skin: She is not diaphoretic.  Slight erythematous macular rash over her anterior tibia, nontender, no induration     Assessment/Plan: Please see individual problem list.  Right ear pain Possibly eustachian tube dysfunction given discomfort and minimal vertigo. No upper respiratory congestion. Benign exam. Negative Dix-Hallpike. Discussed trying Claritin and seeing if this improves. If not improving over the next several days to week we can have her see ENT.  Loose stools Potentially with viral illness leading to loose stools and possibly ear discomfort. She has benign exam. No blood in her stool. No abdominal pain. Discussed monitoring and if not improving being reevaluated.  Rash Somewhat nonspecific rash. No signs of infection. Discussed trying topical over-the-counter hydrocortisone ointment. If not improving she should see her dermatologist.   No orders of the defined types were placed in this encounter.   Meds ordered this encounter  Medications  . loratadine (CLARITIN) 10 MG tablet    Sig: Take 1 tablet (10 mg total) by mouth daily.    Dispense:  30 tablet    Refill:  [place], MD [place]

## 2017-04-28 NOTE — Patient Instructions (Signed)
Nice to see you. Your symptoms may be related to eustachian tube dysfunction. We'll have you try Claritin and see if that is beneficial. Please monitor your loose stools and nausea. If these things do not improve please let us know. Please try hydrocortisone ointment for the area on your lower leg. If this does not improve please see your dermatologist. If you develop fevers, abdominal pain, blood in your stool, worsening diarrhea, or any worsening symptoms or new symptoms please be evaluated.

## 2017-05-04 ENCOUNTER — Other Ambulatory Visit: Payer: Self-pay | Admitting: Family Medicine

## 2017-05-17 ENCOUNTER — Other Ambulatory Visit: Payer: Self-pay | Admitting: Family Medicine

## 2017-05-20 ENCOUNTER — Other Ambulatory Visit: Payer: Self-pay | Admitting: Family Medicine

## 2017-05-20 NOTE — Telephone Encounter (Signed)
Rx sent for Effexor#90 no refills. Rx request for Vit. B12 injection  Last OV 05/30/2016 Last refilled 11/28/2016 No new appt.

## 2017-05-21 NOTE — Telephone Encounter (Signed)
Please advise for Rx Vitamin B12 injection for [facility]. Please advise for refills

## 2017-05-21 NOTE — Telephone Encounter (Signed)
Okay to refill both as requested.

## 2017-05-22 NOTE — Telephone Encounter (Signed)
Rx refilled and sent to [place]. Patient needs to schedule appt for further refills

## 2017-06-10 ENCOUNTER — Other Ambulatory Visit: Payer: Self-pay | Admitting: Family Medicine

## 2017-06-16 ENCOUNTER — Other Ambulatory Visit: Payer: Self-pay | Admitting: Family Medicine

## 2017-06-17 ENCOUNTER — Other Ambulatory Visit: Payer: Self-pay | Admitting: Family Medicine

## 2017-06-18 NOTE — Telephone Encounter (Signed)
Last office visit 06/02/16 No office visit scheduled

## 2017-06-18 NOTE — Telephone Encounter (Signed)
Last refill 05/20/17 Last OV 05/30/16, APPT SCHEDULED FOR 06/23/17  Ok to refill?

## 2017-06-19 ENCOUNTER — Other Ambulatory Visit: Payer: Self-pay | Admitting: Family Medicine

## 2017-06-23 ENCOUNTER — Encounter: Payer: Self-pay | Admitting: Family Medicine

## 2017-06-23 ENCOUNTER — Ambulatory Visit (INDEPENDENT_AMBULATORY_CARE_PROVIDER_SITE_OTHER): Payer: Medicare Other | Admitting: Family Medicine

## 2017-06-23 VITALS — BP 112/68 | HR 68 | Temp 98.3°F | Wt 129.0 lb

## 2017-06-23 DIAGNOSIS — F339 Major depressive disorder, recurrent, unspecified: Secondary | ICD-10-CM

## 2017-06-23 MED ORDER — VENLAFAXINE HCL 37.5 MG PO TABS: ORAL_TABLET | ORAL | 3 refills | Status: DC

## 2017-06-23 NOTE — Progress Notes (Signed)
Subjective:   Patient ID: Katie [name], female    DOB: 1945/09/19, 71 y.o.   MRN: [id number]  Katie [name] is a pleasant 71 y.o. year old female who presents to clinic today with Medication Refill (Effexor)  on 06/23/2017  HPI:  Depression- feels symptoms well controlled on current dose of effexor. Denies any symptoms of anxiety or depression.   Current Outpatient Prescriptions on File Prior to Visit  Medication Sig Dispense Refill  . aspirin 81 MG tablet Take 81 mg by mouth daily.    . Biotin 1 MG CAPS Take 1 mg by mouth daily.     . cyanocobalamin (,VITAMIN B-12,) 1000 MCG/ML injection ADMINISTER 1 ML IN THE MUSCLE EVERY 6 WEEKS AS DIRECTED (Patient taking differently: ADMINISTER 1 ML IN THE MUSCLE EVERY MONTH) 2 mL 4  . Multiple Vitamin (MULTI-VITAMINS) TABS Take 1 tablet by mouth daily.     Marland Kitchen NEXIUM 40 MG capsule TAKE 1 CAPSULE BY MOUTH TWICE DAILY 60 capsule 0  . propranolol (INDERAL) 40 MG tablet TAKE 1 TABLET(40 MG) BY MOUTH TWICE DAILY 60 tablet 0  . simvastatin (ZOCOR) 80 MG tablet TAKE 1 TABLET(80 MG) BY MOUTH AT BEDTIME 90 tablet 2  . ursodiol (ACTIGALL) 250 MG tablet TAKE 1 TABLET(250 MG) BY MOUTH THREE TIMES DAILY 90 tablet 0  . venlafaxine (EFFEXOR) 37.5 MG tablet TAKE 2 TABLETS BY MOUTH EVERY MORNING, AND 1 TABLET EVERY EVENING 270 tablet 2   No current facility-administered medications on file prior to visit.     Allergies  Allergen Reactions  . No Known Allergies     Past Medical History:  Diagnosis Date  . Anxiety   . Bile duct stricture   . Breast mass, left   . Choledocholithiasis    recurrent  . GERD (gastroesophageal reflux disease)   . Hypertension   . Migraine without aura, without mention of intractable migraine without mention of status migrainosus 01/03/2014  . Rapid heart beat     Past Surgical History:  Procedure Laterality Date  . ABDOMINAL HYSTERECTOMY    . BLADDER SURGERY     x 2  . BREAST EXCISIONAL BIOPSY Right 2003   benign  .  BREAST LUMPECTOMY     right  . BREAST LUMPECTOMY WITH RADIOACTIVE SEED LOCALIZATION Left 03/31/2017   Procedure: LEFT BREAST LUMPECTOMY WITH RADIOACTIVE SEED LOCALIZATION;  Surgeon: Coralie Keens, MD;  Location: [facility];  Service: General;  Laterality: Left;  . CHOLECYSTECTOMY    . COLONOSCOPY    . Explor Lap, Hepatoduodenostomy  07/23/11   Dr Lazarus Gowda, [facility]  . FOOT SURGERY     both feet  . GASTRIC BYPASS  05/04/2013  . PARTIAL HYSTERECTOMY    . TUBAL LIGATION    . VEIN LIGATION AND STRIPPING     left leg  . WRIST SURGERY     right    Family History  Problem Relation Age of Onset  . Cancer Mother        lung, smoker  . Alcohol abuse Father   . GER disease Sister   . GER disease Brother   . Depression Sister   . Anxiety disorder Sister   . Hypertension Sister   . Migraines Neg Hx     Social History   Social History  . Marital status: Married    Spouse name: N/A  . Number of children: 2  . Years of education: college   Occupational History  . retired   .  Retired  Social History Main Topics  . Smoking status: Never Smoker  . Smokeless tobacco: Never Used  . Alcohol use No  . Drug use: No  . Sexual activity: Yes   Other Topics Concern  . Not on file   Social History Narrative   Does not have a living will or HPOA.   Desires CPR.   Does not want prolonged life support.   The PMH, PSH, Social History, Family History, Medications, and allergies have been reviewed in [facility], and have been updated if relevant.   Review of Systems  Psychiatric/Behavioral: Negative.  Negative for agitation, behavioral problems, confusion, decreased concentration, dysphoric mood, hallucinations, self-injury, sleep disturbance and suicidal ideas. The patient is not nervous/anxious and is not hyperactive.   All other systems reviewed and are negative.      Objective:    BP 112/68   Pulse 68   Temp 98.3 F (36.8 C) (Oral)   Wt 129 lb (58.5 kg)   SpO2 99%   BMI 22.14 kg/m     Physical Exam   General:  Well-developed,well-nourished,in no acute distress; alert,appropriate and cooperative throughout examination Head:  normocephalic and atraumatic.   Eyes:  vision grossly intact, PERRL Ears:  R ear normal and L ear normal externally, TMs clear bilaterally Nose:  no external deformity.   Mouth:  good dentition.   Neck:  No deformities, masses, or tenderness noted. Lungs:  Normal respiratory effort, chest expands symmetrically. Lungs are clear to auscultation, no crackles or wheezes. Heart:  Normal rate and regular rhythm. S1 and S2 normal without gallop, murmur, click, rub or other extra sounds. Msk:  No deformity or scoliosis noted of thoracic or lumbar spine.   Extremities:  No clubbing, cyanosis, edema, or deformity noted with normal full range of motion of all joints.   Neurologic:  alert & oriented X3 and gait normal.   Skin:  Intact without suspicious lesions or rashes Psych:  Cognition and judgment appear intact. Alert and cooperative with normal attention span and concentration. No apparent delusions, illusions, hallucinations       Assessment & Plan:   Episode of recurrent major depressive disorder, unspecified depression episode severity ([place]) No Follow-up on file.

## 2017-06-23 NOTE — Assessment & Plan Note (Signed)
>  15 minutes spent in face to face time with patient, >50% spent in counselling or coordination of care. Symptoms well controlled on current dose of Effexor. eRx refills sent.

## 2017-07-10 ENCOUNTER — Other Ambulatory Visit: Payer: Self-pay | Admitting: Family Medicine

## 2017-07-15 ENCOUNTER — Other Ambulatory Visit: Payer: Self-pay

## 2017-07-15 ENCOUNTER — Other Ambulatory Visit: Payer: Self-pay | Admitting: Family Medicine

## 2017-08-08 ENCOUNTER — Other Ambulatory Visit: Payer: Self-pay | Admitting: Family Medicine

## 2017-09-07 ENCOUNTER — Other Ambulatory Visit: Payer: Self-pay | Admitting: Family Medicine

## 2017-10-06 ENCOUNTER — Other Ambulatory Visit: Payer: Self-pay | Admitting: Family Medicine

## 2017-11-04 ENCOUNTER — Ambulatory Visit: Payer: Medicare Other | Admitting: Podiatry

## 2017-11-04 ENCOUNTER — Encounter: Payer: Self-pay | Admitting: Podiatry

## 2017-11-04 ENCOUNTER — Ambulatory Visit: Payer: Medicare Other

## 2017-11-04 ENCOUNTER — Other Ambulatory Visit: Payer: Self-pay | Admitting: Family Medicine

## 2017-11-04 ENCOUNTER — Ambulatory Visit (INDEPENDENT_AMBULATORY_CARE_PROVIDER_SITE_OTHER): Payer: Medicare Other

## 2017-11-04 DIAGNOSIS — M722 Plantar fascial fibromatosis: Secondary | ICD-10-CM

## 2017-11-06 NOTE — Progress Notes (Signed)
   Subjective: Patient presents today for pain and tenderness to the plantar aspects of bilateral heels that began 4-6 weeks ago. She states the left foot is worse than the right. She states that it hurts in the mornings with the first steps out of bed. She has done nothing to treat the symptoms. Patient presents today for further treatment and evaluation.   Past Medical History:  Diagnosis Date  . Anxiety   . Bile duct stricture   . Breast mass, left   . Choledocholithiasis    recurrent  . GERD (gastroesophageal reflux disease)   . Hypertension   . Migraine without aura, without mention of intractable migraine without mention of status migrainosus 01/03/2014  . Rapid heart beat      Objective: Physical Exam General: The patient is alert and oriented x3 in no acute distress.  Dermatology: Skin is warm, dry and supple bilateral lower extremities. Negative for open lesions or macerations bilateral.   Vascular: Dorsalis Pedis and Posterior Tibial pulses palpable bilateral.  Capillary fill time is immediate to all digits.  Neurological: Epicritic and protective threshold intact bilateral.   Musculoskeletal: Tenderness to palpation at the medial calcaneal tubercale and through the insertion of the plantar fascia of the left foot. All other joints range of motion within normal limits bilateral. Strength 5/5 in all groups bilateral.   Radiographic exam:   Normal osseous mineralization. Joint spaces preserved. No fracture/dislocation/boney destruction. Calcaneal spur present with mild thickening of plantar fascia left. No other soft tissue abnormalities or radiopaque foreign bodies.   Assessment: 1. Plantar fasciitis left foot  Plan of Care:  1. Patient evaluated. Xrays reviewed.   2. Injection of 0.5cc Celestone soluspan injected into the left plantar fascia.  3. Rx for Mobic 15 mg ordered for patient. 4. Plantar fascial band(s) dispensed  5. Instructed patient regarding therapies  and modalities at home to alleviate symptoms.  6. Return to clinic in 4 weeks.    Works at [employer].    Edrick Kins, DPM [facility]  Dr. Edrick Kins, DPM    [address], [place] [address]                Office [phone]  Fax [phone]

## 2017-11-07 ENCOUNTER — Ambulatory Visit: Payer: Medicare Other | Admitting: Podiatry

## 2017-11-07 MED ORDER — MELOXICAM 15 MG PO TABS: 15.0000 mg | ORAL_TABLET | Freq: Every day | ORAL | 3 refills | Status: DC

## 2017-11-08 ENCOUNTER — Other Ambulatory Visit: Payer: Self-pay | Admitting: Family Medicine

## 2017-12-07 ENCOUNTER — Other Ambulatory Visit: Payer: Self-pay | Admitting: Family Medicine

## 2017-12-09 ENCOUNTER — Ambulatory Visit: Payer: Medicare Other | Admitting: Podiatry

## 2017-12-09 ENCOUNTER — Encounter: Payer: Self-pay | Admitting: Podiatry

## 2017-12-09 DIAGNOSIS — M722 Plantar fascial fibromatosis: Secondary | ICD-10-CM | POA: Diagnosis not present

## 2017-12-11 NOTE — Progress Notes (Signed)
   HPI: 72 year old female presenting today for follow up evaluation of bilateral plantar fasciitis. She states she is doing much better and the pain has resolved. She states the injection, wearing the fascial brace and taking Mobic all help to alleviate her symptoms. Patient is here for further evaluation and treatment.   Past Medical History:  Diagnosis Date  . Anxiety   . Bile duct stricture   . Breast mass, left   . Choledocholithiasis    recurrent  . GERD (gastroesophageal reflux disease)   . Hypertension   . Migraine without aura, without mention of intractable migraine without mention of status migrainosus 01/03/2014  . Rapid heart beat      Physical Exam: General: The patient is alert and oriented x3 in no acute distress.  Dermatology: Skin is warm, dry and supple bilateral lower extremities. Negative for open lesions or macerations.  Vascular: Palpable pedal pulses bilaterally. No edema or erythema noted. Capillary refill within normal limits.  Neurological: Epicritic and protective threshold grossly intact bilaterally.   Musculoskeletal Exam: Range of motion within normal limits to all pedal and ankle joints bilateral. Muscle strength 5/5 in all groups bilateral.   Assessment: - plantar fasciitis bilateral - resolved    Plan of Care:  - Patient evaluated.   - Recommended good shoe gear. - Continue taking Meloxicam and wearing plantar fascial brace as needed.  - Return to clinic as needed.   Works at [employer].   Edrick Kins, DPM [facility]  Dr. Edrick Kins, DPM    [address], [place] [address]                Office [phone]  Fax [phone]

## 2018-01-03 ENCOUNTER — Other Ambulatory Visit: Payer: Self-pay | Admitting: Family Medicine

## 2018-01-07 ENCOUNTER — Other Ambulatory Visit: Payer: Self-pay | Admitting: Family Medicine

## 2018-01-08 NOTE — Progress Notes (Deleted)
Subjective:   Katie [name] is a 72 y.o. female who presents for Medicare Annual (Subsequent) preventive examination.  Review of Systems: No ROS.  Medicare Wellness Visit. Additional risk factors are reflected in the social history.    Sleep patterns:    Home Safety/Smoke Alarms: Feels safe in home. Smoke alarms in place.  Living environment; residence and Firearm Safety:   Female:       Mammo-utd       Dexa scan-        CCS- 2007     Objective:     Vitals: There were no vitals taken for this visit.  There is no height or weight on file to calculate BMI.  Advanced Directives 03/31/2017 04/19/2016 02/03/2015  Does Patient Have a Medical Advance Directive? No No No  Would patient like information on creating a medical advance directive? No - Patient declined Yes - Scientist, clinical (histocompatibility and immunogenetics) given Yes - Scientist, clinical (histocompatibility and immunogenetics) given    Tobacco Social History   Tobacco Use  Smoking Status Never Smoker  Smokeless Tobacco Never Used     Counseling given: Not Answered   Clinical Intake:                       Past Medical History:  Diagnosis Date  . Anxiety   . Bile duct stricture   . Breast mass, left   . Choledocholithiasis    recurrent  . GERD (gastroesophageal reflux disease)   . Hypertension   . Migraine without aura, without mention of intractable migraine without mention of status migrainosus 01/03/2014  . Rapid heart beat    Past Surgical History:  Procedure Laterality Date  . ABDOMINAL HYSTERECTOMY    . BLADDER SURGERY     x 2  . BREAST EXCISIONAL BIOPSY Right 2003   benign  . BREAST LUMPECTOMY     right  . BREAST LUMPECTOMY WITH RADIOACTIVE SEED LOCALIZATION Left 03/31/2017   Procedure: LEFT BREAST LUMPECTOMY WITH RADIOACTIVE SEED LOCALIZATION;  Surgeon: Coralie Keens, MD;  Location: [facility];  Service: General;  Laterality: Left;  . CHOLECYSTECTOMY    . COLONOSCOPY    . Explor Lap, Hepatoduodenostomy  07/23/11   Dr Lazarus Gowda, [facility]  . FOOT SURGERY      both feet  . GASTRIC BYPASS  05/04/2013  . PARTIAL HYSTERECTOMY    . TUBAL LIGATION    . VEIN LIGATION AND STRIPPING     left leg  . WRIST SURGERY     right   Family History  Problem Relation Age of Onset  . Cancer Mother        lung, smoker  . Alcohol abuse Father   . GER disease Sister   . GER disease Brother   . Depression Sister   . Anxiety disorder Sister   . Hypertension Sister   . Migraines Neg Hx    Social History   Socioeconomic History  . Marital status: Married    Spouse name: Not on file  . Number of children: 2  . Years of education: college  . Highest education level: Not on file  Occupational History  . Occupation: retired    Fish farm manager: RETIRED  Social Needs  . Financial resource strain: Not on file  . Food insecurity:    Worry: Not on file    Inability: Not on file  . Transportation needs:    Medical: Not on file    Non-medical: Not on file  Tobacco Use  . Smoking  status: Never Smoker  . Smokeless tobacco: Never Used  Substance and Sexual Activity  . Alcohol use: No  . Drug use: No  . Sexual activity: Yes  Lifestyle  . Physical activity:    Days per week: Not on file    Minutes per session: Not on file  . Stress: Not on file  Relationships  . Social connections:    Talks on phone: Not on file    Gets together: Not on file    Attends religious service: Not on file    Active member of club or organization: Not on file    Attends meetings of clubs or organizations: Not on file    Relationship status: Not on file  Other Topics Concern  . Not on file  Social History Narrative   Does not have a living will or HPOA.   Desires CPR.   Does not want prolonged life support.    Outpatient Encounter Medications as of 01/14/2018  Medication Sig  . aspirin 81 MG tablet Take 81 mg by mouth daily.  . Biotin 1 MG CAPS Take 1 mg by mouth daily.   . cyanocobalamin (,VITAMIN B-12,) 1000 MCG/ML injection INJECT 1 ML IN THE MUSCLE EVERY 6 WEEKS AS  DIRECTED  . meloxicam (MOBIC) 15 MG tablet Take 1 tablet (15 mg total) by mouth daily.  . Multiple Vitamin (MULTI-VITAMINS) TABS Take 1 tablet by mouth daily.   Marland Kitchen NEXIUM 40 MG capsule TAKE 1 CAPSULE BY MOUTH TWICE DAILY  . propranolol (INDERAL) 40 MG tablet TAKE 1 TABLET BY MOUTH TWICE DAILY  . simvastatin (ZOCOR) 80 MG tablet TAKE 1 TABLET(80 MG) BY MOUTH AT BEDTIME  . ursodiol (ACTIGALL) 250 MG tablet TAKE 1 TABLET(250 MG) BY MOUTH THREE TIMES DAILY  . venlafaxine (EFFEXOR) 37.5 MG tablet TAKE 2 TABLETS BY MOUTH EVERY MORNING, AND 1 TABLET EVERY EVENING  . venlafaxine (EFFEXOR) 37.5 MG tablet TAKE 2 TABLETS BY MOUTH EVERY MORNING AND 1 TABLET EVERY EVENING  . Vitamin D, Ergocalciferol, (DRISDOL) 50000 units CAPS capsule TK ONE C PO Q WEEK FOR 6 WKS   No facility-administered encounter medications on file as of 01/14/2018.     Activities of Daily Living In your present state of health, do you have any difficulty performing the following activities: 03/31/2017  Hearing? Y  Comment deaf in left ear  Vision? N  Difficulty concentrating or making decisions? N  Walking or climbing stairs? N  Dressing or bathing? N  Some recent data might be hidden    Patient Care Team: Lucille Passy, MD as PCP - General (Family Medicine) Kathrynn Ducking, MD as Consulting Physician (Neurology) Rolm Bookbinder, MD as Consulting Physician (Dermatology) Molli Posey, MD as Consulting Physician (Obstetrics and Gynecology) Thelma Comp, [place] as Consulting Physician (Optometry)    Assessment:   This is a routine wellness examination for Katie [name]. .nocpe   Exercise Activities and Dietary recommendations   Diet (meal preparation, eat out, water intake, caffeinated beverages, dairy products, fruits and vegetables): {Desc; diets:16563} Breakfast: Lunch:  Dinner:      Goals    None      Fall Risk Fall Risk  02/21/2017 04/19/2016 08/24/2014 06/17/2013  Falls in the past year? No No No No    Depression  Screen PHQ 2/9 Scores 04/19/2016 08/24/2014 06/17/2013  PHQ - 2 Score 0 0 0     Cognitive Function MMSE - Mini Mental State Exam 04/19/2016  Orientation to time 5  Orientation to Place 5  Registration 3  Attention/ Calculation 0  Recall 3  Language- name 2 objects 0  Language- repeat 1  Language- follow 3 step command 3  Language- read & follow direction 0  Write a sentence 0  Copy design 0  Total score 20          Screening Tests Health Maintenance  Topic Date Due  . INFLUENZA VACCINE  04/16/2018  . TETANUS/TDAP  12/09/2018  . MAMMOGRAM  02/21/2019  . COLONOSCOPY  07/18/2023  . DEXA SCAN  Completed  . Hepatitis C Screening  Completed  . PNA vac Low Risk Adult  Completed       Plan:   ***   I have personally reviewed and noted the following in the patient's chart:   . Medical and social history . Use of alcohol, tobacco or illicit drugs  . Current medications and supplements . Functional ability and status . Nutritional status . Physical activity . Advanced directives . List of other physicians . Hospitalizations, surgeries, and ER visits in previous 12 months . Vitals . Screenings to include cognitive, depression, and falls . Referrals and appointments  In addition, I have reviewed and discussed with patient certain preventive protocols, quality metrics, and best practice recommendations. A written personalized care plan for preventive services as well as general preventive health recommendations were provided to patient.     Naaman Plummer [place], [place]  01/08/2018

## 2018-01-14 ENCOUNTER — Encounter: Payer: Medicare Other | Admitting: Family Medicine

## 2018-01-14 ENCOUNTER — Ambulatory Visit: Payer: Medicare Other | Admitting: Behavioral Health

## 2018-01-20 ENCOUNTER — Other Ambulatory Visit: Payer: Self-pay | Admitting: Family Medicine

## 2018-01-27 ENCOUNTER — Ambulatory Visit (INDEPENDENT_AMBULATORY_CARE_PROVIDER_SITE_OTHER): Payer: Medicare Other

## 2018-01-27 ENCOUNTER — Encounter: Payer: Self-pay | Admitting: Podiatry

## 2018-01-27 ENCOUNTER — Ambulatory Visit: Payer: Medicare Other | Admitting: Podiatry

## 2018-01-27 DIAGNOSIS — M779 Enthesopathy, unspecified: Secondary | ICD-10-CM

## 2018-01-27 DIAGNOSIS — M778 Other enthesopathies, not elsewhere classified: Secondary | ICD-10-CM

## 2018-01-27 MED ORDER — MELOXICAM 15 MG PO TABS: 15.0000 mg | ORAL_TABLET | Freq: Every day | ORAL | 3 refills | Status: DC

## 2018-01-30 NOTE — Progress Notes (Signed)
   HPI: 72 year old female presenting today with a chief complaint of dorsal left foot pain that began one week ago. She reports associated swelling of the left leg and foot. Walking increases the pain. She has been wearing her fascial brace and taking Meloxicam which helped alleviate the pain. Patient is here for further evaluation and treatment.   Past Medical History:  Diagnosis Date  . Anxiety   . Bile duct stricture   . Breast mass, left   . Choledocholithiasis    recurrent  . GERD (gastroesophageal reflux disease)   . Hypertension   . Migraine without aura, without mention of intractable migraine without mention of status migrainosus 01/03/2014  . Rapid heart beat      Physical Exam: General: The patient is alert and oriented x3 in no acute distress.  Dermatology: Skin is warm, dry and supple bilateral lower extremities. Negative for open lesions or macerations.  Vascular: Palpable pedal pulses bilaterally. No edema or erythema noted. Capillary refill within normal limits.  Neurological: Epicritic and protective threshold grossly intact bilaterally.   Musculoskeletal Exam: Pain with palpation to the dorsum of the left foot. Range of motion within normal limits to all pedal and ankle joints bilateral. Muscle strength 5/5 in all groups bilateral.   Radiographic Exam:  Normal osseous mineralization. Joint spaces preserved. No fracture/dislocation/boney destruction.    Assessment: 1. Left foot capsulitis    Plan of Care:  1. Patient evaluated. X-Rays reviewed.  2. Continue taking Meloxicam.  3. Compression anklet dispensed.  4. Recommended good shoe gear.  5. Return to clinic as needed.       Edrick Kins, DPM [facility]  Dr. Edrick Kins, DPM    [address],  [address]                Office [phone]  Fax [phone]

## 2018-02-07 ENCOUNTER — Other Ambulatory Visit: Payer: Self-pay | Admitting: Family Medicine

## 2018-02-16 ENCOUNTER — Other Ambulatory Visit: Payer: Self-pay | Admitting: Family Medicine

## 2018-02-17 NOTE — Telephone Encounter (Signed)
Pt must sched appt first/she no-showed on 5.1.19/not seen since Oct 2018 by TA/thx dmf

## 2018-02-24 ENCOUNTER — Ambulatory Visit: Payer: Medicare Other | Admitting: Nurse Practitioner

## 2018-03-06 ENCOUNTER — Telehealth: Payer: Self-pay

## 2018-03-06 NOTE — Telephone Encounter (Signed)
Copied from [place] [phone]. Topic: General - Other >> Mar 06, 2018  2:58 PM Carolyn Stare wrote:  Pt was a pt of Dr Deborra Medina and would like to transfer to Dr McLean-Scocuzza and is asking if she will except her    [phone]

## 2018-03-26 ENCOUNTER — Other Ambulatory Visit: Payer: Self-pay | Admitting: Podiatry

## 2018-04-07 ENCOUNTER — Other Ambulatory Visit: Payer: Self-pay | Admitting: Family Medicine

## 2018-04-07 NOTE — Telephone Encounter (Signed)
Pt must sched appt first/thx dmf 

## 2018-04-08 ENCOUNTER — Other Ambulatory Visit: Payer: Self-pay | Admitting: Family Medicine

## 2018-04-10 ENCOUNTER — Ambulatory Visit: Payer: Medicare Other | Admitting: Internal Medicine

## 2018-04-10 ENCOUNTER — Encounter: Payer: Self-pay | Admitting: Internal Medicine

## 2018-04-10 ENCOUNTER — Ambulatory Visit (INDEPENDENT_AMBULATORY_CARE_PROVIDER_SITE_OTHER): Payer: Medicare Other

## 2018-04-10 VITALS — BP 158/62 | HR 56 | Temp 98.3°F | Ht 64.0 in | Wt 123.4 lb

## 2018-04-10 DIAGNOSIS — Z85828 Personal history of other malignant neoplasm of skin: Secondary | ICD-10-CM

## 2018-04-10 DIAGNOSIS — I10 Essential (primary) hypertension: Secondary | ICD-10-CM

## 2018-04-10 DIAGNOSIS — Z1329 Encounter for screening for other suspected endocrine disorder: Secondary | ICD-10-CM

## 2018-04-10 DIAGNOSIS — E785 Hyperlipidemia, unspecified: Secondary | ICD-10-CM

## 2018-04-10 DIAGNOSIS — M545 Low back pain, unspecified: Secondary | ICD-10-CM

## 2018-04-10 DIAGNOSIS — Z0184 Encounter for antibody response examination: Secondary | ICD-10-CM

## 2018-04-10 DIAGNOSIS — G8929 Other chronic pain: Secondary | ICD-10-CM

## 2018-04-10 DIAGNOSIS — R002 Palpitations: Secondary | ICD-10-CM

## 2018-04-10 DIAGNOSIS — Z1231 Encounter for screening mammogram for malignant neoplasm of breast: Secondary | ICD-10-CM | POA: Diagnosis not present

## 2018-04-10 DIAGNOSIS — Z1211 Encounter for screening for malignant neoplasm of colon: Secondary | ICD-10-CM

## 2018-04-10 DIAGNOSIS — Z1159 Encounter for screening for other viral diseases: Secondary | ICD-10-CM

## 2018-04-10 DIAGNOSIS — M25552 Pain in left hip: Secondary | ICD-10-CM | POA: Diagnosis not present

## 2018-04-10 DIAGNOSIS — H3530 Unspecified macular degeneration: Secondary | ICD-10-CM | POA: Insufficient documentation

## 2018-04-10 DIAGNOSIS — Z1389 Encounter for screening for other disorder: Secondary | ICD-10-CM

## 2018-04-10 DIAGNOSIS — E538 Deficiency of other specified B group vitamins: Secondary | ICD-10-CM

## 2018-04-10 DIAGNOSIS — E559 Vitamin D deficiency, unspecified: Secondary | ICD-10-CM

## 2018-04-10 DIAGNOSIS — M722 Plantar fascial fibromatosis: Secondary | ICD-10-CM

## 2018-04-10 MED ORDER — CYANOCOBALAMIN 1000 MCG/ML IJ SOLN: INTRAMUSCULAR | 11 refills | Status: DC

## 2018-04-10 NOTE — Progress Notes (Signed)
Pre visit review using our clinic review tool, if applicable. No additional management support is needed unless otherwise documented below in the visit note. 

## 2018-04-10 NOTE — Patient Instructions (Addendum)
F/u in 3 months  sch fasting labs    Recombinant Zoster (Shingles) Vaccine, RZV: What You Need to Know 1. Why get vaccinated? Shingles (also called herpes zoster, or just zoster) is a painful skin rash, often with blisters. Shingles is caused by the varicella zoster virus, the same virus that causes chickenpox. After you have chickenpox, the virus stays in your body and can cause shingles later in life. You can't catch shingles from another person. However, a person who has never had chickenpox (or chickenpox vaccine) could get chickenpox from someone with shingles. A shingles rash usually appears on one side of the face or body and heals within 2 to 4 weeks. Its main symptom is pain, which can be severe. Other symptoms can include fever, headache, chills and upset stomach. Very rarely, a shingles infection can lead to pneumonia, hearing problems, blindness, brain inflammation (encephalitis), or death. For about 1 person in 5, severe pain can continue even long after the rash has cleared up. This long-lasting pain is called post-herpetic neuralgia (PHN). Shingles is far more common in people 21 years of age and older than in younger people, and the risk increases with age. It is also more common in people whose immune system is weakened because of a disease such as cancer, or by drugs such as steroids or chemotherapy. At least 1 million people a year in the [place] get shingles. 2. Shingles vaccine (recombinant) Recombinant shingles vaccine was approved by FDA in 2017 for the prevention of shingles. In clinical trials, it was more than 90% effective in preventing shingles. It can also reduce the likelihood of PHN. Two doses, 2 to 6 months apart, are recommended for adults 54 and older. This vaccine is also recommended for people who have already gotten the live shingles vaccine (Zostavax). There is no live virus in this vaccine. 3. Some people should not get this vaccine Tell your vaccine  provider if you:  Have any severe, life-threatening allergies. A person who has ever had a life-threatening allergic reaction after a dose of recombinant shingles vaccine, or has a severe allergy to any component of this vaccine, may be advised not to be vaccinated. Ask your health care provider if you want information about vaccine components.  Are pregnant or breastfeeding. There is not much information about use of recombinant shingles vaccine in pregnant or nursing women. Your healthcare provider might recommend delaying vaccination.  Are not feeling well. If you have a mild illness, such as a cold, you can probably get the vaccine today. If you are moderately or severely ill, you should probably wait until you recover. Your doctor can advise you.  4. Risks of a vaccine reaction With any medicine, including vaccines, there is a chance of reactions. After recombinant shingles vaccination, a person might experience:  Pain, redness, soreness, or swelling at the site of the injection  Headache, muscle aches, fever, shivering, fatigue  In clinical trials, most people got a sore arm with mild or moderate pain after vaccination, and some also had redness and swelling where they got the shot. Some people felt tired, had muscle pain, a headache, shivering, fever, stomach pain, or nausea. About 1 out of 6 people who got recombinant zoster vaccine experienced side effects that prevented them from doing regular activities. Symptoms went away on their own in about 2 to 3 days. Side effects were more common in younger people. You should still get the second dose of recombinant zoster vaccine even if you had  one of these reactions after the first dose. Other things that could happen after this vaccine:  People sometimes faint after medical procedures, including vaccination. Sitting or lying down for about 15 minutes can help prevent fainting and injuries caused by a fall. Tell your provider if you feel  dizzy or have vision changes or ringing in the ears.  Some people get shoulder pain that can be more severe and longer-lasting than routine soreness that can follow injections. This happens very rarely.  Any medication can cause a severe allergic reaction. Such reactions to a vaccine are estimated at about 1 in a million doses, and would happen within a few minutes to a few hours after the vaccination. As with any medicine, there is a very remote chance of a vaccine causing a serious injury or death. The safety of vaccines is always being monitored. For more information, visit: http://www.aguilar.org/ 5. What if there is a serious problem? What should I look for?  Look for anything that concerns you, such as signs of a severe allergic reaction, very high fever, or unusual behavior. Signs of a severe allergic reaction can include hives, swelling of the face and throat, difficulty breathing, a fast heartbeat, dizziness, and weakness. These would usually start a few minutes to a few hours after the vaccination. What should I do?  If you think it is a severe allergic reaction or other emergency that can't wait, call 9-1-1 and get to the nearest hospital. Otherwise, call your health care provider. Afterward, the reaction should be reported to the Vaccine Adverse Event Reporting System (VAERS). Your doctor should file this report, or you can do it yourself through the VAERS web site atwww.vaers.https://www.bray.com/ by calling 954-442-7288. VAERS does not give medical advice. 6. How can I learn more?  Ask your healthcare provider. He or she can give you the vaccine package insert or suggest other sources of information.  Call your local or state health department.  Contact the Centers for Disease Control and Prevention (CDC): ? Call (431)842-3593 (1-800-CDC-INFO) or ? Visit the CDC's website at http://hunter.com/ CDC Vaccine Information Statement (VIS) Recombinant Zoster Vaccine (10/28/2016) This  information is not intended to replace advice given to you by your health care provider. Make sure you discuss any questions you have with your health care provider. Document Released: 11/12/2016 Document Revised: 11/12/2016 Document Reviewed: 11/12/2016 Elsevier Interactive Patient Education  [employer].

## 2018-04-10 NOTE — Progress Notes (Signed)
Chief Complaint  Patient presents with  . Transitions Of Care   TOC  1. BP sl elevated today not taking propranolol today yet. Normally BP controlled  2. H/o tubular adenoma 04/16/13 colonoscopy Dr. Tillie Rung [facility] due for f/u also h/o >30 ERCPs and gastric intestinal procedure to re-route intestines due to recurrent stones since GB removed in 1975. S/p gastric bypass [facility] in 2014 and since no further GI stones.  3. C/o left hip pain and low back and at times feels like hip is going to give out or catch since last visit with prior PCP  4. H/o plantar fascitis on mobic prn  5. H/o osteopenia/poris will get prior Dexa Dr. Molli Posey in [place]  Review of Systems  Constitutional: Negative for weight loss.  HENT: Negative for hearing loss.   Eyes: Negative for blurred vision.  Respiratory: Negative for shortness of breath.   Cardiovascular: Positive for palpitations. Negative for chest pain.  Gastrointestinal: Negative for abdominal pain.  Musculoskeletal: Positive for back pain and joint pain.  Skin: Negative for rash.  Neurological: Positive for tremors. Negative for headaches.  Psychiatric/Behavioral: Negative for depression and memory loss.   Past Medical History:  Diagnosis Date  . Anxiety   . Bile duct stricture   . Breast mass, left   . Choledocholithiasis    recurrent  . GERD (gastroesophageal reflux disease)   . Hypertension   . Migraine without aura, without mention of intractable migraine without mention of status migrainosus 01/03/2014  . Rapid heart beat    Past Surgical History:  Procedure Laterality Date  . ABDOMINAL HYSTERECTOMY    . BLADDER SURGERY     x 2  . BREAST EXCISIONAL BIOPSY Right 2003   benign  . BREAST LUMPECTOMY     right  . BREAST LUMPECTOMY WITH RADIOACTIVE SEED LOCALIZATION Left 03/31/2017   Procedure: LEFT BREAST LUMPECTOMY WITH RADIOACTIVE SEED LOCALIZATION;  Surgeon: Coralie Keens, MD;  Location: [facility];  Service: General;   Laterality: Left;  . CHOLECYSTECTOMY    . COLONOSCOPY    . Explor Lap, Hepatoduodenostomy  07/23/11   Dr Lazarus Gowda, [facility]  . FOOT SURGERY     both feet  . GASTRIC BYPASS  05/04/2013  . PARTIAL HYSTERECTOMY    . TUBAL LIGATION    . VEIN LIGATION AND STRIPPING     left leg  . WRIST SURGERY     right   Family History  Problem Relation Age of Onset  . Cancer Mother        lung, smoker  . Alcohol abuse Father   . GER disease Sister   . GER disease Brother   . Depression Sister   . Anxiety disorder Sister   . Hypertension Sister   . Migraines Neg Hx    Social History   Socioeconomic History  . Marital status: Married    Spouse name: Not on file  . Number of children: 2  . Years of education: college  . Highest education level: Not on file  Occupational History  . Occupation: retired    Fish farm manager: RETIRED  Social Needs  . Financial resource strain: Not on file  . Food insecurity:    Worry: Not on file    Inability: Not on file  . Transportation needs:    Medical: Not on file    Non-medical: Not on file  Tobacco Use  . Smoking status: Never Smoker  . Smokeless tobacco: Never Used  Substance and Sexual Activity  .  Alcohol use: No  . Drug use: No  . Sexual activity: Yes  Lifestyle  . Physical activity:    Days per week: Not on file    Minutes per session: Not on file  . Stress: Not on file  Relationships  . Social connections:    Talks on phone: Not on file    Gets together: Not on file    Attends religious service: Not on file    Active member of club or organization: Not on file    Attends meetings of clubs or organizations: Not on file    Relationship status: Not on file  . Intimate partner violence:    Fear of current or ex partner: Not on file    Emotionally abused: Not on file    Physically abused: Not on file    Forced sexual activity: Not on file  Other Topics Concern  . Not on file  Social History Narrative   Does not have a living will or HPOA.    Desires CPR.   Does not want prolonged life support.   Current Meds  Medication Sig  . aspirin 81 MG tablet Take 81 mg by mouth daily.  . Biotin 1 MG CAPS Take 1 mg by mouth daily.   . cyanocobalamin (,VITAMIN B-12,) 1000 MCG/ML injection INJECT 1 ML IN THE MUSCLE EVERY 6 WEEKS AS DIRECTED  . meloxicam (MOBIC) 15 MG tablet TAKE 1 TABLET(15 MG) BY MOUTH DAILY  . Multiple Vitamin (MULTI-VITAMINS) TABS Take 1 tablet by mouth daily.   Marland Kitchen NEXIUM 40 MG capsule TAKE 1 CAPSULE BY MOUTH TWICE DAILY  . propranolol (INDERAL) 40 MG tablet TAKE 1 TABLET BY MOUTH TWICE DAILY  . simvastatin (ZOCOR) 80 MG tablet TAKE 1 TABLET(80 MG) BY MOUTH AT BEDTIME  . ursodiol (ACTIGALL) 250 MG tablet TAKE 1 TABLET(250 MG) BY MOUTH THREE TIMES DAILY  . venlafaxine (EFFEXOR) 37.5 MG tablet TAKE 2 TABLETS BY MOUTH EVERY MORNING AND 1 TABLET EVERY EVENING  . Vitamin D, Ergocalciferol, (DRISDOL) 50000 units CAPS capsule TK ONE C PO Q WEEK FOR 6 WKS   Allergies  Allergen Reactions  . No Known Allergies    No results found for this or any previous visit (from the past 2160 hour(s)). Objective  Body mass index is 21.18 kg/m. Wt Readings from Last 3 Encounters:  04/10/18 123 lb 6.4 oz (56 kg)  06/23/17 129 lb (58.5 kg)  04/28/17 125 lb (56.7 kg)   Temp Readings from Last 3 Encounters:  04/10/18 98.3 F (36.8 C) (Oral)  06/23/17 98.3 F (36.8 C) (Oral)  04/28/17 98.5 F (36.9 C) (Oral)   BP Readings from Last 3 Encounters:  04/10/18 (!) 158/62  06/23/17 112/68  04/28/17 130/70   Pulse Readings from Last 3 Encounters:  04/10/18 (!) 56  06/23/17 68  04/28/17 63    Physical Exam  Constitutional: She is oriented to person, place, and time. She appears well-developed and well-nourished. She is cooperative.  HENT:  Head: Normocephalic and atraumatic.  Mouth/Throat: Oropharynx is clear and moist and mucous membranes are normal.  Eyes: Pupils are equal, round, and reactive to light. Conjunctivae are normal.   Cardiovascular: Normal rate, regular rhythm and normal heart sounds.  Pulmonary/Chest: Effort normal and breath sounds normal.  Musculoskeletal:       Left hip: She exhibits decreased range of motion.       Lumbar back: She exhibits no tenderness.  Neurological: She is alert and oriented to person, place, and time.  Gait normal.  Skin: Skin is warm, dry and intact.  Psychiatric: She has a normal mood and affect. Her speech is normal and behavior is normal. Judgment and thought content normal. Cognition and memory are normal.  Nursing note and vitals reviewed.   Assessment   1. Elevated BP today, monitor/HLD 2. Left hip pain ? Radiculopathy vs degenerative changes  3.H/o tremor 4. H/o NMSC nose  5. HM Plan   1. Check fasting labs resch appt  Cont meds  2. Xray low back and hips today  3. Cont propranolol  H/o tremor worse with anxiety f/u with Dr. Benson [place]  4. F/u derm yearly see below  5.  Check fasting labs  Given Rx shingrix today utd all other vaccines  Tdap due 11/2018  Check MMR status   H/o abnormal mammo and breast bx b/l breast calcifications and 2018 left breast with radioactive see and clip fibrocystic changes, calcifications and apocrine gland metaplasia neg malignancy  -refer [facility] center screening b/l 3D mammo   Pap s/p hysterectomy for prolapse ovaries still intact pap 03/15/15 neg -of note menopause age 41 yo started cycle age 53 y.o   colonoscopy had 04/16/13 tubular adenoma x 1 referred [facility] today Dr. Tillie Rung and f/u other GI issues s/p gastric bypass 2014 see HPI   DEXA get copy prior PCP Dr. Molli Posey  HCV neg 04/20/11   F/u with dermatology yearly Dr. Rolm Bookbinder [place] due h/o NMSC nose  Provider: Dr. Olivia Mackie McLean-Scocuzza-Internal Medicine

## 2018-04-13 ENCOUNTER — Other Ambulatory Visit: Payer: Self-pay | Admitting: Internal Medicine

## 2018-04-13 NOTE — Telephone Encounter (Signed)
Pt given results per Dr McLean-Scocuzza, "Curvature of lower mid and low back Mild to moderate arthritis changes lower back"; the pt would like to know what she should do about this; will route to office for provider review; she also would like a refill sent for nexium to [facility]; additionally the pt has fasting labs on 04/14/18 and hopes that Dr McLean-Scocuzza will have some advice for her; she verbalizes that all of these concerns will be sent to the provider for review; will route to office for provider review; will also refill nexium per protocol.

## 2018-04-13 NOTE — Telephone Encounter (Signed)
Wanting recommendations about curvature in lower mid back

## 2018-04-14 ENCOUNTER — Other Ambulatory Visit (INDEPENDENT_AMBULATORY_CARE_PROVIDER_SITE_OTHER): Payer: Medicare Other

## 2018-04-14 DIAGNOSIS — R002 Palpitations: Secondary | ICD-10-CM

## 2018-04-14 DIAGNOSIS — E785 Hyperlipidemia, unspecified: Secondary | ICD-10-CM

## 2018-04-14 DIAGNOSIS — E538 Deficiency of other specified B group vitamins: Secondary | ICD-10-CM | POA: Diagnosis not present

## 2018-04-14 DIAGNOSIS — E559 Vitamin D deficiency, unspecified: Secondary | ICD-10-CM

## 2018-04-14 DIAGNOSIS — Z0184 Encounter for antibody response examination: Secondary | ICD-10-CM

## 2018-04-14 DIAGNOSIS — Z1389 Encounter for screening for other disorder: Secondary | ICD-10-CM

## 2018-04-14 DIAGNOSIS — Z1329 Encounter for screening for other suspected endocrine disorder: Secondary | ICD-10-CM | POA: Diagnosis not present

## 2018-04-14 DIAGNOSIS — Z1159 Encounter for screening for other viral diseases: Secondary | ICD-10-CM

## 2018-04-14 LAB — COMPREHENSIVE METABOLIC PANEL
ALT: 13 U/L (ref 0–35)
AST: 13 U/L (ref 0–37)
Albumin: 4.2 g/dL (ref 3.5–5.2)
Alkaline Phosphatase: 78 U/L (ref 39–117)
BUN: 19 mg/dL (ref 6–23)
CO2: 29 mEq/L (ref 19–32)
Calcium: 9.5 mg/dL (ref 8.4–10.5)
Chloride: 104 mEq/L (ref 96–112)
Creatinine, Ser: 0.81 mg/dL (ref 0.40–1.20)
GFR: 74.11 mL/min (ref >60.00)
Glucose, Bld: 106 mg/dL — ABNORMAL HIGH (ref 70–99)
Potassium: 5 mEq/L (ref 3.5–5.1)
Sodium: 138 mEq/L (ref 135–145)
Total Bilirubin: 0.5 mg/dL (ref 0.2–1.2)
Total Protein: 6.8 g/dL (ref 6.0–8.3)

## 2018-04-14 LAB — CBC WITH DIFFERENTIAL/PLATELET
Basophils Absolute: 0 10*3/uL (ref 0.0–0.1)
Basophils Relative: 0.9 % (ref 0.0–3.0)
Eosinophils Absolute: 0.2 10*3/uL (ref 0.0–0.7)
Eosinophils Relative: 4.1 % (ref 0.0–5.0)
HCT: 36.6 % (ref 36.0–46.0)
Hemoglobin: 12.4 g/dL (ref 12.0–15.0)
Lymphocytes Relative: 22.2 % (ref 12.0–46.0)
Lymphs Abs: 1 10*3/uL (ref 0.7–4.0)
MCHC: 34 g/dL (ref 30.0–36.0)
MCV: 93.4 fl (ref 78.0–100.0)
Monocytes Absolute: 0.4 10*3/uL (ref 0.1–1.0)
Monocytes Relative: 9.3 % (ref 3.0–12.0)
Neutro Abs: 2.9 10*3/uL (ref 1.4–7.7)
Neutrophils Relative %: 63.5 % (ref 43.0–77.0)
Platelets: 258 10*3/uL (ref 150.0–400.0)
RBC: 3.91 Mil/uL (ref 3.87–5.11)
RDW: 12.5 % (ref 11.5–15.5)
WBC: 4.6 10*3/uL (ref 4.0–10.5)

## 2018-04-14 LAB — LIPID PANEL
CHOLESTEROL: 226 mg/dL — AB (ref 0–200)
HDL: 66.4 mg/dL (ref >39.00)
LDL Cholesterol: 126 mg/dL — ABNORMAL HIGH (ref 0–99)
NONHDL: 160.03
Total CHOL/HDL Ratio: 3
Triglycerides: 172 mg/dL — ABNORMAL HIGH (ref 0.0–149.0)
VLDL: 34.4 mg/dL (ref 0.0–40.0)

## 2018-04-14 LAB — VITAMIN B12: Vitamin B-12: 312 pg/mL (ref 211–911)

## 2018-04-14 LAB — TSH: TSH: 3.35 u[IU]/mL (ref 0.35–4.50)

## 2018-04-14 LAB — VITAMIN D 25 HYDROXY (VIT D DEFICIENCY, FRACTURES): VITD: 31.95 ng/mL (ref 30.00–100.00)

## 2018-04-14 NOTE — Telephone Encounter (Signed)
Pt is out of her [place] 40 MG capsule and needs this refilled at [facility] on [address], (on the corner of [address] and [address]

## 2018-04-14 NOTE — Telephone Encounter (Signed)
Please advise 

## 2018-04-15 ENCOUNTER — Other Ambulatory Visit: Payer: Self-pay | Admitting: Internal Medicine

## 2018-04-15 DIAGNOSIS — K219 Gastro-esophageal reflux disease without esophagitis: Secondary | ICD-10-CM

## 2018-04-15 LAB — MEASLES/MUMPS/RUBELLA IMMUNITY
MUMPS IGG: 64.5 [AU]/ml
RUBELLA: 7.13 {index}
Rubeola IgG: >300 AU/mL

## 2018-04-15 LAB — URINALYSIS, ROUTINE W REFLEX MICROSCOPIC
Bilirubin, UA: NEGATIVE
GLUCOSE, UA: NEGATIVE
KETONES UA: NEGATIVE
Leukocytes, UA: NEGATIVE
NITRITE UA: NEGATIVE
PROTEIN UA: NEGATIVE
RBC, UA: NEGATIVE
SPEC GRAV UA: 1.016 (ref 1.005–1.030)
UUROB: 1 mg/dL (ref 0.2–1.0)
pH, UA: 6.5 (ref 5.0–7.5)

## 2018-04-15 MED ORDER — ESOMEPRAZOLE MAGNESIUM 40 MG PO CPDR: 40.0000 mg | DELAYED_RELEASE_CAPSULE | Freq: Every day | ORAL | 3 refills | Status: DC

## 2018-04-15 NOTE — Telephone Encounter (Signed)
For back these are arthritis changes  We could try PT is she interested ? Or seeing a back specialist? For back pain   Katie [name]

## 2018-05-20 LAB — HM COLONOSCOPY

## 2018-05-21 ENCOUNTER — Encounter: Payer: Self-pay | Admitting: Internal Medicine

## 2018-05-25 ENCOUNTER — Other Ambulatory Visit: Payer: Self-pay | Admitting: Podiatry

## 2018-06-12 ENCOUNTER — Ambulatory Visit
Admission: RE | Admit: 2018-06-12 | Discharge: 2018-06-12 | Disposition: A | Discharge status: Home / Self Care | Payer: Medicare Other | Source: Ambulatory Visit | Attending: Internal Medicine | Admitting: Internal Medicine

## 2018-06-12 DIAGNOSIS — Z1231 Encounter for screening mammogram for malignant neoplasm of breast: Secondary | ICD-10-CM

## 2018-06-15 ENCOUNTER — Other Ambulatory Visit: Payer: Self-pay | Admitting: Podiatry

## 2018-07-05 ENCOUNTER — Other Ambulatory Visit: Payer: Self-pay | Admitting: Family Medicine

## 2018-07-06 ENCOUNTER — Other Ambulatory Visit: Payer: Self-pay | Admitting: Internal Medicine

## 2018-07-06 ENCOUNTER — Telehealth: Payer: Self-pay | Admitting: Internal Medicine

## 2018-07-06 DIAGNOSIS — E785 Hyperlipidemia, unspecified: Secondary | ICD-10-CM

## 2018-07-06 MED ORDER — EZETIMIBE 10 MG PO TABS: 10.0000 mg | ORAL_TABLET | Freq: Every day | ORAL | 3 refills | Status: DC

## 2018-07-06 NOTE — Telephone Encounter (Signed)
Call pt I want to reduce zocor/simvastain 80 mg at night to 40 it is not good to be on 80 mg any longer and I want to add zetia to take with that daily   [place]

## 2018-07-06 NOTE — Telephone Encounter (Signed)
Patient has been informed of the below. She is grabbing medication right now.

## 2018-07-14 ENCOUNTER — Other Ambulatory Visit: Payer: Self-pay | Admitting: Podiatry

## 2018-07-14 ENCOUNTER — Ambulatory Visit: Payer: Medicare Other | Admitting: Internal Medicine

## 2018-07-17 ENCOUNTER — Ambulatory Visit: Payer: Medicare Other | Admitting: Internal Medicine

## 2018-07-21 ENCOUNTER — Other Ambulatory Visit: Payer: Self-pay | Admitting: Family Medicine

## 2018-07-22 ENCOUNTER — Other Ambulatory Visit: Payer: Self-pay | Admitting: Internal Medicine

## 2018-08-26 ENCOUNTER — Ambulatory Visit (INDEPENDENT_AMBULATORY_CARE_PROVIDER_SITE_OTHER): Payer: Medicare Other | Admitting: Internal Medicine

## 2018-08-26 ENCOUNTER — Encounter: Payer: Self-pay | Admitting: Internal Medicine

## 2018-08-26 VITALS — BP 142/64 | HR 64 | Temp 97.9°F | Ht 64.0 in | Wt 122.2 lb

## 2018-08-26 DIAGNOSIS — M25552 Pain in left hip: Secondary | ICD-10-CM

## 2018-08-26 DIAGNOSIS — R739 Hyperglycemia, unspecified: Secondary | ICD-10-CM

## 2018-08-26 DIAGNOSIS — I10 Essential (primary) hypertension: Secondary | ICD-10-CM | POA: Diagnosis not present

## 2018-08-26 DIAGNOSIS — R252 Cramp and spasm: Secondary | ICD-10-CM | POA: Diagnosis not present

## 2018-08-26 DIAGNOSIS — M1990 Unspecified osteoarthritis, unspecified site: Secondary | ICD-10-CM

## 2018-08-26 DIAGNOSIS — E785 Hyperlipidemia, unspecified: Secondary | ICD-10-CM | POA: Diagnosis not present

## 2018-08-26 DIAGNOSIS — M791 Myalgia, unspecified site: Secondary | ICD-10-CM

## 2018-08-26 NOTE — Progress Notes (Signed)
Pre visit review using our clinic review tool, if applicable. No additional management support is needed unless otherwise documented below in the visit note. 

## 2018-08-26 NOTE — Patient Instructions (Addendum)
D3 2000 IU daily  Tdap due 12/09/2018    Consider MRI of low back if left hip continues to bother  Can try Tumeric the spice can be anti inflammatory or glucosamine chrondrotin for arthritis  Tylenol as needed for pain    theraworx can help with leg cramps  Increase water intake  Call back if left is getting worse  Arthritis Arthritis is a term that is commonly used to refer to joint pain or joint disease. There are more than 100 types of arthritis. What are the causes? The most common cause of this condition is wear and tear of a joint. Other causes include:  Gout.  Inflammation of a joint.  An infection of a joint.  Sprains and other injuries near the joint.  A drug reaction or allergic reaction.  In some cases, the cause may not be known. What are the signs or symptoms? The main symptom of this condition is pain in the joint with movement. Other symptoms include:  Redness, swelling, or stiffness at a joint.  Warmth coming from the joint.  Fever.  Overall feeling of illness.  How is this diagnosed? This condition may be diagnosed with a physical exam and tests, including:  Blood tests.  Urine tests.  Imaging tests, such as MRI, X-rays, or a CT scan.  Sometimes, fluid is removed from a joint for testing. How is this treated? Treatment for this condition may involve:  Treatment of the cause, if it is known.  Rest.  Raising (elevating) the joint.  Applying cold or hot packs to the joint.  Medicines to improve symptoms and reduce inflammation.  Injections of a steroid such as cortisone into the joint to help reduce pain and inflammation.  Depending on the cause of your arthritis, you may need to make lifestyle changes to reduce stress on your joint. These changes may include exercising more and losing weight. Follow these instructions at home: Medicines  Take over-the-counter and prescription medicines only as told by your health care provider.  Do  not take aspirin to relieve pain if gout is suspected. Activity  Rest your joint if told by your health care provider. Rest is important when your disease is active and your joint feels painful, swollen, or stiff.  Avoid activities that make the pain worse. It is important to balance activity with rest.  Exercise your joint regularly with range-of-motion exercises as told by your health care provider. Try doing low-impact exercise, such as: ? Swimming. ? Water aerobics. ? Biking. ? Walking. Joint Care   If your joint is swollen, keep it elevated if told by your health care provider.  If your joint feels stiff in the morning, try taking a warm shower.  If directed, apply heat to the joint. If you have diabetes, do not apply heat without permission from your health care provider. ? Put a towel between the joint and the hot pack or heating pad. ? Leave the heat on the area for 20-30 minutes.  If directed, apply ice to the joint: ? Put ice in a plastic bag. ? Place a towel between your skin and the bag. ? Leave the ice on for 20 minutes, 2-3 times per day.  Keep all follow-up visits as told by your health care provider. This is important. Contact a health care provider if:  The pain gets worse.  You have a fever. Get help right away if:  You develop severe joint pain, swelling, or redness.  Many joints become painful  and swollen.  You develop severe back pain.  You develop severe weakness in your leg.  You cannot control your bladder or bowels. This information is not intended to replace advice given to you by your health care provider. Make sure you discuss any questions you have with your health care provider. Document Released: 10/10/2004 Document Revised: 02/08/2016 Document Reviewed: 11/28/2014 Elsevier Interactive Patient Education  2018 [place].   Leg Cramps Leg cramps occur when a muscle or muscles tighten and you have no control over this tightening  (involuntary muscle contraction). Muscle cramps can develop in any muscle, but the most common place is in the calf muscles of the leg. Those cramps can occur during exercise or when you are at rest. Leg cramps are painful, and they may last for a few seconds to a few minutes. Cramps may return several times before they finally stop. Usually, leg cramps are not caused by a serious medical problem. In many cases, the cause is not known. Some common causes include:  Overexertion.  Overuse from repetitive motions, or doing the same thing over and over.  Remaining in a certain position for a long period of time.  Improper preparation, form, or technique while performing a sport or an activity.  Dehydration.  Injury.  Side effects of some medicines.  Abnormally low levels of the salts and ions in your blood (electrolytes), especially potassium and calcium. These levels could be low if you are taking water pills (diuretics) or if you are pregnant.  Follow these instructions at home: Watch your condition for any changes. Taking the following actions may help to lessen any discomfort that you are feeling:  Stay well-hydrated. Drink enough fluid to keep your urine clear or pale yellow.  Try massaging, stretching, and relaxing the affected muscle. Do this for several minutes at a time.  For tight or tense muscles, use a warm towel, heating pad, or hot shower water directed to the affected area.  If you are sore or have pain after a cramp, applying ice to the affected area may relieve discomfort. ? Put ice in a plastic bag. ? Place a towel between your skin and the bag. ? Leave the ice on for 20 minutes, 2-3 times per day.  Avoid strenuous exercise for several days if you have been having frequent leg cramps.  Make sure that your diet includes the essential minerals for your muscles to work normally.  Take medicines only as directed by your health care provider.  Contact a health care  provider if:  Your leg cramps get more severe or more frequent, or they do not improve over time.  Your foot becomes cold, numb, or blue. This information is not intended to replace advice given to you by your health care provider. Make sure you discuss any questions you have with your health care provider. Document Released: 10/10/2004 Document Revised: 02/08/2016 Document Reviewed: 08/10/2014 Elsevier Interactive Patient Education  [employer].

## 2018-08-26 NOTE — Progress Notes (Signed)
Chief Complaint  Patient presents with  . Follow-up   F/u  1. C/o leg cramps at night and wants to know the cause  2. HTN BP  Elevated today did not take propranolol yet  3. C/o left hip pain intermittently 5-6/10 worse at night and unable to lie on that side for long time. Reviewed lumbar Xray 04/10/18 + arthritis changes and b/l hip Xrays 04/10/18 negative. Nothing tried for hip pain  4. HLD uncontrolled tolerating zocor 80 mg qd and zetia 10 mg qd    Review of Systems  Constitutional: Negative for weight loss.  HENT: Negative for hearing loss.   Eyes: Negative for blurred vision.  Respiratory: Negative for shortness of breath.   Cardiovascular: Negative for chest pain.  Gastrointestinal: Negative for abdominal pain.  Musculoskeletal: Positive for joint pain. Negative for back pain.       +leg cramps  +left hip pain   Skin: Negative for rash.  Neurological: Negative for headaches.  Psychiatric/Behavioral: Negative for depression and memory loss.   Past Medical History:  Diagnosis Date  . Anxiety   . Bile duct stricture   . Breast mass, left   . Choledocholithiasis    recurrent  . GERD (gastroesophageal reflux disease)   . Hypertension   . Migraine without aura, without mention of intractable migraine without mention of status migrainosus 01/03/2014  . Rapid heart beat    Past Surgical History:  Procedure Laterality Date  . ABDOMINAL HYSTERECTOMY    . BLADDER SURGERY     x 2  . BREAST EXCISIONAL BIOPSY Right 2003   benign  . BREAST LUMPECTOMY     right  . BREAST LUMPECTOMY WITH RADIOACTIVE SEED LOCALIZATION Left 03/31/2017   Procedure: LEFT BREAST LUMPECTOMY WITH RADIOACTIVE SEED LOCALIZATION;  Surgeon: Coralie Keens, MD;  Location: [facility];  Service: General;  Laterality: Left;  . CHOLECYSTECTOMY    . COLONOSCOPY    . Explor Lap, Hepatoduodenostomy  07/23/11   Dr Lazarus Gowda, [facility]  . FOOT SURGERY     both feet  . GASTRIC BYPASS  05/04/2013  . PARTIAL HYSTERECTOMY    .  TUBAL LIGATION    . VEIN LIGATION AND STRIPPING     left leg  . WRIST SURGERY     right   Family History  Problem Relation Age of Onset  . Cancer Mother        lung, smoker  . Alcohol abuse Father   . GER disease Sister   . GER disease Brother   . Depression Sister   . Anxiety disorder Sister   . Hypertension Sister   . Migraines Neg Hx    Social History   Socioeconomic History  . Marital status: Married    Spouse name: Not on file  . Number of children: 2  . Years of education: college  . Highest education level: Not on file  Occupational History  . Occupation: retired    Fish farm manager: RETIRED  Social Needs  . Financial resource strain: Not on file  . Food insecurity:    Worry: Not on file    Inability: Not on file  . Transportation needs:    Medical: Not on file    Non-medical: Not on file  Tobacco Use  . Smoking status: Never Smoker  . Smokeless tobacco: Never Used  Substance and Sexual Activity  . Alcohol use: No  . Drug use: No  . Sexual activity: Yes  Lifestyle  . Physical activity:    Days per  week: Not on file    Minutes per session: Not on file  . Stress: Not on file  Relationships  . Social connections:    Talks on phone: Not on file    Gets together: Not on file    Attends religious service: Not on file    Active member of club or organization: Not on file    Attends meetings of clubs or organizations: Not on file    Relationship status: Not on file  . Intimate partner violence:    Fear of current or ex partner: Not on file    Emotionally abused: Not on file    Physically abused: Not on file    Forced sexual activity: Not on file  Other Topics Concern  . Not on file  Social History Narrative   Does not have a living will or HPOA.   Desires CPR.   Does not want prolonged life support.   Current Meds  Medication Sig  . aspirin 81 MG tablet Take 81 mg by mouth daily.  . Biotin 1 MG CAPS Take 1 mg by mouth daily.   . CHOLECALCIFEROL PO Take  by mouth.  . cyanocobalamin (,VITAMIN B-12,) 1000 MCG/ML injection Inject 1 mL (1,000 mcg total) into the muscle every 30 (thirty) days.  Marland Kitchen esomeprazole (NEXIUM) 40 MG capsule Take 1 capsule (40 mg total) by mouth daily. 30 minutes before food  . ezetimibe (ZETIA) 10 MG tablet Take 1 tablet (10 mg total) by mouth daily.  . meloxicam (MOBIC) 15 MG tablet TAKE 1 TABLET(15 MG) BY MOUTH DAILY  . Multiple Vitamin (MULTI-VITAMINS) TABS Take 1 tablet by mouth daily.   . propranolol (INDERAL) 40 MG tablet TAKE 1 TABLET BY MOUTH TWICE DAILY  . simvastatin (ZOCOR) 40 MG tablet Take 1 tablet (40 mg total) by mouth daily at 6 PM. Note reduced dose  . ursodiol (ACTIGALL) 250 MG tablet TAKE 1 TABLET(250 MG) BY MOUTH THREE TIMES DAILY  . venlafaxine (EFFEXOR) 37.5 MG tablet TAKE 2 TABLETS BY MOUTH EVERY MORNING AND 1 TABLET EVERY EVENING  . [DISCONTINUED] cyanocobalamin (,VITAMIN B-12,) 1000 MCG/ML injection INJECT 1 ML IN THE MUSCLE EVERY 6 WEEKS AS DIRECTED   Allergies  Allergen Reactions  . Lipitor [Atorvastatin Calcium]     Joint pain   No results found for this or any previous visit (from the past 2160 hour(s)). Objective  Body mass index is 20.98 kg/m. Wt Readings from Last 3 Encounters:  08/26/18 122 lb 3.2 oz (55.4 kg)  04/10/18 123 lb 6.4 oz (56 kg)  06/23/17 129 lb (58.5 kg)   Temp Readings from Last 3 Encounters:  08/26/18 97.9 F (36.6 C) (Oral)  04/10/18 98.3 F (36.8 C) (Oral)  06/23/17 98.3 F (36.8 C) (Oral)   BP Readings from Last 3 Encounters:  08/26/18 (!) 142/64  04/10/18 (!) 158/62  06/23/17 112/68   Pulse Readings from Last 3 Encounters:  08/26/18 64  04/10/18 (!) 56  06/23/17 68    Physical Exam  Constitutional: She is oriented to person, place, and time. Vital signs are normal. She appears well-developed and well-nourished. She is cooperative.  HENT:  Head: Normocephalic and atraumatic.  Mouth/Throat: Oropharynx is clear and moist and mucous membranes are  normal.  Eyes: Pupils are equal, round, and reactive to light. Conjunctivae are normal.  Cardiovascular: Normal rate, regular rhythm and normal heart sounds.  Pulmonary/Chest: Effort normal and breath sounds normal.  Neurological: She is alert and oriented to person, place, and time.  Gait normal.  Skin: Skin is warm, dry and intact.  Psychiatric: She has a normal mood and affect. Her speech is normal and behavior is normal. Judgment and thought content normal. Cognition and memory are normal.  Nursing note and vitals reviewed.   Assessment   1. HTN/HLD 2. Leg cramps  3. Low back arthritis and left hip pain pain from hip could be referred from back other ddx bursitis  4. HM Plan   1. Cont meds  Monitor BP if continues to be elevated on BB consider add ARB in future  2. Disc theraworx otc Check labs r/o statin myopathy  Increase water intake  3. Consider MRI low back, PT and PM&R in future  4.  Check fasting labs  Given Rx shingrix today utd all other vaccines  Flu shot had 06/2018 at work  Tdap due 11/2018 given Rx Tdap due 12/09/2018  Immune MMR  mammo 06/12/18 negative   Pap s/p hysterectomy for prolapse ovaries still intact pap 03/15/15 neg -of note menopause age 7 yo started cycle age 20 y.o   colonoscopy had 04/16/13 tubular adenoma x 1 referred [facility] today Dr. Tillie Rung and f/u other GI issues s/p gastric bypass 2014 see HPI  -colonoscopy 05/20/18 negative polyps no bx f/u in 5 years   DEXA get copy prior PCP Dr. Molli Posey will need to sign release at f/u   HCV neg 04/20/11   F/u with dermatology yearly Dr. Rolm Bookbinder [place] due h/o NMSC nose  -dermatology 10/11 2019 negative tbse  Provider: Dr. Olivia Mackie McLean-Scocuzza-[facility] Medicine

## 2018-09-10 ENCOUNTER — Other Ambulatory Visit (INDEPENDENT_AMBULATORY_CARE_PROVIDER_SITE_OTHER): Payer: Medicare Other

## 2018-09-10 DIAGNOSIS — M791 Myalgia, unspecified site: Secondary | ICD-10-CM

## 2018-09-10 DIAGNOSIS — R252 Cramp and spasm: Secondary | ICD-10-CM

## 2018-09-10 DIAGNOSIS — R739 Hyperglycemia, unspecified: Secondary | ICD-10-CM | POA: Diagnosis not present

## 2018-09-10 DIAGNOSIS — E785 Hyperlipidemia, unspecified: Secondary | ICD-10-CM

## 2018-09-10 LAB — COMPREHENSIVE METABOLIC PANEL
ALK PHOS: 87 U/L (ref 39–117)
ALT: 13 U/L (ref 0–35)
AST: 13 U/L (ref 0–37)
Albumin: 4 g/dL (ref 3.5–5.2)
BILIRUBIN TOTAL: 0.4 mg/dL (ref 0.2–1.2)
BUN: 18 mg/dL (ref 6–23)
CO2: 29 meq/L (ref 19–32)
CREATININE: 0.85 mg/dL (ref 0.40–1.20)
Calcium: 9.1 mg/dL (ref 8.4–10.5)
Chloride: 105 mEq/L (ref 96–112)
GFR: 70.02 mL/min (ref >60.00)
GLUCOSE: 110 mg/dL — AB (ref 70–99)
Potassium: 4.8 mEq/L (ref 3.5–5.1)
SODIUM: 141 meq/L (ref 135–145)
TOTAL PROTEIN: 6.6 g/dL (ref 6.0–8.3)

## 2018-09-10 LAB — LIPID PANEL
Cholesterol: 194 mg/dL (ref 0–200)
HDL: 67.8 mg/dL (ref >39.00)
LDL Cholesterol: 96 mg/dL (ref 0–99)
NONHDL: 125.92
Total CHOL/HDL Ratio: 3
Triglycerides: 150 mg/dL — ABNORMAL HIGH (ref 0.0–149.0)
VLDL: 30 mg/dL (ref 0.0–40.0)

## 2018-09-10 LAB — MAGNESIUM: MAGNESIUM: 2 mg/dL (ref 1.5–2.5)

## 2018-09-10 LAB — CK: CK TOTAL: 71 U/L (ref 7–177)

## 2018-09-10 LAB — HEMOGLOBIN A1C: Hgb A1c MFr Bld: 5.4 % (ref 4.6–6.5)

## 2018-12-30 ENCOUNTER — Ambulatory Visit (INDEPENDENT_AMBULATORY_CARE_PROVIDER_SITE_OTHER): Payer: Medicare Other | Admitting: Internal Medicine

## 2018-12-30 ENCOUNTER — Telehealth: Payer: Self-pay | Admitting: Internal Medicine

## 2018-12-30 DIAGNOSIS — I10 Essential (primary) hypertension: Secondary | ICD-10-CM

## 2018-12-30 DIAGNOSIS — M25552 Pain in left hip: Secondary | ICD-10-CM | POA: Diagnosis not present

## 2018-12-30 DIAGNOSIS — E538 Deficiency of other specified B group vitamins: Secondary | ICD-10-CM | POA: Diagnosis not present

## 2018-12-30 DIAGNOSIS — E785 Hyperlipidemia, unspecified: Secondary | ICD-10-CM

## 2018-12-30 DIAGNOSIS — Z1389 Encounter for screening for other disorder: Secondary | ICD-10-CM

## 2018-12-30 DIAGNOSIS — Z1329 Encounter for screening for other suspected endocrine disorder: Secondary | ICD-10-CM

## 2018-12-30 DIAGNOSIS — Z1231 Encounter for screening mammogram for malignant neoplasm of breast: Secondary | ICD-10-CM

## 2018-12-30 DIAGNOSIS — R252 Cramp and spasm: Secondary | ICD-10-CM

## 2018-12-30 DIAGNOSIS — E559 Vitamin D deficiency, unspecified: Secondary | ICD-10-CM

## 2018-12-30 MED ORDER — CYANOCOBALAMIN 1000 MCG/ML IJ SOLN: 1000.0000 ug | INTRAMUSCULAR | 11 refills | Status: DC

## 2018-12-30 NOTE — Progress Notes (Addendum)
Virtual Visit via Video Note Doxy  I connected with Katie [name]  on 12/30/18 at  2:37 PM EDT by a video enabled telemedicine application and verified that I am speaking with the correct person using two identifiers.  Location patient: home Location provider:work  Persons participating in the virtual visit: patient, provider  I discussed the limitations of evaluation and management by telemedicine and the availability of in person appointments. The patient expressed understanding and agreed to proceed.   HPI: 1. HLD improved on zocor 40 and zetia 10 mg qd leg cramps have also reduced since reducing zocor from 80 mg and she purchased some magnesium orally  2. Blood pressure she has not checked it but no sx's I.e h/a, chest pain, dizziness  3. Hip pain left still having intermittently Xray low back 03/2018 with arthritis changes and scoliosis Xray hips negative but lying on left side hurts at night for a long time she wants to see Dr. Ronnie Derby in [place] orth[facility] in [place] over 4. Reviewed labs 09/10/18  5. She is overall feeling well and still working   ROS: See pertinent positives and negatives per HPI. No h/a, chest pain, dizziness, diarrhea   Past Medical History:  Diagnosis Date  . Anxiety   . Bile duct stricture   . Breast mass, left   . Choledocholithiasis    recurrent  . GERD (gastroesophageal reflux disease)   . Hypertension   . Migraine without aura, without mention of intractable migraine without mention of status migrainosus 01/03/2014  . Rapid heart beat     Past Surgical History:  Procedure Laterality Date  . ABDOMINAL HYSTERECTOMY    . BLADDER SURGERY     x 2  . BREAST EXCISIONAL BIOPSY Right 2003   benign  . BREAST LUMPECTOMY     right  . BREAST LUMPECTOMY WITH RADIOACTIVE SEED LOCALIZATION Left 03/31/2017   Procedure: LEFT BREAST LUMPECTOMY WITH RADIOACTIVE SEED LOCALIZATION;  Surgeon: Coralie Keens, MD;  Location: [facility];  Service: General;  Laterality: Left;  .  CHOLECYSTECTOMY    . COLONOSCOPY    . Explor Lap, Hepatoduodenostomy  07/23/11   Dr Lazarus Gowda, [facility]  . FOOT SURGERY     both feet  . GASTRIC BYPASS  05/04/2013  . PARTIAL HYSTERECTOMY    . TUBAL LIGATION    . VEIN LIGATION AND STRIPPING     left leg  . WRIST SURGERY     right    Family History  Problem Relation Age of Onset  . Cancer Mother        lung, smoker  . Alcohol abuse Father   . GER disease Sister   . GER disease Brother   . Depression Sister   . Anxiety disorder Sister   . Hypertension Sister   . Migraines Neg Hx     SOCIAL HX: lives at home still working clients [employer]. living   Current Outpatient Medications:  .  aspirin 81 MG tablet, Take 81 mg by mouth daily., Disp: , Rfl:  .  Biotin 1 MG CAPS, Take 1 mg by mouth daily. , Disp: , Rfl:  .  CHOLECALCIFEROL PO, Take by mouth., Disp: , Rfl:  .  cyanocobalamin (,VITAMIN B-12,) 1000 MCG/ML injection, Inject 1 mL (1,000 mcg total) into the muscle every 30 (thirty) days., Disp: 4 mL, Rfl: 11 .  esomeprazole (NEXIUM) 40 MG capsule, Take 1 capsule (40 mg total) by mouth daily. 30 minutes before food, Disp: 90 capsule,  Rfl: 3 .  ezetimibe (ZETIA) 10 MG tablet, Take 1 tablet (10 mg total) by mouth daily., Disp: 90 tablet, Rfl: 3 .  meloxicam (MOBIC) 15 MG tablet, TAKE 1 TABLET(15 MG) BY MOUTH DAILY, Disp: 90 tablet, Rfl: 3 .  Multiple Vitamin (MULTI-VITAMINS) TABS, Take 1 tablet by mouth daily. , Disp: , Rfl:  .  propranolol (INDERAL) 40 MG tablet, TAKE 1 TABLET BY MOUTH TWICE DAILY, Disp: 180 tablet, Rfl: 3 .  simvastatin (ZOCOR) 40 MG tablet, Take 1 tablet (40 mg total) by mouth daily at 6 PM. Note reduced dose, Disp: 90 tablet, Rfl: 3 .  ursodiol (ACTIGALL) 250 MG tablet, TAKE 1 TABLET(250 MG) BY MOUTH THREE TIMES DAILY, Disp: 270 tablet, Rfl: 3 .  venlafaxine (EFFEXOR) 37.5 MG tablet, TAKE 2 TABLETS BY MOUTH EVERY MORNING AND 1 TABLET EVERY EVENING, Disp: 270 tablet, Rfl: 3  EXAM:  VITALS  per patient if applicable:  GENERAL: alert, oriented, appears well and in no acute distress  HEENT: atraumatic, conjunttiva clear, no obvious abnormalities on inspection of external nose and ears  NECK: normal movements of the head and neck  LUNGS: on inspection no signs of respiratory distress, breathing rate appears normal, no obvious gross SOB, gasping or wheezing  CV: no obvious cyanosis  MS: moves all visible extremities without noticeable abnormality  PSYCH/NEURO: pleasant and cooperative, no obvious depression or anxiety, speech and thought processing grossly intact  ASSESSMENT AND PLAN:  Discussed the following assessment and plan:  B12 deficiency - Plan: cyanocobalamin (VITAMIN B-12,) 1000 MCG/ML injection montly resent refills   Hyperlipidemia, unspecified hyperlipidemia type cont meds zocor 40 and zetia 10 cholesterol improved  Hypertension, unspecified type -will need to monitor BP at f/u   Left hip pain-pt wll call back when ready to see Dr. Ronnie Derby in [facility] consider MRI hips and low back in future h/o abnormal Xray low back   Leg cramps will take oral magnesium and disc Theraworx today topical mag but cramps better since reduce zocor to 40 from 80   HM Check fasting labs summer 2020   Given Rx shingrix today utd all other vaccines -has not had yet  Flu shot had 06/2018 at work  Tdap due 11/2018 given Rx Tdap due 12/09/2018  Immune MMR  mammo 06/12/18 negative ordered for 2020   Pap s/p hysterectomy for prolapse ovaries still intact pap 03/15/15 neg -of note menopause age 53 yo started cycle age 8 y.o   colonoscopy had 04/16/13 tubular adenoma x 1 referred [facility] today Dr. Tillie Rung and f/u other GI issues s/p gastric bypass 2014 see HPI  -colonoscopy 05/20/18 negative polyps no bx f/u in 5 years   DEXA get copy prior PCP Dr. Molli Posey will need to sign release at f/u   HCV neg 04/20/11   F/u with dermatology yearly Dr. Rolm Bookbinder [place] due h/o  NMSC nose -dermatology 10/11 2019 negative tbse  [facility] appt 06/17/2019 DEXA ordered last scan early osteoporosis     I discussed the assessment and treatment plan with the patient. The patient was provided an opportunity to ask questions and all were answered. The patient agreed with the plan and demonstrated an understanding of the instructions.   The patient was advised to call back or seek an in-person evaluation if the symptoms worsen or if the condition fails to improve as anticipated.  Time spent 15 minutes  Delorise Jackson, MD

## 2018-12-30 NOTE — Telephone Encounter (Signed)
Call [facility] and pt wants to pick up Rx from [place]  Can they transfer?   [place]

## 2018-12-31 NOTE — Telephone Encounter (Signed)
They can but patient has to initiate the transfer.

## 2019-02-15 LAB — HM DEXA SCAN: HM Dexa Scan: ABNORMAL

## 2019-03-05 ENCOUNTER — Other Ambulatory Visit: Payer: Self-pay

## 2019-03-05 ENCOUNTER — Ambulatory Visit
Admission: RE | Admit: 2019-03-05 | Discharge: 2019-03-05 | Disposition: A | Discharge status: Home / Self Care | Payer: Medicare Other | Source: Ambulatory Visit | Attending: Internal Medicine | Admitting: Internal Medicine

## 2019-03-05 DIAGNOSIS — Z1231 Encounter for screening mammogram for malignant neoplasm of breast: Secondary | ICD-10-CM

## 2019-03-23 ENCOUNTER — Other Ambulatory Visit: Payer: Self-pay | Admitting: Internal Medicine

## 2019-03-23 DIAGNOSIS — K219 Gastro-esophageal reflux disease without esophagitis: Secondary | ICD-10-CM

## 2019-03-23 MED ORDER — ESOMEPRAZOLE MAGNESIUM 40 MG PO CPDR: 40.0000 mg | DELAYED_RELEASE_CAPSULE | Freq: Every day | ORAL | 3 refills | Status: DC

## 2019-04-30 ENCOUNTER — Other Ambulatory Visit: Payer: Medicare Other

## 2019-05-07 ENCOUNTER — Other Ambulatory Visit: Payer: Medicare Other

## 2019-05-14 ENCOUNTER — Other Ambulatory Visit: Payer: Medicare Other

## 2019-06-16 DIAGNOSIS — F419 Anxiety disorder, unspecified: Secondary | ICD-10-CM

## 2019-06-16 DIAGNOSIS — R000 Tachycardia, unspecified: Secondary | ICD-10-CM | POA: Insufficient documentation

## 2019-06-16 HISTORY — DX: Anxiety disorder, unspecified: F41.9

## 2019-06-23 ENCOUNTER — Other Ambulatory Visit (INDEPENDENT_AMBULATORY_CARE_PROVIDER_SITE_OTHER): Payer: Medicare Other

## 2019-06-23 ENCOUNTER — Other Ambulatory Visit: Payer: Self-pay

## 2019-06-23 DIAGNOSIS — E559 Vitamin D deficiency, unspecified: Secondary | ICD-10-CM

## 2019-06-23 DIAGNOSIS — E785 Hyperlipidemia, unspecified: Secondary | ICD-10-CM | POA: Diagnosis not present

## 2019-06-23 DIAGNOSIS — E538 Deficiency of other specified B group vitamins: Secondary | ICD-10-CM | POA: Diagnosis not present

## 2019-06-23 DIAGNOSIS — Z1329 Encounter for screening for other suspected endocrine disorder: Secondary | ICD-10-CM | POA: Diagnosis not present

## 2019-06-23 DIAGNOSIS — I10 Essential (primary) hypertension: Secondary | ICD-10-CM

## 2019-06-23 DIAGNOSIS — Z1389 Encounter for screening for other disorder: Secondary | ICD-10-CM

## 2019-06-23 LAB — COMPREHENSIVE METABOLIC PANEL
ALT: 12 U/L (ref 0–35)
AST: 11 U/L (ref 0–37)
Albumin: 4.3 g/dL (ref 3.5–5.2)
Alkaline Phosphatase: 103 U/L (ref 39–117)
BUN: 13 mg/dL (ref 6–23)
CO2: 29 mEq/L (ref 19–32)
Calcium: 9.6 mg/dL (ref 8.4–10.5)
Chloride: 106 mEq/L (ref 96–112)
Creatinine, Ser: 0.78 mg/dL (ref 0.40–1.20)
GFR: 72.59 mL/min (ref >60.00)
Glucose, Bld: 110 mg/dL — ABNORMAL HIGH (ref 70–99)
Potassium: 4.7 mEq/L (ref 3.5–5.1)
Sodium: 144 mEq/L (ref 135–145)
Total Bilirubin: 0.5 mg/dL (ref 0.2–1.2)
Total Protein: 6.5 g/dL (ref 6.0–8.3)

## 2019-06-23 LAB — VITAMIN D 25 HYDROXY (VIT D DEFICIENCY, FRACTURES): VITD: 54.02 ng/mL (ref 30.00–100.00)

## 2019-06-23 LAB — LIPID PANEL
Cholesterol: 202 mg/dL — ABNORMAL HIGH (ref 0–200)
HDL: 67.7 mg/dL (ref >39.00)
LDL Cholesterol: 107 mg/dL — ABNORMAL HIGH (ref 0–99)
NonHDL: 134.65
Total CHOL/HDL Ratio: 3
Triglycerides: 136 mg/dL (ref 0.0–149.0)
VLDL: 27.2 mg/dL (ref 0.0–40.0)

## 2019-06-23 LAB — CBC WITH DIFFERENTIAL/PLATELET
Basophils Absolute: 0 10*3/uL (ref 0.0–0.1)
Basophils Relative: 0.6 % (ref 0.0–3.0)
Eosinophils Absolute: 0.2 10*3/uL (ref 0.0–0.7)
Eosinophils Relative: 3.7 % (ref 0.0–5.0)
HCT: 37.9 % (ref 36.0–46.0)
Hemoglobin: 12.7 g/dL (ref 12.0–15.0)
Lymphocytes Relative: 19.2 % (ref 12.0–46.0)
Lymphs Abs: 0.8 10*3/uL (ref 0.7–4.0)
MCHC: 33.6 g/dL (ref 30.0–36.0)
MCV: 93.5 fl (ref 78.0–100.0)
Monocytes Absolute: 0.4 10*3/uL (ref 0.1–1.0)
Monocytes Relative: 9.3 % (ref 3.0–12.0)
Neutro Abs: 2.9 10*3/uL (ref 1.4–7.7)
Neutrophils Relative %: 67.2 % (ref 43.0–77.0)
Platelets: 285 10*3/uL (ref 150.0–400.0)
RBC: 4.05 Mil/uL (ref 3.87–5.11)
RDW: 12.6 % (ref 11.5–15.5)
WBC: 4.4 10*3/uL (ref 4.0–10.5)

## 2019-06-23 LAB — TSH: TSH: 3.43 u[IU]/mL (ref 0.35–4.50)

## 2019-06-23 LAB — VITAMIN B12: Vitamin B-12: 380 pg/mL (ref 211–911)

## 2019-06-24 ENCOUNTER — Other Ambulatory Visit: Payer: Medicare Other

## 2019-06-24 LAB — URINALYSIS, ROUTINE W REFLEX MICROSCOPIC
Bacteria, UA: NONE SEEN /HPF
Bilirubin Urine: NEGATIVE
Glucose, UA: NEGATIVE
Hgb urine dipstick: NEGATIVE
Hyaline Cast: NONE SEEN /LPF
Ketones, ur: NEGATIVE
Nitrite: NEGATIVE
Protein, ur: NEGATIVE
Specific Gravity, Urine: 1.019 (ref 1.001–1.03)
WBC, UA: NONE SEEN /HPF (ref 0–5)
pH: 6.5 (ref 5.0–8.0)

## 2019-07-21 ENCOUNTER — Other Ambulatory Visit: Payer: Self-pay | Admitting: Internal Medicine

## 2019-07-21 DIAGNOSIS — K8020 Calculus of gallbladder without cholecystitis without obstruction: Secondary | ICD-10-CM

## 2019-07-21 MED ORDER — URSODIOL 250 MG PO TABS: ORAL_TABLET | ORAL | 3 refills | Status: DC

## 2019-08-03 ENCOUNTER — Other Ambulatory Visit: Payer: Self-pay | Admitting: Internal Medicine

## 2019-08-03 DIAGNOSIS — G250 Essential tremor: Secondary | ICD-10-CM

## 2019-08-03 DIAGNOSIS — F339 Major depressive disorder, recurrent, unspecified: Secondary | ICD-10-CM

## 2019-08-03 DIAGNOSIS — E785 Hyperlipidemia, unspecified: Secondary | ICD-10-CM

## 2019-08-03 MED ORDER — PROPRANOLOL HCL 40 MG PO TABS: 40.0000 mg | ORAL_TABLET | Freq: Two times a day (BID) | ORAL | 3 refills | Status: DC

## 2019-08-03 MED ORDER — VENLAFAXINE HCL 37.5 MG PO TABS: ORAL_TABLET | ORAL | 3 refills | Status: DC

## 2019-08-03 MED ORDER — SIMVASTATIN 40 MG PO TABS: 40.0000 mg | ORAL_TABLET | Freq: Every day | ORAL | 3 refills | Status: DC

## 2019-08-03 MED ORDER — EZETIMIBE 10 MG PO TABS: 10.0000 mg | ORAL_TABLET | Freq: Every day | ORAL | 3 refills | Status: DC

## 2019-09-21 DIAGNOSIS — M25532 Pain in left wrist: Secondary | ICD-10-CM | POA: Diagnosis not present

## 2019-11-24 ENCOUNTER — Telehealth: Payer: Self-pay | Admitting: Internal Medicine

## 2019-11-24 ENCOUNTER — Encounter: Payer: Self-pay | Admitting: Internal Medicine

## 2019-11-24 ENCOUNTER — Other Ambulatory Visit: Payer: Self-pay

## 2019-11-24 ENCOUNTER — Ambulatory Visit (INDEPENDENT_AMBULATORY_CARE_PROVIDER_SITE_OTHER): Payer: Medicare PPO | Admitting: Internal Medicine

## 2019-11-24 VITALS — BP 116/70 | HR 61 | Temp 97.4°F | Ht 64.0 in | Wt 116.8 lb

## 2019-11-24 DIAGNOSIS — K8050 Calculus of bile duct without cholangitis or cholecystitis without obstruction: Secondary | ICD-10-CM | POA: Diagnosis not present

## 2019-11-24 DIAGNOSIS — R1011 Right upper quadrant pain: Secondary | ICD-10-CM

## 2019-11-24 DIAGNOSIS — Z1231 Encounter for screening mammogram for malignant neoplasm of breast: Secondary | ICD-10-CM

## 2019-11-24 DIAGNOSIS — E538 Deficiency of other specified B group vitamins: Secondary | ICD-10-CM | POA: Diagnosis not present

## 2019-11-24 DIAGNOSIS — R110 Nausea: Secondary | ICD-10-CM

## 2019-11-24 DIAGNOSIS — Z20828 Contact with and (suspected) exposure to other viral communicable diseases: Secondary | ICD-10-CM | POA: Diagnosis not present

## 2019-11-24 LAB — CBC WITH DIFFERENTIAL/PLATELET
Basophils Absolute: 0 10*3/uL (ref 0.0–0.1)
Basophils Relative: 0.4 % (ref 0.0–3.0)
Eosinophils Absolute: 0.1 10*3/uL (ref 0.0–0.7)
Eosinophils Relative: 2.3 % (ref 0.0–5.0)
HCT: 40.4 % (ref 36.0–46.0)
Hemoglobin: 13.5 g/dL (ref 12.0–15.0)
Lymphocytes Relative: 17.3 % (ref 12.0–46.0)
Lymphs Abs: 0.8 10*3/uL (ref 0.7–4.0)
MCHC: 33.5 g/dL (ref 30.0–36.0)
MCV: 93.3 fl (ref 78.0–100.0)
Monocytes Absolute: 0.6 10*3/uL (ref 0.1–1.0)
Monocytes Relative: 13.9 % — ABNORMAL HIGH (ref 3.0–12.0)
Neutro Abs: 3 10*3/uL (ref 1.4–7.7)
Neutrophils Relative %: 66.1 % (ref 43.0–77.0)
Platelets: 246 10*3/uL (ref 150.0–400.0)
RBC: 4.33 Mil/uL (ref 3.87–5.11)
RDW: 12.8 % (ref 11.5–15.5)
WBC: 4.6 10*3/uL (ref 4.0–10.5)

## 2019-11-24 LAB — COMPREHENSIVE METABOLIC PANEL
ALT: 23 U/L (ref 0–35)
AST: 22 U/L (ref 0–37)
Albumin: 4.1 g/dL (ref 3.5–5.2)
Alkaline Phosphatase: 92 U/L (ref 39–117)
BUN: 13 mg/dL (ref 6–23)
CO2: 29 mEq/L (ref 19–32)
Calcium: 9.7 mg/dL (ref 8.4–10.5)
Chloride: 107 mEq/L (ref 96–112)
Creatinine, Ser: 0.94 mg/dL (ref 0.40–1.20)
GFR: 58.46 mL/min — ABNORMAL LOW (ref >60.00)
Glucose, Bld: 89 mg/dL (ref 70–99)
Potassium: 4.6 mEq/L (ref 3.5–5.1)
Sodium: 143 mEq/L (ref 135–145)
Total Bilirubin: 0.5 mg/dL (ref 0.2–1.2)
Total Protein: 7.1 g/dL (ref 6.0–8.3)

## 2019-11-24 MED ORDER — CYANOCOBALAMIN 1000 MCG/ML IJ SOLN: 1000.0000 ug | INTRAMUSCULAR | 11 refills | Status: DC

## 2019-11-24 MED ORDER — ONDANSETRON HCL 4 MG PO TABS: 4.0000 mg | ORAL_TABLET | Freq: Three times a day (TID) | ORAL | 2 refills | Status: DC | PRN

## 2019-11-24 NOTE — Telephone Encounter (Signed)
-----   Message from Delorise Jackson, MD sent at 11/24/2019  2:29 PM EST ----- 1. Fax note to 2 doctors below [facility] [place], [place] [address] [phone]  Vernie Ammons, MD [address] [place], [place] [address] [phone] [facility])  [place] [facility] [place], [place] [address] [phone]  Lacy Duverney, MD [facility] [place], [place] [address] [phone] (Fax)  Abdominal pain, RUQ (right upper quadrant); Nausea

## 2019-11-24 NOTE — Telephone Encounter (Signed)
Faxed to both offices.

## 2019-11-24 NOTE — Progress Notes (Signed)
Chief Complaint  Patient presents with  . Annual Exam   F/u  1. X 2 months c/o nausea with RUQ ab pain 7/10 nagging ongoing x 2 months tried ibuprofen h/o bile duct stones. She is down 6 lbs but thinks 2/2 working hard. Pain feels sore and rad to mid back and stool at times orange  She previously had surgery Dr. Lazarus Gowda and Dr. Tillie Rung [facility]    Review of Systems  Constitutional: Positive for weight loss. Negative for chills and fever.  HENT: Negative for hearing loss.   Eyes: Negative for blurred vision.  Respiratory: Negative for shortness of breath.   Cardiovascular: Negative for chest pain.  Gastrointestinal: Positive for abdominal pain and nausea. Negative for vomiting.  Skin: Negative.    Past Medical History:  Diagnosis Date  . Anxiety   . Bile duct stricture   . Breast mass, left   . Choledocholithiasis    recurrent  . GERD (gastroesophageal reflux disease)   . Hypertension   . Migraine without aura, without mention of intractable migraine without mention of status migrainosus 01/03/2014  . Rapid heart beat    Past Surgical History:  Procedure Laterality Date  . ABDOMINAL HYSTERECTOMY    . BLADDER SURGERY     x 2  . BREAST EXCISIONAL BIOPSY Right 2003   benign  . BREAST LUMPECTOMY     right  . BREAST LUMPECTOMY WITH RADIOACTIVE SEED LOCALIZATION Left 03/31/2017   Procedure: LEFT BREAST LUMPECTOMY WITH RADIOACTIVE SEED LOCALIZATION;  Surgeon: Coralie Keens, MD;  Location: [facility];  Service: General;  Laterality: Left;  . CHOLECYSTECTOMY    . COLONOSCOPY    . Explor Lap, Hepatoduodenostomy  07/23/11   Dr Lazarus Gowda, [facility]  . FOOT SURGERY     both feet  . GASTRIC BYPASS  05/04/2013  . PARTIAL HYSTERECTOMY    . TUBAL LIGATION    . VEIN LIGATION AND STRIPPING     left leg  . WRIST SURGERY     right   Family History  Problem Relation Age of Onset  . Cancer Mother        lung, smoker  . Alcohol abuse Father   . GER disease Sister   . GER disease Brother    . Depression Sister   . Anxiety disorder Sister   . Hypertension Sister   . Migraines Neg Hx    Social History   Socioeconomic History  . Marital status: Married    Spouse name: Not on file  . Number of children: 2  . Years of education: college  . Highest education level: Not on file  Occupational History  . Occupation: retired    Fish farm manager: RETIRED  Tobacco Use  . Smoking status: Never Smoker  . Smokeless tobacco: Never Used  Substance and Sexual Activity  . Alcohol use: No  . Drug use: No  . Sexual activity: Yes  Other Topics Concern  . Not on file  Social History Narrative   Does not have a living will or HPOA.   Desires CPR.   Does not want prolonged life support.   Social Determinants of Health   Financial Resource Strain:   . Difficulty of Paying Living Expenses: Not on file  Food Insecurity:   . Worried About Charity fundraiser in the Last Year: Not on file  . Ran Out of Food in the Last Year: Not on file  Transportation Needs:   . Lack of Transportation (Medical): Not on file  .  Lack of Transportation (Non-Medical): Not on file  Physical Activity:   . Days of Exercise per Week: Not on file  . Minutes of Exercise per Session: Not on file  Stress:   . Feeling of Stress : Not on file  Social Connections:   . Frequency of Communication with Friends and Family: Not on file  . Frequency of Social Gatherings with Friends and Family: Not on file  . Attends Religious Services: Not on file  . Active Member of Clubs or Organizations: Not on file  . Attends Archivist Meetings: Not on file  . Marital Status: Not on file  Intimate Partner Violence:   . Fear of Current or Ex-Partner: Not on file  . Emotionally Abused: Not on file  . Physically Abused: Not on file  . Sexually Abused: Not on file   Current Meds  Medication Sig  . aspirin 81 MG tablet Take 81 mg by mouth daily.  . Biotin 1 MG CAPS Take 1 mg by mouth daily.   . CHOLECALCIFEROL PO Take  by mouth.  . cyanocobalamin (,VITAMIN B-12,) 1000 MCG/ML injection Inject 1 mL (1,000 mcg total) into the muscle every 30 (thirty) days.  Marland Kitchen ezetimibe (ZETIA) 10 MG tablet Take 1 tablet (10 mg total) by mouth daily.  . Multiple Vitamin (MULTI-VITAMINS) TABS Take 1 tablet by mouth daily.   Marland Kitchen OMEPRAZOLE PO Take by mouth.  . propranolol (INDERAL) 40 MG tablet Take 1 tablet (40 mg total) by mouth 2 (two) times daily.  . simvastatin (ZOCOR) 40 MG tablet Take 1 tablet (40 mg total) by mouth daily at 6 PM. Note reduced dose  . ursodiol (ACTIGALL) 250 MG tablet TAKE 1 TABLET(250 MG) BY MOUTH THREE TIMES DAILY  . venlafaxine (EFFEXOR) 37.5 MG tablet TAKE 2 TABLETS BY MOUTH EVERY MORNING AND 1 TABLET EVERY EVENING  . [DISCONTINUED] cyanocobalamin (,VITAMIN B-12,) 1000 MCG/ML injection Inject 1 mL (1,000 mcg total) into the muscle every 30 (thirty) days.   Allergies  Allergen Reactions  . Lipitor [Atorvastatin Calcium]     Joint pain   No results found for this or any previous visit (from the past 2160 hour(s)). Objective  Body mass index is 20.05 kg/m. Wt Readings from Last 3 Encounters:  11/24/19 116 lb 12.8 oz (53 kg)  08/26/18 122 lb 3.2 oz (55.4 kg)  04/10/18 123 lb 6.4 oz (56 kg)   Temp Readings from Last 3 Encounters:  11/24/19 (!) 97.4 F (36.3 C) (Temporal)  08/26/18 97.9 F (36.6 C) (Oral)  04/10/18 98.3 F (36.8 C) (Oral)   BP Readings from Last 3 Encounters:  11/24/19 116/70  08/26/18 (!) 142/64  04/10/18 (!) 158/62   Pulse Readings from Last 3 Encounters:  11/24/19 61  08/26/18 64  04/10/18 (!) 56    Physical Exam Vitals and nursing note reviewed.  Constitutional:      Appearance: Normal appearance. She is well-developed and well-groomed.  HENT:     Head: Normocephalic and atraumatic.  Eyes:     Conjunctiva/sclera: Conjunctivae normal.     Pupils: Pupils are equal, round, and reactive to light.  Cardiovascular:     Rate and Rhythm: Normal rate and regular  rhythm.     Heart sounds: Normal heart sounds. No murmur.  Pulmonary:     Effort: Pulmonary effort is normal.     Breath sounds: Normal breath sounds.  Chest:     Chest wall: No mass.     Breasts: Breasts are symmetrical.  Right: Normal. No swelling, bleeding, inverted nipple, mass, nipple discharge, skin change or tenderness.        Left: Normal. No swelling, bleeding, inverted nipple, mass, nipple discharge, skin change or tenderness.  Lymphadenopathy:     Upper Body:     Right upper body: No axillary adenopathy.     Left upper body: No axillary adenopathy.  Skin:    General: Skin is warm and dry.  Neurological:     General: No focal deficit present.     Mental Status: She is alert and oriented to person, place, and time. Mental status is at baseline.     Gait: Gait normal.  Psychiatric:        Attention and Perception: Attention and perception normal.        Mood and Affect: Mood and affect normal.        Speech: Speech normal.        Behavior: Behavior normal. Behavior is cooperative.        Thought Content: Thought content normal.        Cognition and Memory: Cognition and memory normal.        Judgment: Judgment normal.     Assessment  Plan  Right upper quadrant abdominal pain - Plan: Comprehensive metabolic panel, CBC with Differential/Platelet, Urinalysis, Routine w reflex microscopic, ondansetron (ZOFRAN) 4 MG tablet, US Abdomen Complete  Choledocholithiasis - Plan: Comprehensive metabolic panel, CBC with Differential/Platelet, Urinalysis, Routine w reflex microscopic, ondansetron (ZOFRAN) 4 MG tablet, US Abdomen Complete, CANCELED: US Abdomen Complete  Nausea - Plan: ondansetron (ZOFRAN) 4 MG tablet, US Abdomen Complete, CANCELED: US Abdomen Complete  B12 deficiency - Plan: cyanocobalamin (,VITAMIN B-12,) 1000 MCG/ML injection   HM Given Rx shingrix today utd all other vaccines-has not had yet  Flu shot utd Tdap due 3/2020given Rx Tdap due  12/09/2018 Immune MMR moderna 2/2   mammo due 02/2020 prev neg   Pap s/p hysterectomy for prolapse ovaries still intact pap 03/15/15 neg -of note menopause age 41 yo started cycle age 66 y.o   colonoscopy had 04/16/13 tubular adenoma x 1 referred [facility] today Dr. Tillie Rung and f/u other GI issues s/p gastric bypass 2014 see HPI -colonoscopy 05/20/18 negative polyps no bx f/u in 5 years  DEXA get copy prior PCP Dr. Vernard Gambles need to sign release at f/u  HCV neg 04/20/11   F/u with dermatology yearly Dr. Rolm Bookbinder [place] due h/o NMSC nose -dermatology 10/11 2019 negative tbse, seen 2020   [facility] OB/GYN appt 06/17/2019 DEXA ordered last scan early osteoporosis   Provider: Dr. Olivia Mackie McLean-Scocuzza-Internal Medicine

## 2019-11-25 LAB — URINALYSIS, ROUTINE W REFLEX MICROSCOPIC
Bacteria, UA: NONE SEEN /HPF
Bilirubin Urine: NEGATIVE
Glucose, UA: NEGATIVE
Hgb urine dipstick: NEGATIVE
Ketones, ur: NEGATIVE
Nitrite: NEGATIVE
Protein, ur: NEGATIVE
Specific Gravity, Urine: 1.016 (ref 1.001–1.03)
pH: 6 (ref 5.0–8.0)

## 2019-11-29 ENCOUNTER — Ambulatory Visit
Admission: RE | Admit: 2019-11-29 | Discharge: 2019-11-29 | Disposition: A | Discharge status: Home / Self Care | Payer: Medicare PPO | Source: Ambulatory Visit | Attending: Internal Medicine | Admitting: Internal Medicine

## 2019-11-29 ENCOUNTER — Other Ambulatory Visit: Payer: Self-pay

## 2019-11-29 DIAGNOSIS — K8050 Calculus of bile duct without cholangitis or cholecystitis without obstruction: Secondary | ICD-10-CM

## 2019-11-29 DIAGNOSIS — R1011 Right upper quadrant pain: Secondary | ICD-10-CM | POA: Diagnosis not present

## 2019-11-29 DIAGNOSIS — R110 Nausea: Secondary | ICD-10-CM | POA: Insufficient documentation

## 2019-11-30 NOTE — Addendum Note (Signed)
Addended by: Orland Mustard on: 11/30/2019 08:03 AM   Modules accepted: Orders

## 2020-01-21 ENCOUNTER — Other Ambulatory Visit: Payer: Self-pay | Admitting: Internal Medicine

## 2020-01-21 DIAGNOSIS — F339 Major depressive disorder, recurrent, unspecified: Secondary | ICD-10-CM

## 2020-01-21 MED ORDER — VENLAFAXINE HCL 37.5 MG PO TABS: ORAL_TABLET | ORAL | 3 refills | Status: DC

## 2020-01-27 DIAGNOSIS — R1011 Right upper quadrant pain: Secondary | ICD-10-CM | POA: Diagnosis not present

## 2020-01-27 DIAGNOSIS — Z980 Intestinal bypass and anastomosis status: Secondary | ICD-10-CM | POA: Diagnosis not present

## 2020-03-06 ENCOUNTER — Other Ambulatory Visit: Payer: Self-pay

## 2020-03-06 ENCOUNTER — Ambulatory Visit
Admission: RE | Admit: 2020-03-06 | Discharge: 2020-03-06 | Disposition: A | Discharge status: Home / Self Care | Payer: Medicare PPO | Source: Ambulatory Visit | Attending: Internal Medicine | Admitting: Internal Medicine

## 2020-03-06 DIAGNOSIS — Z1231 Encounter for screening mammogram for malignant neoplasm of breast: Secondary | ICD-10-CM

## 2020-03-10 ENCOUNTER — Telehealth: Payer: Self-pay | Admitting: Internal Medicine

## 2020-03-17 ENCOUNTER — Other Ambulatory Visit: Payer: Self-pay | Admitting: Internal Medicine

## 2020-03-17 DIAGNOSIS — K219 Gastro-esophageal reflux disease without esophagitis: Secondary | ICD-10-CM

## 2020-03-17 MED ORDER — ESOMEPRAZOLE MAGNESIUM 40 MG PO CPDR: 40.0000 mg | DELAYED_RELEASE_CAPSULE | Freq: Every day | ORAL | 3 refills | Status: DC

## 2020-04-09 ENCOUNTER — Other Ambulatory Visit: Payer: Self-pay | Admitting: Internal Medicine

## 2020-04-09 DIAGNOSIS — R110 Nausea: Secondary | ICD-10-CM

## 2020-04-09 DIAGNOSIS — K8050 Calculus of bile duct without cholangitis or cholecystitis without obstruction: Secondary | ICD-10-CM

## 2020-04-09 DIAGNOSIS — R1011 Right upper quadrant pain: Secondary | ICD-10-CM

## 2020-04-09 MED ORDER — ONDANSETRON HCL 4 MG PO TABS: 4.0000 mg | ORAL_TABLET | Freq: Three times a day (TID) | ORAL | 5 refills | Status: DC | PRN

## 2020-04-15 DIAGNOSIS — J069 Acute upper respiratory infection, unspecified: Secondary | ICD-10-CM | POA: Diagnosis not present

## 2020-05-05 ENCOUNTER — Ambulatory Visit (INDEPENDENT_AMBULATORY_CARE_PROVIDER_SITE_OTHER): Payer: Medicare PPO

## 2020-05-05 VITALS — Ht 64.0 in | Wt 116.0 lb

## 2020-05-05 DIAGNOSIS — Z0000 Encounter for general adult medical examination without abnormal findings: Secondary | ICD-10-CM

## 2020-05-05 NOTE — Progress Notes (Signed)
Subjective:   Katie [name] is a 74 y.o. female who presents for Medicare Annual (Subsequent) preventive examination.  Review of Systems    No ROS.  Medicare Wellness Virtual Visit.   Cardiac Risk Factors include: advanced age (>76men, >32 women);hypertension     Objective:    Today's Vitals   05/05/20 0938  Weight: 116 lb (52.6 kg)  Height: 5\' 4"  (1.626 m)   Body mass index is 19.91 kg/m.  Advanced Directives 05/05/2020 03/31/2017 04/19/2016 02/03/2015  Does Patient Have a Medical Advance Directive? (No Data) No No No  Does patient want to make changes to medical advance directive? No - Patient declined - - -  Would patient like information on creating a medical advance directive? - No - Patient declined Yes - Educational materials given Yes - Educational materials given    Current Medications (verified) Outpatient Encounter Medications as of 05/05/2020  Medication Sig  . aspirin 81 MG tablet Take 81 mg by mouth daily.  . Biotin 1 MG CAPS Take 1 mg by mouth daily.   . CHOLECALCIFEROL PO Take by mouth.  . cyanocobalamin (,VITAMIN B-12,) 1000 MCG/ML injection Inject 1 mL (1,000 mcg total) into the muscle every 30 (thirty) days.  Marland Kitchen esomeprazole (NEXIUM) 40 MG capsule Take 1 capsule (40 mg total) by mouth daily. 30 minutes before food  . ezetimibe (ZETIA) 10 MG tablet Take 1 tablet (10 mg total) by mouth daily.  . meloxicam (MOBIC) 15 MG tablet TAKE 1 TABLET(15 MG) BY MOUTH DAILY (Patient not taking: Reported on 11/24/2019)  . Multiple Vitamin (MULTI-VITAMINS) TABS Take 1 tablet by mouth daily.   . ondansetron (ZOFRAN) 4 MG tablet Take 1 tablet (4 mg total) by mouth every 8 (eight) hours as needed for nausea or vomiting.  . propranolol (INDERAL) 40 MG tablet Take 1 tablet (40 mg total) by mouth 2 (two) times daily.  . simvastatin (ZOCOR) 40 MG tablet Take 1 tablet (40 mg total) by mouth daily at 6 PM. Note reduced dose  . ursodiol (ACTIGALL) 250 MG tablet TAKE 1 TABLET(250 MG) BY  MOUTH THREE TIMES DAILY  . venlafaxine (EFFEXOR) 37.5 MG tablet TAKE 2 TABLETS BY MOUTH EVERY MORNING AND 1 TABLET EVERY EVENING   No facility-administered encounter medications on file as of 05/05/2020.    Allergies (verified) Lipitor [atorvastatin calcium]   History: Past Medical History:  Diagnosis Date  . Anxiety   . Bile duct stricture   . Breast mass, left   . Choledocholithiasis    recurrent  . GERD (gastroesophageal reflux disease)   . Hypertension   . Migraine without aura, without mention of intractable migraine without mention of status migrainosus 01/03/2014  . Rapid heart beat    Past Surgical History:  Procedure Laterality Date  . ABDOMINAL HYSTERECTOMY    . BLADDER SURGERY     x 2  . BREAST EXCISIONAL BIOPSY Right 2003   benign  . BREAST LUMPECTOMY     right  . BREAST LUMPECTOMY WITH RADIOACTIVE SEED LOCALIZATION Left 03/31/2017   Procedure: LEFT BREAST LUMPECTOMY WITH RADIOACTIVE SEED LOCALIZATION;  Surgeon: Coralie Keens, MD;  Location: [facility];  Service: General;  Laterality: Left;  . CHOLECYSTECTOMY    . COLONOSCOPY    . Explor Lap, Hepatoduodenostomy  07/23/11   Dr Lazarus Gowda, T[facility] Sleeper  . FOOT SURGERY     both feet  . GASTRIC BYPASS  05/04/2013  . PARTIAL HYSTERECTOMY    . TUBAL LIGATION    . VEIN LIGATION AND  STRIPPING     left leg  . WRIST SURGERY     right   Family History  Problem Relation Age of Onset  . Cancer Mother        lung, smoker  . Alcohol abuse Father   . GER disease Sister   . GER disease Brother   . Depression Sister   . Anxiety disorder Sister   . Hypertension Sister   . Migraines Neg Hx    Social History   Socioeconomic History  . Marital status: Married    Spouse name: Not on file  . Number of children: 2  . Years of education: college  . Highest education level: Not on file  Occupational History  . Occupation: retired    Fish farm manager: RETIRED  Tobacco Use  . Smoking status: Never Smoker  . Smokeless tobacco: Never Used    Vaping Use  . Vaping Use: Never used  Substance and Sexual Activity  . Alcohol use: No  . Drug use: No  . Sexual activity: Yes  Other Topics Concern  . Not on file  Social History Narrative   Does not have a living will or HPOA.   Desires CPR.   Does not want prolonged life support.   Social Determinants of Health   Financial Resource Strain: Low Risk   . Difficulty of Paying Living Expenses: Not hard at all  Food Insecurity: No Food Insecurity  . Worried About Charity fundraiser in the Last Year: Never true  . Ran Out of Food in the Last Year: Never true  Transportation Needs: No Transportation Needs  . Lack of Transportation (Medical): No  . Lack of Transportation (Non-Medical): No  Physical Activity:   . Days of Exercise per Week: Not on file  . Minutes of Exercise per Session: Not on file  Stress: No Stress Concern Present  . Feeling of Stress : Not at all  Social Connections: Unknown  . Frequency of Communication with Friends and Family: More than three times a week  . Frequency of Social Gatherings with Friends and Family: More than three times a week  . Attends Religious Services: Not on file  . Active Member of Clubs or Organizations: Not on file  . Attends Archivist Meetings: Not on file  . Marital Status: Not on file    Tobacco Counseling Counseling given: Not Answered   Clinical Intake:  Pre-visit preparation completed: Yes        Diabetes: No  How often do you need to have someone help you when you read instructions, pamphlets, or other written materials from your doctor or pharmacy?: 1 - Never Interpreter Needed?: No      Activities of Daily Living In your present state of health, do you have any difficulty performing the following activities: 05/05/2020  Hearing? Y  Vision? N  Difficulty concentrating or making decisions? N  Walking or climbing stairs? N  Dressing or bathing? N  Doing errands, shopping? N  Preparing Food and  eating ? N  Using the Toilet? N  In the past six months, have you accidently leaked urine? N  Do you have problems with loss of bowel control? N  Managing your Medications? N  Managing your Finances? N  Housekeeping or managing your Housekeeping? N  Some recent data might be hidden    Patient Care Team: McLean-Scocuzza, Nino Glow, MD as PCP - General (Internal Medicine) Kathrynn Ducking, MD as Consulting Physician (Neurology) Rolm Bookbinder, MD as  Consulting Physician (Dermatology) Molli Posey, MD as Consulting Physician (Obstetrics and Gynecology) Thelma Comp, OD as Consulting Physician (Optometry)  Indicate any recent Medical Services you may have received from other than [facility] providers in the past year (date may be approximate).     Assessment:   This is a routine wellness examination for Katie [name].  I connected with Camya today by telephone and verified that I am speaking with the correct person using two identifiers. Location patient: home Location provider: work Persons participating in the virtual visit: patient, Marine scientist.    I discussed the limitations, risks, security and privacy concerns of performing an evaluation and management service by telephone and the availability of in person appointments. The patient expressed understanding and verbally consented to this telephonic visit.    Interactive audio and video telecommunications were attempted between this provider and patient, however failed, due to patient having technical difficulties OR patient did not have access to video capability.  We continued and completed visit with audio only.  Some vital signs may be absent or patient reported.   Hearing/Vision screen  Hearing Screening   125Hz  250Hz  500Hz  1000Hz  2000Hz  3000Hz  4000Hz  6000Hz  8000Hz   Right ear:           Left ear:           Comments: Total hearing loss in left ear  Vision Screening Comments: Followed by [facility] Wears corrective  lenses Visual acuity not assessed, virtual visit.  They have seen their ophthalmologist in the last 12 months.     Dietary issues and exercise activities discussed: Current Exercise Habits: Home exercise routine, Type of exercise: walking, Intensity: Mild  Healthy diet Good water intake  Goals    . Follow up with Primary Care Provider     As needed      Depression Screen PHQ 2/9 Scores 05/05/2020 11/24/2019 04/10/2018 04/19/2016 08/24/2014 06/17/2013  PHQ - 2 Score 0 0 0 0 0 0    Fall Risk Fall Risk  05/05/2020 11/24/2019 04/10/2018 02/21/2017 04/19/2016  Falls in the past year? 0 0 No No No  Number falls in past yr: 0 0 - - -  Injury with Fall? - 0 - - -  Follow up Falls evaluation completed Falls evaluation completed - - -   Handrails in use when climbing stairs? Yes Home free of loose throw rugs in walkways, pet beds, electrical cords, etc? Yes  Adequate lighting in your home to reduce risk of falls? Yes   ASSISTIVE DEVICES UTILIZED TO PREVENT FALLS:  Use of a cane, walker or w/c? No  Grab bars in the bathroom? Yes  Shower chair or bench in shower? Yes  Elevated toilet seat or a handicapped toilet? Yes   TIMED UP AND GO:  Was the test performed? No .    Cognitive Function: MMSE - Mini Mental State Exam 04/19/2016  Orientation to time 5  Orientation to Place 5  Registration 3  Attention/ Calculation 0  Recall 3  Language- name 2 objects 0  Language- repeat 1  Language- follow 3 step command 3  Language- read & follow direction 0  Write a sentence 0  Copy design 0  Total score 20     6CIT Screen 05/05/2020  What Year? 0 points  What month? 0 points  What time? 0 points    Immunizations Immunization History  Administered Date(s) Administered  . Influenza,inj,Quad PF,6+ Mos 08/02/2015  . Influenza-Unspecified 06/24/2014, 06/17/2019  . Moderna SARS-COVID-2 Vaccination 09/27/2019, 10/25/2019  .  Pneumococcal Conjugate-13 08/24/2014  . Pneumococcal Polysaccharide-23  04/19/2016  . Td 12/08/2008  . Zoster 07/19/2015    Health Maintenance Health Maintenance  Topic Date Due  . INFLUENZA VACCINE  08/05/2020 (Originally 04/16/2020)  . TETANUS/TDAP  05/05/2021 (Originally 12/09/2018)  . MAMMOGRAM  03/06/2022  . COLONOSCOPY  05/20/2028  . DEXA SCAN  Completed  . COVID-19 Vaccine  Completed  . Hepatitis C Screening  Completed  . PNA vac Low Risk Adult  Completed   Influenza vaccine- out of stock.   Tdap- deferred.  Dental Screening: Recommended annual dental exams for proper oral hygiene. Visit every 6 months.   Community Resource Referral / Chronic Care Management: CRR required this visit?  No   CCM required this visit?  No      Plan:   Keep all routine maintenance appointments.   Follow up 05/30/20 @ 1:30  I have personally reviewed and noted the following in the patient's chart:   . Medical and social history . Use of alcohol, tobacco or illicit drugs  . Current medications and supplements . Functional ability and status . Nutritional status . Physical activity . Advanced directives . List of other physicians . Hospitalizations, surgeries, and ER visits in previous 12 months . Vitals . Screenings to include cognitive, depression, and falls . Referrals and appointments  In addition, I have reviewed and discussed with patient certain preventive protocols, quality metrics, and best practice recommendations. A written personalized care plan for preventive services as well as general preventive health recommendations were provided to patient via mychart.     Varney Biles, LPN   [id number]/[id number]

## 2020-05-05 NOTE — Patient Instructions (Addendum)
Katie [name] , Thank you for taking time to come for your Medicare Wellness Visit. I appreciate your ongoing commitment to your health goals. Please review the following plan we discussed and let me know if I can assist you in the future.   These are the goals we discussed: Goals     Follow up with Primary Care Provider     As needed       This is a list of the screening recommended for you and due dates:  Health Maintenance  Topic Date Due   Flu Shot  08/05/2020*   Tetanus Vaccine  05/05/2021*   Mammogram  03/06/2022   Colon Cancer Screening  05/20/2028   DEXA scan (bone density measurement)  Completed   COVID-19 Vaccine  Completed    Hepatitis C: One time screening is recommended by Center for Disease Control  (CDC) for  adults born from [birth year] through 1964.   Completed   Pneumonia vaccines  Completed  *Topic was postponed. The date shown is not the original due date.    Immunizations Immunization History  Administered Date(s) Administered   Influenza,inj,Quad PF,6+ Mos 08/02/2015   Influenza-Unspecified 06/24/2014, 06/17/2019   Moderna SARS-COVID-2 Vaccination 09/27/2019, 10/25/2019   Pneumococcal Conjugate-13 08/24/2014   Pneumococcal Polysaccharide-23 04/19/2016   Td 12/08/2008   Zoster 07/19/2015   Keep all routine maintenance appointments.   Follow up 05/30/20 @ 1:30  Conditions/risks identified: none new.   Follow up in one year for your annual wellness visit    Preventive Care 65 Years and Older, Female Preventive care refers to lifestyle choices and visits with your health care provider that can promote health and wellness. What does preventive care include?  A yearly physical exam. This is also called an annual well check.  Dental exams once or twice a year.  Routine eye exams. Ask your health care provider how often you should have your eyes checked.  Personal lifestyle choices, including:  Daily care of your teeth and gums.  Regular  physical activity.  Eating a healthy diet.  Avoiding tobacco and drug use.  Limiting alcohol use.  Practicing safe sex.  Taking low-dose aspirin every day.  Taking vitamin and mineral supplements as recommended by your health care provider. What happens during an annual well check? The services and screenings done by your health care provider during your annual well check will depend on your age, overall health, lifestyle risk factors, and family history of disease. Counseling  Your health care provider may ask you questions about your:  Alcohol use.  Tobacco use.  Drug use.  Emotional well-being.  Home and relationship well-being.  Sexual activity.  Eating habits.  History of falls.  Memory and ability to understand (cognition).  Work and work Statistician.  Reproductive health. Screening  You may have the following tests or measurements:  Height, weight, and BMI.  Blood pressure.  Lipid and cholesterol levels. These may be checked every 5 years, or more frequently if you are over 65 years old.  Skin check.  Lung cancer screening. You may have this screening every year starting at age 7 if you have a 30-pack-year history of smoking and currently smoke or have quit within the past 15 years.  Fecal occult blood test (FOBT) of the stool. You may have this test every year starting at age 15.  Flexible sigmoidoscopy or colonoscopy. You may have a sigmoidoscopy every 5 years or a colonoscopy every 10 years starting at age 55.  Hepatitis C blood  test.  Hepatitis B blood test.  Sexually transmitted disease (STD) testing.  Diabetes screening. This is done by checking your blood sugar (glucose) after you have not eaten for a while (fasting). You may have this done every 1-3 years.  Bone density scan. This is done to screen for osteoporosis. You may have this done starting at age 73.  Mammogram. This may be done every 1-2 years. Talk to your health care  provider about how often you should have regular mammograms. Talk with your health care provider about your test results, treatment options, and if necessary, the need for more tests. Vaccines  Your health care provider may recommend certain vaccines, such as:  Influenza vaccine. This is recommended every year.  Tetanus, diphtheria, and acellular pertussis (Tdap, Td) vaccine. You may need a Td booster every 10 years.  Zoster vaccine. You may need this after age 98.  Pneumococcal 13-valent conjugate (PCV13) vaccine. One dose is recommended after age 54.  Pneumococcal polysaccharide (PPSV23) vaccine. One dose is recommended after age 61. Talk to your health care provider about which screenings and vaccines you need and how often you need them. This information is not intended to replace advice given to you by your health care provider. Make sure you discuss any questions you have with your health care provider. Document Released: 09/29/2015 Document Revised: 05/22/2016 Document Reviewed: 07/04/2015 Elsevier Interactive Patient Education  2017 [place] Prevention in the Home Falls can cause injuries. They can happen to people of all ages. There are many things you can do to make your home safe and to help prevent falls. What can I do on the outside of my home?  Regularly fix the edges of walkways and driveways and fix any cracks.  Remove anything that might make you trip as you walk through a door, such as a raised step or threshold.  Trim any bushes or trees on the path to your home.  Use bright outdoor lighting.  Clear any walking paths of anything that might make someone trip, such as rocks or tools.  Regularly check to see if handrails are loose or broken. Make sure that both sides of any steps have handrails.  Any raised decks and porches should have guardrails on the edges.  Have any leaves, snow, or ice cleared regularly.  Use sand or salt on walking paths  during winter.  Clean up any spills in your garage right away. This includes oil or grease spills. What can I do in the bathroom?  Use night lights.  Install grab bars by the toilet and in the tub and shower. Do not use towel bars as grab bars.  Use non-skid mats or decals in the tub or shower.  If you need to sit down in the shower, use a plastic, non-slip stool.  Keep the floor dry. Clean up any water that spills on the floor as soon as it happens.  Remove soap buildup in the tub or shower regularly.  Attach bath mats securely with double-sided non-slip rug tape.  Do not have throw rugs and other things on the floor that can make you trip. What can I do in the bedroom?  Use night lights.  Make sure that you have a light by your bed that is easy to reach.  Do not use any sheets or blankets that are too big for your bed. They should not hang down onto the floor.  Have a firm chair that has side arms. You can  use this for support while you get dressed.  Do not have throw rugs and other things on the floor that can make you trip. What can I do in the kitchen?  Clean up any spills right away.  Avoid walking on wet floors.  Keep items that you use a lot in easy-to-reach places.  If you need to reach something above you, use a strong step stool that has a grab bar.  Keep electrical cords out of the way.  Do not use floor polish or wax that makes floors slippery. If you must use wax, use non-skid floor wax.  Do not have throw rugs and other things on the floor that can make you trip. What can I do with my stairs?  Do not leave any items on the stairs.  Make sure that there are handrails on both sides of the stairs and use them. Fix handrails that are broken or loose. Make sure that handrails are as long as the stairways.  Check any carpeting to make sure that it is firmly attached to the stairs. Fix any carpet that is loose or worn.  Avoid having throw rugs at the top  or bottom of the stairs. If you do have throw rugs, attach them to the floor with carpet tape.  Make sure that you have a light switch at the top of the stairs and the bottom of the stairs. If you do not have them, ask someone to add them for you. What else can I do to help prevent falls?  Wear shoes that:  Do not have high heels.  Have rubber bottoms.  Are comfortable and fit you well.  Are closed at the toe. Do not wear sandals.  If you use a stepladder:  Make sure that it is fully opened. Do not climb a closed stepladder.  Make sure that both sides of the stepladder are locked into place.  Ask someone to hold it for you, if possible.  Clearly mark and make sure that you can see:  Any grab bars or handrails.  First and last steps.  Where the edge of each step is.  Use tools that help you move around (mobility aids) if they are needed. These include:  Canes.  Walkers.  Scooters.  Crutches.  Turn on the lights when you go into a dark area. Replace any light bulbs as soon as they burn out.  Set up your furniture so you have a clear path. Avoid moving your furniture around.  If any of your floors are uneven, fix them.  If there are any pets around you, be aware of where they are.  Review your medicines with your doctor. Some medicines can make you feel dizzy. This can increase your chance of falling. Ask your doctor what other things that you can do to help prevent falls. This information is not intended to replace advice given to you by your health care provider. Make sure you discuss any questions you have with your health care provider. Document Released: 06/29/2009 Document Revised: 02/08/2016 Document Reviewed: 10/07/2014 Elsevier Interactive Patient Education  2017 [employer].

## 2020-05-30 ENCOUNTER — Ambulatory Visit: Payer: Medicare PPO | Admitting: Internal Medicine

## 2020-06-21 ENCOUNTER — Ambulatory Visit (INDEPENDENT_AMBULATORY_CARE_PROVIDER_SITE_OTHER): Payer: Medicare PPO

## 2020-06-21 ENCOUNTER — Other Ambulatory Visit: Payer: Self-pay

## 2020-06-21 ENCOUNTER — Encounter: Payer: Self-pay | Admitting: Internal Medicine

## 2020-06-21 ENCOUNTER — Ambulatory Visit: Payer: Medicare PPO | Admitting: Internal Medicine

## 2020-06-21 VITALS — BP 122/70 | HR 57 | Temp 98.2°F | Ht 64.02 in | Wt 121.6 lb

## 2020-06-21 DIAGNOSIS — Z9884 Bariatric surgery status: Secondary | ICD-10-CM

## 2020-06-21 DIAGNOSIS — R002 Palpitations: Secondary | ICD-10-CM | POA: Diagnosis not present

## 2020-06-21 DIAGNOSIS — E538 Deficiency of other specified B group vitamins: Secondary | ICD-10-CM | POA: Diagnosis not present

## 2020-06-21 DIAGNOSIS — R0789 Other chest pain: Secondary | ICD-10-CM

## 2020-06-21 DIAGNOSIS — K8020 Calculus of gallbladder without cholecystitis without obstruction: Secondary | ICD-10-CM

## 2020-06-21 DIAGNOSIS — R1011 Right upper quadrant pain: Secondary | ICD-10-CM | POA: Diagnosis not present

## 2020-06-21 DIAGNOSIS — K219 Gastro-esophageal reflux disease without esophagitis: Secondary | ICD-10-CM

## 2020-06-21 DIAGNOSIS — G250 Essential tremor: Secondary | ICD-10-CM

## 2020-06-21 DIAGNOSIS — M549 Dorsalgia, unspecified: Secondary | ICD-10-CM

## 2020-06-21 DIAGNOSIS — E611 Iron deficiency: Secondary | ICD-10-CM

## 2020-06-21 DIAGNOSIS — N644 Mastodynia: Secondary | ICD-10-CM

## 2020-06-21 DIAGNOSIS — E559 Vitamin D deficiency, unspecified: Secondary | ICD-10-CM

## 2020-06-21 DIAGNOSIS — M546 Pain in thoracic spine: Secondary | ICD-10-CM | POA: Diagnosis not present

## 2020-06-21 DIAGNOSIS — E785 Hyperlipidemia, unspecified: Secondary | ICD-10-CM

## 2020-06-21 DIAGNOSIS — K8050 Calculus of bile duct without cholangitis or cholecystitis without obstruction: Secondary | ICD-10-CM

## 2020-06-21 DIAGNOSIS — F339 Major depressive disorder, recurrent, unspecified: Secondary | ICD-10-CM

## 2020-06-21 DIAGNOSIS — R110 Nausea: Secondary | ICD-10-CM

## 2020-06-21 DIAGNOSIS — Z23 Encounter for immunization: Secondary | ICD-10-CM

## 2020-06-21 NOTE — Progress Notes (Addendum)
Chief Complaint  Patient presents with  . Follow-up   F/u  1. Left breast was sore to touch over last few weeks but resolved she thinks due to heavy lifting of clients at [facility] but will let me know and if returns will consider dx mammo as 2021 mammo negative  2. Abnormal Xray low back and having right sided flank pain [facility] f/u due to h/o gastric bypass and they think could be MSK and rec lidocaine pain patch and voltaren gell and rec consider imaging. She had lumbar imaging:  She still has intermittent right sided flank RUQ pain/RMQ pain did not try carafate 1 mg qid prn given by GI b/c thought tx for gERD and denies this sx. Agreeable Xray mid back today  04/10/18 FINDINGS: Levoscoliosis centered at the thoracolumbar junction. Generalized mild disc narrowing. Mild or moderate facet spurring asymmetric to the left at L3-4. L4-5 leftward disc narrowing and far-lateral spurring. No evidence of fracture, bone lesion, or endplate erosion. There is a lucency over the right L5 pars interarticularis on the oblique view, but not definitive for a fracture lucency. No defect seen on axial slices of the lumbar spine by 2006 abdominal CT.  IMPRESSION: 1. No acute finding. 2. Thoracolumbar levoscoliosis. 3. Mild to moderate facet spurring and mild L4-5 disc narrowing.  Declines PT for now  3. Recently had palpitations due to pharmacy not filling propranolol 40 mg bid and did not have sat to Monday but has notw    Review of Systems  Constitutional: Negative for weight loss.  HENT: Negative for hearing loss.   Eyes: Negative for blurred vision.  Respiratory: Negative for shortness of breath.   Cardiovascular: Negative for chest pain.  Gastrointestinal: Negative for abdominal pain.  Musculoskeletal: Positive for back pain.  Skin: Negative for rash.  Neurological: Negative for headaches.  Psychiatric/Behavioral: Negative for depression.   Past Medical History:  Diagnosis Date  .  Anxiety   . Bile duct stricture   . Breast mass, left   . Choledocholithiasis    recurrent  . GERD (gastroesophageal reflux disease)   . Hypertension   . Migraine without aura, without mention of intractable migraine without mention of status migrainosus 01/03/2014  . Rapid heart beat    Past Surgical History:  Procedure Laterality Date  . ABDOMINAL HYSTERECTOMY    . BLADDER SURGERY     x 2  . BREAST EXCISIONAL BIOPSY Right 2003   benign  . BREAST LUMPECTOMY     right  . BREAST LUMPECTOMY WITH RADIOACTIVE SEED LOCALIZATION Left 03/31/2017   Procedure: LEFT BREAST LUMPECTOMY WITH RADIOACTIVE SEED LOCALIZATION;  Surgeon: Coralie Keens, MD;  Location: [facility];  Service: General;  Laterality: Left;  . CHOLECYSTECTOMY    . COLONOSCOPY    . Explor Lap, Hepatoduodenostomy  07/23/11   Dr Lazarus Gowda, [facility]  . FOOT SURGERY     both feet  . GASTRIC BYPASS  05/04/2013  . PARTIAL HYSTERECTOMY    . TUBAL LIGATION    . VEIN LIGATION AND STRIPPING     left leg  . WRIST SURGERY     right   Family History  Problem Relation Age of Onset  . Cancer Mother        lung, smoker  . Alcohol abuse Father   . GER disease Sister   . GER disease Brother   . Depression Sister   . Anxiety disorder Sister   . Hypertension Sister   . Migraines Neg Hx  Social History   Socioeconomic History  . Marital status: Married    Spouse name: Not on file  . Number of children: 2  . Years of education: college  . Highest education level: Not on file  Occupational History  . Occupation: retired    Fish farm manager: RETIRED  Tobacco Use  . Smoking status: Never Smoker  . Smokeless tobacco: Never Used  Vaping Use  . Vaping Use: Never used  Substance and Sexual Activity  . Alcohol use: No  . Drug use: No  . Sexual activity: Yes  Other Topics Concern  . Not on file  Social History Narrative   Does not have a living will or HPOA.   Desires CPR.   Does not want prolonged life support.   Social Determinants of  Health   Financial Resource Strain: Low Risk   . Difficulty of Paying Living Expenses: Not hard at all  Food Insecurity: No Food Insecurity  . Worried About Charity fundraiser in the Last Year: Never true  . Ran Out of Food in the Last Year: Never true  Transportation Needs: No Transportation Needs  . Lack of Transportation (Medical): No  . Lack of Transportation (Non-Medical): No  Physical Activity:   . Days of Exercise per Week: Not on file  . Minutes of Exercise per Session: Not on file  Stress: No Stress Concern Present  . Feeling of Stress : Not at all  Social Connections: Unknown  . Frequency of Communication with Friends and Family: More than three times a week  . Frequency of Social Gatherings with Friends and Family: More than three times a week  . Attends Religious Services: Not on file  . Active Member of Clubs or Organizations: Not on file  . Attends Archivist Meetings: Not on file  . Marital Status: Not on file  Intimate Partner Violence: Not At Risk  . Fear of Current or Ex-Partner: No  . Emotionally Abused: No  . Physically Abused: No  . Sexually Abused: No   Current Meds  Medication Sig  . aspirin 81 MG tablet Take 81 mg by mouth daily.  . Biotin 1 MG CAPS Take 1 mg by mouth daily.   . CHOLECALCIFEROL PO Take by mouth.  . cyanocobalamin (,VITAMIN B-12,) 1000 MCG/ML injection Inject 1 mL (1,000 mcg total) into the muscle every 30 (thirty) days.  Marland Kitchen esomeprazole (NEXIUM) 40 MG capsule Take 1 capsule (40 mg total) by mouth daily. 30 minutes before food  . ezetimibe (ZETIA) 10 MG tablet Take 1 tablet (10 mg total) by mouth daily.  . meloxicam (MOBIC) 15 MG tablet TAKE 1 TABLET(15 MG) BY MOUTH DAILY  . Multiple Vitamin (MULTI-VITAMINS) TABS Take 1 tablet by mouth daily.   . ondansetron (ZOFRAN) 4 MG tablet Take 1 tablet (4 mg total) by mouth every 8 (eight) hours as needed for nausea or vomiting.  . propranolol (INDERAL) 40 MG tablet Take 1 tablet (40 mg  total) by mouth 2 (two) times daily.  . simvastatin (ZOCOR) 40 MG tablet Take 1 tablet (40 mg total) by mouth daily at 6 PM. Note reduced dose  . ursodiol (ACTIGALL) 250 MG tablet TAKE 1 TABLET(250 MG) BY MOUTH THREE TIMES DAILY  . venlafaxine (EFFEXOR) 37.5 MG tablet TAKE 2 TABLETS BY MOUTH EVERY MORNING AND 1 TABLET EVERY EVENING   Allergies  Allergen Reactions  . Lipitor [Atorvastatin Calcium]     Joint pain   No results found for this or any previous  visit (from the past 2160 hour(s)). Objective  Body mass index is 20.86 kg/m. Wt Readings from Last 3 Encounters:  06/21/20 121 lb 9.6 oz (55.2 kg)  05/05/20 116 lb (52.6 kg)  11/24/19 116 lb 12.8 oz (53 kg)   Temp Readings from Last 3 Encounters:  06/21/20 98.2 F (36.8 C)  11/24/19 (!) 97.4 F (36.3 C) (Temporal)  08/26/18 97.9 F (36.6 C) (Oral)   BP Readings from Last 3 Encounters:  06/21/20 122/70  11/24/19 116/70  08/26/18 (!) 142/64   Pulse Readings from Last 3 Encounters:  06/21/20 (!) 57  11/24/19 61  08/26/18 64    Physical Exam Vitals and nursing note reviewed.  Constitutional:      Appearance: Normal appearance. She is well-developed.  HENT:     Head: Normocephalic and atraumatic.  Eyes:     Conjunctiva/sclera: Conjunctivae normal.     Pupils: Pupils are equal, round, and reactive to light.  Cardiovascular:     Rate and Rhythm: Normal rate and regular rhythm.     Heart sounds: Normal heart sounds. No murmur heard.   Pulmonary:     Effort: Pulmonary effort is normal.     Breath sounds: Normal breath sounds.  Musculoskeletal:     Thoracic back: No tenderness.     Lumbar back: No tenderness.  Skin:    General: Skin is warm and dry.  Neurological:     General: No focal deficit present.     Mental Status: She is alert and oriented to person, place, and time. Mental status is at baseline.     Gait: Gait normal.  Psychiatric:        Attention and Perception: Attention and perception normal.         Mood and Affect: Mood and affect normal.        Speech: Speech normal. Rapid and pressured: hld.        Behavior: Behavior normal. Behavior is cooperative.        Thought Content: Thought content normal.        Cognition and Memory: Cognition and memory normal.        Judgment: Judgment normal.     Assessment  Plan  Mid back pain - Plan: DG Thoracic Spine 2 View Declines PT for now given exercises   Palpitations - Plan: Comprehensive metabolic panel, CBC with Differential/Platelet, TSH Cont propranolol 40 mg bid   Hyperlipidemia, unspecified hyperlipidemia type - Plan: Lipid panel, Comprehensive metabolic panel On zetia 10 and zocor 40 controlled continue   S/P gastric bypass Vitamin D deficiency - Plan: Vitamin D (25 hydroxy) B12 deficiency - Plan: Vitamin B12 Iron deficiency - Plan: Iron, TIBC and Ferritin Panel   HM Flu shot utd will get [facility] 2021 Tdap, shingrix vaccine sent to pharmacy  Immune MMR moderna 2/2   mammo 03/06/20 neg  left breast pain 07/2020 call  Called to speak with the Patient. She is having left breast pain. A stabbing pain rated 5/10, ongoing for 3 weeks, no known injury, mainly along the underside of the breast but the pain has been all over the left breast as well.   Patient had recent mammogram and manual breast exam that was normal. She is requesting an ultrasound of the left breast.       Documentation   [facility] Service routed conversation to Thressa Sheller, CMA 7 days ago  Richardine Service 7 days ago  IJ   Patient called stated that she wants Dr.Mclean to  make appointmentt at the breast center for what they discussed in visit about her left breast in [facility] and Friday would be a good day to schedule she said she could call her if she needed to.      Documentation      Pap s/p hysterectomy for prolapse ovaries still intact pap 03/15/15 neg -of note menopause age 80 yo started cycle age 40 y.o   colonoscopy had 04/16/13 tubular  adenoma x 1 referred [facility] today Dr. Tillie Rung and f/u other GI issues s/p gastric bypass 2014 see HPI -colonoscopy 05/20/18 negative polyps no bx f/u in 5 years  DEXA get copy prior PCP Dr. Vernard Gambles need to sign release at f/u  HCV neg 04/20/11   F/u with dermatology yearly Dr. Rolm Bookbinder [place] due h/o NMSC nose -dermatology 10/11 2019 negative tbse, seen 2020 f/u in 1 year upcoming    [facility] appt 06/17/2019 DEXA ordered last scan early osteoporosis  Provider: Dr. Olivia Mackie McLean-Scocuzza-[facility]

## 2020-06-21 NOTE — Patient Instructions (Addendum)
Consider MSK etiology of this intermittent right upper sided and right back pain, in absence of aggravation/alleviation with meals or stooling. Also consider bile reflux. Pneumobilia in liver on imaging is longstanding and likely related to prior ERCPs with sphincterotomies and post-operative anatomy. Can trial Carafate to neutralize any bile PRN for the right sided discomfort. Suggested she ask PCP about some spine imaging where there is a tender area at about T12 that correlates with level or right sided pain querying radiculopathy. For now can trial Voltaren gel, lidocaine patch, and heat to the right back pain PRN, and maintain good posture for lifting during work as [employer]. Less likely renal colic/kidney stone.   1. Can take Carafate 1 tablet dissolved in some spoonfuls of water when the right sided pain happens  2. Try Voltaren gel to right back and recommend having an x-ray of the area of your spine where tender   Try Voltaren gel to chest     Costochondritis  Costochondritis is swelling and irritation (inflammation) of the tissue (cartilage) that connects your ribs to your breastbone (sternum). This causes pain in the front of your chest. The pain usually starts gradually and involves more than one rib. What are the causes? The exact cause of this condition is not always known. It results from stress on the cartilage where your ribs attach to your sternum. The cause of this stress could be:  Chest injury (trauma).  Exercise or activity, such as lifting.  Severe coughing. What increases the risk? You may be at higher risk for this condition if you:  Are female.  Are 56?73 years old.  Recently started a new exercise or work activity.  Have low levels of vitamin D.  Have a condition that makes you cough frequently. What are the signs or symptoms? The main symptom of this condition is chest pain. The pain:  Usually starts gradually and can be sharp or dull.  Gets worse with  deep breathing, coughing, or exercise.  Gets better with rest.  May be worse when you press on the sternum-rib connection (tenderness). How is this diagnosed? This condition is diagnosed based on your symptoms, medical history, and a physical exam. Your health care provider will check for tenderness when pressing on your sternum. This is the most important finding. You may also have tests to rule out other causes of chest pain. These may include:  A chest X-ray to check for lung problems.  An electrocardiogram (ECG) to see if you have a heart problem that could be causing the pain.  An imaging scan to rule out a chest or rib fracture. How is this treated? This condition usually goes away on its own over time. Your health care provider may prescribe an NSAID to reduce pain and inflammation. Your health care provider may also suggest that you:  Rest and avoid activities that make pain worse.  Apply heat or cold to the area to reduce pain and inflammation.  Do exercises to stretch your chest muscles. If these treatments do not help, your health care provider may inject a numbing medicine at the sternum-rib connection to help relieve the pain. Follow these instructions at home:  Avoid activities that make pain worse. This includes any activities that use chest, abdominal, and side muscles.  If directed, put ice on the painful area: ? Put ice in a plastic bag. ? Place a towel between your skin and the bag. ? Leave the ice on for 20 minutes, 2-3 times a day.  If directed, apply heat to the affected area as often as told by your health care provider. Use the heat source that your health care provider recommends, such as a moist heat pack or a heating pad. ? Place a towel between your skin and the heat source. ? Leave the heat on for 20-30 minutes. ? Remove the heat if your skin turns bright red. This is especially important if you are unable to feel pain, heat, or cold. You may have a  greater risk of getting burned.  Take over-the-counter and prescription medicines only as told by your health care provider.  Return to your normal activities as told by your health care provider. Ask your health care provider what activities are safe for you.  Keep all follow-up visits as told by your health care provider. This is important. Contact a health care provider if:  You have chills or a fever.  Your pain does not go away or it gets worse.  You have a cough that does not go away (is persistent). Get help right away if:  You have shortness of breath. This information is not intended to replace advice given to you by your health care provider. Make sure you discuss any questions you have with your health care provider. Document Revised: 09/17/2017 Document Reviewed: 12/27/2015 Elsevier Patient Education  2020 [employer].      Thoracic Strain Rehab Ask your health care provider which exercises are safe for you. Do exercises exactly as told by your health care provider and adjust them as directed. It is normal to feel mild stretching, pulling, tightness, or discomfort as you do these exercises. Stop right away if you feel sudden pain or your pain gets worse. Do not begin these exercises until told by your health care provider. Stretching and range-of-motion exercise This exercise warms up your muscles and joints and improves the movement and flexibility of your back and shoulders. This exercise also helps to relieve pain. Chest and spine stretch  1. Lie down on your back on a firm surface. 2. Roll a towel or a small blanket so it is about 4 inches (10 cm) in diameter. 3. Put the towel lengthwise under the middle of your back so it is under your spine, but not under your shoulder blades. 4. Put your hands behind your head and let your elbows fall to your sides. This will increase your stretch. 5. Take a deep breath (inhale). 6. Hold for __________ seconds. 7. Relax after  you breathe out (exhale). Repeat __________ times. Complete this exercise __________ times a day. Strengthening exercises These exercises build strength and endurance in your back and your shoulder blade muscles. Endurance is the ability to use your muscles for a long time, even after they get tired. Alternating arm and leg raises  1. Get on your hands and knees on a firm surface. If you are on a hard floor, you may want to use padding, such as an exercise mat, to cushion your knees. 2. Line up your arms and legs. Your hands should be directly below your shoulders, and your knees should be directly below your hips. 3. Lift your left leg behind you. At the same time, raise your right arm and straighten it in front of you. ? Do not lift your leg higher than your hip. ? Do not lift your arm higher than your shoulder. ? Keep your abdominal and back muscles tight. ? Keep your hips facing the ground. ? Do not arch your  back. ? Keep your balance carefully, and do not hold your breath. 4. Hold for __________ seconds. 5. Slowly return to the starting position and repeat with your right leg and your left arm. Repeat __________ times. Complete this exercise __________ times a day. Straight arm rows This exercise is also called shoulder extension exercise. 1. Stand with your feet shoulder width apart. 2. Secure an exercise band to a stable object in front of you so the band is at or above shoulder height. 3. Hold one end of the exercise band in each hand. 4. Straighten your elbows and lift your hands up to shoulder height. 5. Step back, away from the secured end of the exercise band, until the band stretches. 6. Squeeze your shoulder blades together and pull your hands down to the sides of your thighs. Stop when your hands are straight down by your sides. This is shoulder extension. Do not let your hands go behind your body. 7. Hold for __________ seconds. 8. Slowly return to the starting  position. Repeat __________ times. Complete this exercise __________ times a day. Prone shoulder external rotation 1. Lie on your abdomen on a firm bed so your left / right forearm hangs over the edge of the bed and your upper arm is on the bed, straight out from your body. This is the prone position. ? Your elbow should be bent. ? Your palm should be facing your feet. 2. If instructed, hold a __________ weight in your hand. 3. Squeeze your shoulder blade toward the middle of your back. Do not let your shoulder lift toward your ear. 4. Keep your elbow bent in a 90-degree angle (right angle) while you slowly move your forearm up toward the ceiling. Move your forearm up to the height of the bed, toward your head. This is external rotation. ? Your upper arm should not move. ? At the top of the movement, your palm should face the floor. 5. Hold for __________ seconds. 6. Slowly return to the starting position and relax your muscles. Repeat __________ times. Complete this exercise __________ times a day. Rowing scapular retraction This is an exercise in which the shoulder blades (scapulae) are pulled toward each other (retraction). 1. Sit in a stable chair without armrests, or stand up. 2. Secure an exercise band to a stable object in front of you so the band is at shoulder height. 3. Hold one end of the exercise band in each hand. Your palms should face down. 4. Bring your arms out straight in front of you. 5. Step back, away from the secured end of the exercise band, until the band stretches. 6. Pull the band backward. As you do this, bend your elbows and squeeze your shoulder blades together, but avoid letting the rest of your body move. Do not shrug your shoulders upward while you do this. 7. Stop when your elbows are at your sides or slightly behind your body. 8. Hold for __________ seconds. 9. Slowly straighten your arms to return to the starting position. Repeat __________ times. Complete  this exercise __________ times a day. Posture and body mechanics Good posture and healthy body mechanics can help to relieve stress in your body's tissues and joints. Body mechanics refers to the movements and positions of your body while you do your daily activities. Posture is part of body mechanics. Good posture means:  Your spine is in its natural S-curve position (neutral).  Your shoulders are pulled back slightly.  Your head is not tipped forward.  Follow these guidelines to improve your posture and body mechanics in your everyday activities. Standing   When standing, keep your spine neutral and your feet about hip width apart. Keep a slight bend in your knees. Your ears, shoulders, and hips should line up with each other.  When you do a task in which you lean forward while standing in one place for a long time, place one foot up on a stable object that is 2-4 inches (5-10 cm) high, such as a footstool. This helps keep your spine neutral. Sitting   When sitting, keep your spine neutral and keep your feet flat on the floor. Use a footrest, if necessary, and keep your thighs parallel to the floor. Avoid rounding your shoulders, and avoid tilting your head forward.  When working at a desk or a computer, keep your desk at a height where your hands are slightly lower than your elbows. Slide your chair under your desk so you are close enough to maintain good posture.  When working at a computer, place your monitor at a height where you are looking straight ahead and you do not have to tilt your head forward or downward to look at the screen. Resting When lying down and resting, avoid positions that are most painful for you.  If you have pain with activities such as sitting, bending, stooping, or squatting (flexion-basedactivities), lie in a position in which your body does not bend very much. For example, avoid curling up on your side with your arms and knees near your chest (fetal  position).  If you have pain with activities such as standing for a long time or reaching with your arms (extension-basedactivities), lie with your spine in a neutral position and bend your knees slightly. Try the following positions: ? Lie on your side with a pillow between your knees. ? Lie on your back with a pillow under your knees.  Lifting   When lifting objects, keep your feet at least shoulder width apart and tighten your abdominal muscles.  Bend your knees and hips and keep your spine neutral. It is important to lift using the strength of your legs, not your back. Do not lock your knees straight out.  Always ask for help to lift heavy or awkward objects. This information is not intended to replace advice given to you by your health care provider. Make sure you discuss any questions you have with your health care provider. Document Revised: 12/25/2018 Document Reviewed: 10/12/2018 Elsevier Patient Education  [place].  Back Exercises The following exercises strengthen the muscles that help to support the trunk and back. They also help to keep the lower back flexible. Doing these exercises can help to prevent back pain or lessen existing pain.  If you have back pain or discomfort, try doing these exercises 2-3 times each day or as told by your health care provider.  As your pain improves, do them once each day, but increase the number of times that you repeat the steps for each exercise (do more repetitions).  To prevent the recurrence of back pain, continue to do these exercises once each day or as told by your health care provider. Do exercises exactly as told by your health care provider and adjust them as directed. It is normal to feel mild stretching, pulling, tightness, or discomfort as you do these exercises, but you should stop right away if you feel sudden pain or your pain gets worse. Exercises Single knee to chest Repeat these  steps 3-5 times for each  leg: 1. Lie on your back on a firm bed or the floor with your legs extended. 2. Bring one knee to your chest. Your other leg should stay extended and in contact with the floor. 3. Hold your knee in place by grabbing your knee or thigh with both hands and hold. 4. Pull on your knee until you feel a gentle stretch in your lower back or buttocks. 5. Hold the stretch for 10-30 seconds. 6. Slowly release and straighten your leg. Pelvic tilt Repeat these steps 5-10 times: 1. Lie on your back on a firm bed or the floor with your legs extended. 2. Bend your knees so they are pointing toward the ceiling and your feet are flat on the floor. 3. Tighten your lower abdominal muscles to press your lower back against the floor. This motion will tilt your pelvis so your tailbone points up toward the ceiling instead of pointing to your feet or the floor. 4. With gentle tension and even breathing, hold this position for 5-10 seconds. Cat-cow Repeat these steps until your lower back becomes more flexible: 1. Get into a hands-and-knees position on a firm surface. Keep your hands under your shoulders, and keep your knees under your hips. You may place padding under your knees for comfort. 2. Let your head hang down toward your chest. Contract your abdominal muscles and point your tailbone toward the floor so your lower back becomes rounded like the back of a cat. 3. Hold this position for 5 seconds. 4. Slowly lift your head, let your abdominal muscles relax and point your tailbone up toward the ceiling so your back forms a sagging arch like the back of a cow. 5. Hold this position for 5 seconds.  Press-ups Repeat these steps 5-10 times: 1. Lie on your abdomen (face-down) on the floor. 2. Place your palms near your head, about shoulder-width apart. 3. Keeping your back as relaxed as possible and keeping your hips on the floor, slowly straighten your arms to raise the top half of your body and lift your shoulders.  Do not use your back muscles to raise your upper torso. You may adjust the placement of your hands to make yourself more comfortable. 4. Hold this position for 5 seconds while you keep your back relaxed. 5. Slowly return to lying flat on the floor.  Bridges Repeat these steps 10 times: 1. Lie on your back on a firm surface. 2. Bend your knees so they are pointing toward the ceiling and your feet are flat on the floor. Your arms should be flat at your sides, next to your body. 3. Tighten your buttocks muscles and lift your buttocks off the floor until your waist is at almost the same height as your knees. You should feel the muscles working in your buttocks and the back of your thighs. If you do not feel these muscles, slide your feet 1-2 inches farther away from your buttocks. 4. Hold this position for 3-5 seconds. 5. Slowly lower your hips to the starting position, and allow your buttocks muscles to relax completely. If this exercise is too easy, try doing it with your arms crossed over your chest. Abdominal crunches Repeat these steps 5-10 times: 1. Lie on your back on a firm bed or the floor with your legs extended. 2. Bend your knees so they are pointing toward the ceiling and your feet are flat on the floor. 3. Cross your arms over your chest. 4. Tip  your chin slightly toward your chest without bending your neck. 5. Tighten your abdominal muscles and slowly raise your trunk (torso) high enough to lift your shoulder blades a tiny bit off the floor. Avoid raising your torso higher than that because it can put too much stress on your low back and does not help to strengthen your abdominal muscles. 6. Slowly return to your starting position. Back lifts Repeat these steps 5-10 times: 1. Lie on your abdomen (face-down) with your arms at your sides, and rest your forehead on the floor. 2. Tighten the muscles in your legs and your buttocks. 3. Slowly lift your chest off the floor while you keep  your hips pressed to the floor. Keep the back of your head in line with the curve in your back. Your eyes should be looking at the floor. 4. Hold this position for 3-5 seconds. 5. Slowly return to your starting position. Contact a health care provider if:  Your back pain or discomfort gets much worse when you do an exercise.  Your worsening back pain or discomfort does not lessen within 2 hours after you exercise. If you have any of these problems, stop doing these exercises right away. Do not do them again unless your health care provider says that you can. Get help right away if:  You develop sudden, severe back pain. If this happens, stop doing the exercises right away. Do not do them again unless your health care provider says that you can. This information is not intended to replace advice given to you by your health care provider. Make sure you discuss any questions you have with your health care provider. Document Revised: 01/07/2019 Document Reviewed: 06/04/2018 Elsevier Patient Education  [place].

## 2020-06-22 ENCOUNTER — Telehealth: Payer: Self-pay

## 2020-06-22 MED ORDER — SIMVASTATIN 40 MG PO TABS: 40.0000 mg | ORAL_TABLET | Freq: Every day | ORAL | 3 refills | Status: DC

## 2020-06-22 MED ORDER — VENLAFAXINE HCL 37.5 MG PO TABS: ORAL_TABLET | ORAL | 3 refills | Status: DC

## 2020-06-22 MED ORDER — PROPRANOLOL HCL 40 MG PO TABS: 40.0000 mg | ORAL_TABLET | Freq: Two times a day (BID) | ORAL | 3 refills | Status: DC

## 2020-06-22 MED ORDER — TETANUS-DIPHTH-ACELL PERTUSSIS 5-2.5-18.5 LF-MCG/0.5 IM SUSP: 0.5000 mL | Freq: Once | INTRAMUSCULAR | 0 refills | Status: AC

## 2020-06-22 MED ORDER — URSODIOL 250 MG PO TABS: ORAL_TABLET | ORAL | 3 refills | Status: DC

## 2020-06-22 MED ORDER — ESOMEPRAZOLE MAGNESIUM 40 MG PO CPDR: 40.0000 mg | DELAYED_RELEASE_CAPSULE | Freq: Every day | ORAL | 3 refills | Status: DC

## 2020-06-22 MED ORDER — SHINGRIX 50 MCG/0.5ML IM SUSR: 0.5000 mL | Freq: Once | INTRAMUSCULAR | 0 refills | Status: AC

## 2020-06-22 MED ORDER — EZETIMIBE 10 MG PO TABS: 10.0000 mg | ORAL_TABLET | Freq: Every day | ORAL | 3 refills | Status: DC

## 2020-06-22 MED ORDER — CYANOCOBALAMIN 1000 MCG/ML IJ SOLN: 1000.0000 ug | INTRAMUSCULAR | 11 refills | Status: DC

## 2020-06-22 MED ORDER — ONDANSETRON HCL 4 MG PO TABS: 4.0000 mg | ORAL_TABLET | Freq: Three times a day (TID) | ORAL | 11 refills | Status: DC | PRN

## 2020-06-22 NOTE — Telephone Encounter (Signed)
Attempted to call and speak to patient. Call connected but was ended as I was introducing myself.

## 2020-06-22 NOTE — Telephone Encounter (Signed)
-----   Message from Delorise Jackson, MD sent at 06/22/2020  9:37 AM EDT ----- Xray mid back normal

## 2020-06-23 ENCOUNTER — Other Ambulatory Visit (INDEPENDENT_AMBULATORY_CARE_PROVIDER_SITE_OTHER): Payer: Medicare PPO

## 2020-06-23 ENCOUNTER — Other Ambulatory Visit: Payer: Medicare PPO

## 2020-06-23 ENCOUNTER — Other Ambulatory Visit: Payer: Self-pay

## 2020-06-23 DIAGNOSIS — E611 Iron deficiency: Secondary | ICD-10-CM

## 2020-06-23 DIAGNOSIS — E785 Hyperlipidemia, unspecified: Secondary | ICD-10-CM | POA: Diagnosis not present

## 2020-06-23 DIAGNOSIS — E559 Vitamin D deficiency, unspecified: Secondary | ICD-10-CM

## 2020-06-23 DIAGNOSIS — R002 Palpitations: Secondary | ICD-10-CM

## 2020-06-23 DIAGNOSIS — E538 Deficiency of other specified B group vitamins: Secondary | ICD-10-CM | POA: Diagnosis not present

## 2020-06-23 NOTE — Addendum Note (Signed)
Addended by: Leeanne Rio on: 06/23/2020 03:23 PM   Modules accepted: Orders

## 2020-06-24 LAB — TSH: TSH: 3.22 m[IU]/L (ref 0.40–4.50)

## 2020-06-24 LAB — COMPREHENSIVE METABOLIC PANEL WITH GFR
AG Ratio: 1.9 (calc) (ref 1.0–2.5)
ALT: 11 U/L (ref 6–29)
AST: 11 U/L (ref 10–35)
Albumin: 4.4 g/dL (ref 3.6–5.1)
Alkaline phosphatase (APISO): 104 U/L (ref 37–153)
BUN: 14 mg/dL (ref 7–25)
CO2: 27 mmol/L (ref 20–32)
Calcium: 9.4 mg/dL (ref 8.6–10.4)
Chloride: 104 mmol/L (ref 98–110)
Creat: 0.8 mg/dL (ref 0.60–0.93)
Globulin: 2.3 g/dL (ref 1.9–3.7)
Glucose, Bld: 93 mg/dL (ref 65–99)
Potassium: 4.6 mmol/L (ref 3.5–5.3)
Sodium: 139 mmol/L (ref 135–146)
Total Bilirubin: 0.7 mg/dL (ref 0.2–1.2)
Total Protein: 6.7 g/dL (ref 6.1–8.1)

## 2020-06-24 LAB — IRON,TIBC AND FERRITIN PANEL
%SAT: 27 % (calc) (ref 16–45)
Ferritin: 13 ng/mL — ABNORMAL LOW (ref 16–288)
Iron: 121 ug/dL (ref 45–160)
TIBC: 442 mcg/dL (calc) (ref 250–450)

## 2020-06-24 LAB — LIPID PANEL
Cholesterol: 214 mg/dL — ABNORMAL HIGH
HDL: 76 mg/dL
LDL Cholesterol (Calc): 111 mg/dL — ABNORMAL HIGH
Non-HDL Cholesterol (Calc): 138 mg/dL — ABNORMAL HIGH
Total CHOL/HDL Ratio: 2.8 (calc)
Triglycerides: 153 mg/dL — ABNORMAL HIGH

## 2020-06-24 LAB — CBC WITH DIFFERENTIAL/PLATELET
Absolute Monocytes: 570 cells/uL (ref 200–950)
Basophils Absolute: 30 cells/uL (ref 0–200)
Basophils Relative: 0.5 %
Eosinophils Absolute: 210 cells/uL (ref 15–500)
Eosinophils Relative: 3.5 %
HCT: 40.2 % (ref 35.0–45.0)
Hemoglobin: 13.9 g/dL (ref 11.7–15.5)
Lymphs Abs: 1134 cells/uL (ref 850–3900)
MCH: 31.3 pg (ref 27.0–33.0)
MCHC: 34.6 g/dL (ref 32.0–36.0)
MCV: 90.5 fL (ref 80.0–100.0)
MPV: 11.2 fL (ref 7.5–12.5)
Monocytes Relative: 9.5 %
Neutro Abs: 4056 cells/uL (ref 1500–7800)
Neutrophils Relative %: 67.6 %
Platelets: 275 10*3/uL (ref 140–400)
RBC: 4.44 10*6/uL (ref 3.80–5.10)
RDW: 12 % (ref 11.0–15.0)
Total Lymphocyte: 18.9 %
WBC: 6 10*3/uL (ref 3.8–10.8)

## 2020-06-24 LAB — VITAMIN B12: Vitamin B-12: 413 pg/mL (ref 200–1100)

## 2020-06-24 LAB — VITAMIN D 25 HYDROXY (VIT D DEFICIENCY, FRACTURES): Vit D, 25-Hydroxy: 30 ng/mL (ref 30–100)

## 2020-06-27 ENCOUNTER — Telehealth: Payer: Self-pay | Admitting: Internal Medicine

## 2020-06-27 NOTE — Telephone Encounter (Signed)
Request sent via Epic routing  

## 2020-06-27 NOTE — Telephone Encounter (Signed)
-----   Message from Delorise Jackson, MD sent at 06/22/2020  7:39 AM EDT ----- [facility] appt 06/17/2019 DEXA ordered last scan early osteoporosisNeeds copy from [facility]

## 2020-07-05 DIAGNOSIS — Z01419 Encounter for gynecological examination (general) (routine) without abnormal findings: Secondary | ICD-10-CM | POA: Diagnosis not present

## 2020-07-05 DIAGNOSIS — Z6821 Body mass index (BMI) 21.0-21.9, adult: Secondary | ICD-10-CM | POA: Diagnosis not present

## 2020-07-06 ENCOUNTER — Encounter: Payer: Self-pay | Admitting: Internal Medicine

## 2020-07-06 DIAGNOSIS — M810 Age-related osteoporosis without current pathological fracture: Secondary | ICD-10-CM

## 2020-07-07 NOTE — Telephone Encounter (Signed)
Results received and abstracted.

## 2020-07-24 ENCOUNTER — Telehealth: Payer: Self-pay | Admitting: Internal Medicine

## 2020-07-24 NOTE — Telephone Encounter (Signed)
Patient called stated that she wants Dr.Mclean to make appointmentt at the breast center for what they discussed in visit about her left breast in [facility] and Friday would be a good day to schedule she said she could call her if she needed to.

## 2020-07-26 NOTE — Telephone Encounter (Signed)
Called to speak with the Patient. She is having left breast pain. A stabbing pain rated 5/10, ongoing for 3 weeks, no known injury, mainly along the underside of the breast but the pain has been all over the left breast as well.   Patient had recent mammogram and manual breast exam that was normal. She is requesting an ultrasound of the left breast.

## 2020-07-29 DIAGNOSIS — J069 Acute upper respiratory infection, unspecified: Secondary | ICD-10-CM | POA: Diagnosis not present

## 2020-07-29 DIAGNOSIS — Z20822 Contact with and (suspected) exposure to covid-19: Secondary | ICD-10-CM | POA: Diagnosis not present

## 2020-07-29 DIAGNOSIS — R0982 Postnasal drip: Secondary | ICD-10-CM | POA: Diagnosis not present

## 2020-07-29 DIAGNOSIS — M791 Myalgia, unspecified site: Secondary | ICD-10-CM | POA: Diagnosis not present

## 2020-07-31 ENCOUNTER — Telehealth: Payer: Self-pay | Admitting: Internal Medicine

## 2020-07-31 NOTE — Telephone Encounter (Signed)
Received

## 2020-07-31 NOTE — Telephone Encounter (Signed)
Patient has been sick since last Thursday. She was seen by urgent care last Friday and was tested for flu and COVID-19. Both tests were negative.   Patient's symptoms include: Runny nose, drainage, sore throat, cough, and green mucous. Patient states her symptoms are worsening.  Patient is agreeable to a consultation fee if a medication can be sent in.   [facility]

## 2020-07-31 NOTE — Addendum Note (Signed)
Addended by: Orland Mustard on: 07/31/2020 10:30 AM   Modules accepted: Orders

## 2020-07-31 NOTE — Telephone Encounter (Signed)
Please advise 

## 2020-07-31 NOTE — Telephone Encounter (Signed)
Patient called stated that she would her appointment for [facility] instead of [facility]

## 2020-07-31 NOTE — Telephone Encounter (Signed)
Patient states she has a cardiologist and will call ASAP to schedule with them

## 2020-07-31 NOTE — Telephone Encounter (Signed)
Sch dx mammogram and [place] left breast   Arianna mention to patient if pain continues let me know  Does she want to see cardiology as well?

## 2020-07-31 NOTE — Telephone Encounter (Signed)
I called to see if pt could get a sooner appt and that is the soonest. They do have [facility] which pt has to go to [facility] for mammo and [place]. I explained that to pt. I also asked pt if she wanted to go to [facility] pt stated she will stay where she's at.

## 2020-07-31 NOTE — Telephone Encounter (Signed)
Pt asked if Dr Aundra Dubin would call her something in for sinus and coughing, pt went to urgent care. I advised that Dr Aundra Dubin would probably not call anything in without seeing her. I offered to sch pt on 11/17 pt refused and said she would ride it out. I wanted to let you know.

## 2020-08-01 NOTE — Telephone Encounter (Signed)
Noted  

## 2020-08-02 DIAGNOSIS — J209 Acute bronchitis, unspecified: Secondary | ICD-10-CM | POA: Diagnosis not present

## 2020-08-02 DIAGNOSIS — J0140 Acute pansinusitis, unspecified: Secondary | ICD-10-CM | POA: Diagnosis not present

## 2020-08-03 ENCOUNTER — Telehealth: Payer: Medicare PPO | Admitting: Internal Medicine

## 2020-08-03 ENCOUNTER — Other Ambulatory Visit: Payer: Self-pay

## 2020-08-07 NOTE — Telephone Encounter (Signed)
Patient states she went to urgent care a second time and they gave her an antibiotic for bronchitis and sinus infections. Patient states she is feeling much better.

## 2020-08-07 NOTE — Telephone Encounter (Signed)
How is pt feeling?  If not well and wants Abx agreeable consultation fee ?  I like multivitamin  Warm tea honey and lemon Mucinex DM green label  Allegra, claritin or zyrtec  Nasal saline  Flonase  Tylenol as needed sinus pressure   Then if not better ok for antibiotic  Glad no flu or covid

## 2020-08-21 ENCOUNTER — Ambulatory Visit: Payer: Medicare PPO

## 2020-08-21 ENCOUNTER — Ambulatory Visit
Admission: RE | Admit: 2020-08-21 | Discharge: 2020-08-21 | Disposition: A | Discharge status: Home / Self Care | Payer: Medicare PPO | Source: Ambulatory Visit | Attending: Internal Medicine | Admitting: Internal Medicine

## 2020-08-21 ENCOUNTER — Other Ambulatory Visit: Payer: Self-pay

## 2020-08-21 DIAGNOSIS — N644 Mastodynia: Secondary | ICD-10-CM

## 2020-08-21 DIAGNOSIS — R922 Inconclusive mammogram: Secondary | ICD-10-CM | POA: Diagnosis not present

## 2020-08-24 ENCOUNTER — Telehealth: Payer: Self-pay | Admitting: Internal Medicine

## 2020-08-24 NOTE — Telephone Encounter (Signed)
Patient would like to get her covid booster, but she knows that Dr. Olivia Mackie would like her to have a ultrasound. She heard that she can not have a ultrasound until 6 weeks after booster. Please advise.

## 2020-08-24 NOTE — Telephone Encounter (Signed)
Please advise   Did not see any imaging orders in chart

## 2020-08-24 NOTE — Telephone Encounter (Signed)
Can have booster  If having left breast pain rec breast surgeon let me know

## 2020-08-24 NOTE — Telephone Encounter (Signed)
Patient states she is still having breast pain on and off. Is agreeable to seeing a specialist. Informed th Patient to wait until seeing the specialist to see what imaging they want before getting the booster.  Patient states that if she needs to wait to get a booster that she needs a note saying that she needs to wait. The sot is a requirement for her job.  Please advise

## 2020-08-28 ENCOUNTER — Telehealth: Payer: Self-pay | Admitting: Internal Medicine

## 2020-08-28 NOTE — Telephone Encounter (Signed)
Patient called in checking paperwork

## 2020-08-29 NOTE — Telephone Encounter (Signed)
Me to McLean-Scocuzza, Nino Glow, MD       08/24/20 4:21 PM Note Patient states she is still having breast pain on and off. Is agreeable to seeing a specialist. Informed th Patient to wait until seeing the specialist to see what imaging they want before getting the booster.  Patient states that if she needs to wait to get a booster that she needs a note saying that she needs to wait. The sot is a requirement for her job.  Please advise        Patient calling to check on letter

## 2020-08-30 ENCOUNTER — Other Ambulatory Visit: Payer: Medicare PPO

## 2020-09-02 DIAGNOSIS — Z20822 Contact with and (suspected) exposure to covid-19: Secondary | ICD-10-CM | POA: Diagnosis not present

## 2020-09-04 ENCOUNTER — Encounter: Payer: Self-pay | Admitting: Internal Medicine

## 2020-09-04 DIAGNOSIS — R0789 Other chest pain: Secondary | ICD-10-CM | POA: Insufficient documentation

## 2020-09-04 HISTORY — DX: Other chest pain: R07.89

## 2020-09-04 NOTE — Telephone Encounter (Signed)
Patient is agreeable to cardiology referral.

## 2020-09-04 NOTE — Telephone Encounter (Signed)
I think pt should also f/u with cardiology to make sure not related to her heart  Is she agreeable to referral [facility] in [place]?

## 2020-09-04 NOTE — Addendum Note (Signed)
Addended by: Orland Mustard on: 09/04/2020 07:37 AM   Modules accepted: Orders

## 2020-09-04 NOTE — Telephone Encounter (Signed)
Note on printer or in my chart  Also referred to breast surgery Dr. Barry Dienes in [facility] for further f/u breast pain

## 2020-09-04 NOTE — Telephone Encounter (Signed)
Patient informed and verbalized understanding.   States her job made her take the shot or be let go. Patient had her booster done 08/30/20, documented.  Patient will inform the breast surgeon of this when she is seen.

## 2020-09-04 NOTE — Addendum Note (Signed)
Addended by: Orland Mustard on: 09/04/2020 10:34 PM   Modules accepted: Orders

## 2020-09-04 NOTE — Telephone Encounter (Signed)
Referred [facility] they will call pt for appt

## 2020-09-06 ENCOUNTER — Other Ambulatory Visit: Payer: Self-pay

## 2020-09-06 ENCOUNTER — Ambulatory Visit: Payer: Medicare PPO | Admitting: Cardiovascular Disease

## 2020-09-06 ENCOUNTER — Encounter: Payer: Self-pay | Admitting: Cardiovascular Disease

## 2020-09-06 VITALS — BP 134/68 | HR 62 | Ht 64.0 in | Wt 120.2 lb

## 2020-09-06 DIAGNOSIS — E785 Hyperlipidemia, unspecified: Secondary | ICD-10-CM

## 2020-09-06 DIAGNOSIS — R079 Chest pain, unspecified: Secondary | ICD-10-CM

## 2020-09-06 DIAGNOSIS — R002 Palpitations: Secondary | ICD-10-CM | POA: Diagnosis not present

## 2020-09-06 NOTE — Patient Instructions (Signed)
Medication Instructions:   Your physician recommends that you continue on your current medications as directed. Please refer to the Current Medication list given to you today.  *If you need a refill on your cardiac medications before your next appointment, please call your pharmacy*   Lab Work:  None ordered  Testing/Procedures:  None ordered   Follow-Up: At [facility], you and your health needs are our priority.  As part of our continuing mission to provide you with exceptional heart care, we have created designated Provider Care Teams.  These Care Teams include your primary Cardiologist (physician) and Advanced Practice Providers (APPs -  Physician Assistants and Nurse Practitioners) who all work together to provide you with the care you need, when you need it.  We recommend signing up for the patient portal called "MyChart".  Sign up information is provided on this After Visit Summary.  MyChart is used to connect with patients for Virtual Visits (Telemedicine).  Patients are able to view lab/test results, encounter notes, upcoming appointments, etc.  Non-urgent messages can be sent to your provider as well.   To learn more about what you can do with MyChart, go to [email or web address].    Your next appointment:    Follow up as needed   

## 2020-09-06 NOTE — Progress Notes (Signed)
Cardiology Office Note   Date:  09/06/2020   ID:  Katie [name], Katie [name] [birth year], MRN [id number]  PCP:  McLean-Scocuzza, Nino Glow, MD  Cardiologist:   Kathlyn Sacramento, MD   Chief Complaint  Patient presents with  . New Patient (Initial Visit)    Ref by Dr. Terese Door for chest pain. Patient c/o soreness in left breast, chest pain and palpitations.       History of Present Illness: Katie [name] is a 74 y.o. female who was referred by Dr. Terese Door for evaluation of pain in the left breast area.  The patient has no prior cardiac history.  She has known history of hyperlipidemia, palpitations controlled with propranolol, previous bilateral breast surgery, previous cholecystectomy and GERD. She reports soreness in the left breast over the last few months.  The area is tender to touch but there is no skin discoloration or warmth sensation.  No fevers or chills.  The pain happens at rest and is not related to physical activities.  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  She really denies any exertional symptoms.  She is not a smoker and has no family history of premature coronary artery disease. The patient had a mammogram done recently which was unremarkable.    Past Medical History:  Diagnosis Date  . Anxiety   . Bile duct stricture   . Breast mass, left   . Choledocholithiasis    recurrent  . GERD (gastroesophageal reflux disease)   . Hypertension   . Migraine without aura, without mention of intractable migraine without mention of status migrainosus 01/03/2014  . Rapid heart beat     Past Surgical History:  Procedure Laterality Date  . ABDOMINAL HYSTERECTOMY    . BLADDER SURGERY     x 2  . BREAST EXCISIONAL BIOPSY Right 2003   benign  . BREAST LUMPECTOMY     right  . BREAST LUMPECTOMY WITH RADIOACTIVE SEED LOCALIZATION Left 03/31/2017   Procedure: LEFT BREAST LUMPECTOMY WITH RADIOACTIVE SEED LOCALIZATION;  Surgeon:  Coralie Keens, MD;  Location: [place];  Service: General;  Laterality: Left;  . CHOLECYSTECTOMY    . COLONOSCOPY    . Explor Lap, Hepatoduodenostomy  07/23/11   Dr Lazarus Gowda, [facility]  . FOOT SURGERY     both feet  . GASTRIC BYPASS  05/04/2013  . PARTIAL HYSTERECTOMY    . TUBAL LIGATION    . VEIN LIGATION AND STRIPPING     left leg  . WRIST SURGERY     right     Current Outpatient Medications  Medication Sig Dispense Refill  . aspirin 81 MG tablet Take 81 mg by mouth daily.    . Biotin 1 MG CAPS Take 1 mg by mouth daily.     . CHOLECALCIFEROL PO Take by mouth.    . esomeprazole (NEXIUM) 40 MG capsule Take 1 capsule (40 mg total) by mouth daily. 30 minutes before food 90 capsule 3  . ezetimibe (ZETIA) 10 MG tablet Take 1 tablet (10 mg total) by mouth daily. 90 tablet 3  . meloxicam (MOBIC) 15 MG tablet TAKE 1 TABLET(15 MG) BY MOUTH DAILY 90 tablet 3  . Multiple Vitamin (MULTI-VITAMINS) TABS Take 1 tablet by mouth daily.     Marland Kitchen omeprazole (PRILOSEC) 40 MG capsule omeprazole 40 mg capsule,delayed release  Take 1 capsule every day by oral route.    . ondansetron (ZOFRAN) 4 MG tablet Take 1 tablet (  4 mg total) by mouth every 8 (eight) hours as needed for nausea or vomiting. 30 tablet 11  . PROLIA 60 MG/ML SOSY injection Inject into the skin every 30 (thirty) days.    . propranolol (INDERAL) 40 MG tablet Take 1 tablet (40 mg total) by mouth 2 (two) times daily. 180 tablet 3  . simvastatin (ZOCOR) 40 MG tablet Take 1 tablet (40 mg total) by mouth daily at 6 PM. Note reduced dose 90 tablet 3  . ursodiol (ACTIGALL) 250 MG tablet TAKE 1 TABLET(250 MG) BY MOUTH THREE TIMES DAILY 270 tablet 3  . venlafaxine (EFFEXOR) 37.5 MG tablet TAKE 2 TABLETS BY MOUTH EVERY MORNING AND 1 TABLET EVERY EVENING 270 tablet 3   No current facility-administered medications for this visit.    Allergies:   Lipitor [atorvastatin calcium]    Social History:  The patient  reports that she has never smoked. She has never  used smokeless tobacco. She reports that she does not drink alcohol and does not use drugs.   Family History:  The patient's family history includes Alcohol abuse in her father; Anxiety disorder in her sister; Cancer in her mother; Depression in her sister; GER disease in her brother and sister; Hypertension in her sister.    ROS:  Please see the history of present illness.   Otherwise, review of systems are positive for none.   All other systems are reviewed and negative.    PHYSICAL EXAM: VS:  BP 134/68 (BP Location: Right Arm, Patient Position: Sitting, Cuff Size: Normal)   Pulse 62   Ht 5\' 4"  (1.626 m)   Wt 120 lb 4 oz (54.5 kg)   SpO2 99%   BMI 20.64 kg/m  , BMI Body mass index is 20.64 kg/m. GEN: Well nourished, well developed, in no acute distress  HEENT: normal  Neck: no JVD, carotid bruits, or masses Cardiac: RRR; no murmurs, rubs, or gallops,no edema.  There is clear tenderness in the left breast area. Respiratory:  clear to auscultation bilaterally, normal work of breathing GI: soft, nontender, nondistended, + BS MS: no deformity or atrophy  Skin: warm and dry, no rash Neuro:  Strength and sensation are intact Psych: euthymic mood, full affect   EKG:  EKG is ordered today. The ekg ordered today demonstrates normal sinus rhythm with no significant ST or T wave changes.  No Q waves.   Recent Labs: 06/23/2020: ALT 11; BUN 14; Creat 0.80; Hemoglobin 13.9; Platelets 275; Potassium 4.6; Sodium 139; TSH 3.22    Lipid Panel    Component Value Date/Time   CHOL 214 (H) 06/23/2020 1523   TRIG 153 (H) 06/23/2020 1523   HDL 76 06/23/2020 1523   CHOLHDL 2.8 06/23/2020 1523   VLDL 27.2 06/23/2019 0843   LDLCALC 111 (H) 06/23/2020 1523   LDLDIRECT 144.7 08/26/2014 1046      Wt Readings from Last 3 Encounters:  09/06/20 120 lb 4 oz (54.5 kg)  06/21/20 121 lb 9.6 oz (55.2 kg)  05/05/20 116 lb (52.6 kg)       PAD Screen 09/06/2020  Previous PAD dx? No  Previous  surgical procedure? No  Pain with walking? No  Feet/toe relief with dangling? No  Painful, non-healing ulcers? No  Extremities discolored? No      ASSESSMENT AND PLAN:  1.  Left breast pain does not seem to be cardiac in etiology.  There is clear tenderness in the left breast area and she has no exertional symptoms whatsoever even at  high workload.  Her cardiac exam is unremarkable and baseline EKG is normal.  I do not recommend further cardiac evaluation at this time.  2.  Palpitations: Seems to be well controlled with propranolol.  3.  Hyperlipidemia: Currently on simvastatin with most recent LDL of 111.    Disposition:   FU with me as needed.  Signed,  Kathlyn Sacramento, MD  09/06/2020 2:14 PM    [facility]

## 2020-09-14 DIAGNOSIS — M810 Age-related osteoporosis without current pathological fracture: Secondary | ICD-10-CM | POA: Diagnosis not present

## 2020-09-14 DIAGNOSIS — Z03818 Encounter for observation for suspected exposure to other biological agents ruled out: Secondary | ICD-10-CM | POA: Diagnosis not present

## 2020-09-14 DIAGNOSIS — Z1152 Encounter for screening for COVID-19: Secondary | ICD-10-CM | POA: Diagnosis not present

## 2020-09-30 DIAGNOSIS — Z20822 Contact with and (suspected) exposure to covid-19: Secondary | ICD-10-CM | POA: Diagnosis not present

## 2020-10-10 ENCOUNTER — Encounter: Payer: Self-pay | Admitting: Internal Medicine

## 2020-10-10 ENCOUNTER — Telehealth (INDEPENDENT_AMBULATORY_CARE_PROVIDER_SITE_OTHER): Payer: Medicare PPO | Admitting: Internal Medicine

## 2020-10-10 ENCOUNTER — Telehealth: Payer: Self-pay | Admitting: Internal Medicine

## 2020-10-10 ENCOUNTER — Other Ambulatory Visit: Payer: Self-pay

## 2020-10-10 VITALS — Ht 64.0 in | Wt 115.0 lb

## 2020-10-10 DIAGNOSIS — J329 Chronic sinusitis, unspecified: Secondary | ICD-10-CM | POA: Diagnosis not present

## 2020-10-10 DIAGNOSIS — U071 COVID-19: Secondary | ICD-10-CM

## 2020-10-10 MED ORDER — DEXAMETHASONE 6 MG PO TABS: 6.0000 mg | ORAL_TABLET | Freq: Every day | ORAL | 0 refills | Status: DC

## 2020-10-10 MED ORDER — AZITHROMYCIN 250 MG PO TABS: ORAL_TABLET | ORAL | 0 refills | Status: DC

## 2020-10-10 NOTE — Telephone Encounter (Signed)
Patient scheduled for 10/13/20 at 2:30 and was given instructions to drive around back then call the number on the [employer].   Patient's letter did not come through on mychart. Sent Patient a Pharmacist, community message with the letter as an attachment. Patient confirmed that she did get this and was able to pen the letter.

## 2020-10-10 NOTE — Telephone Encounter (Signed)
Pls sch covid 19 test 10/13/20  thanks

## 2020-10-10 NOTE — Patient Instructions (Signed)
There is no medication other than over the counter meds:  Mucinex dm green label for cough.  Vitamin C 1000 mg daily.  Vitamin D3 4000 Iu (units) daily.  Zinc 100 mg daily.  Quercetin 250-500 mg 2 times per day   Elderberry  Oil of oregano  cepacol or chloroseptic spray  Warm tea with honey and lemon  Warm salt water gargles Hydration  Try to eat though you dont feel like it   Tylenol or Advil  Nasal saline  Flonase  allegra/claritin or zyrtec   Monitor pulse oximeter, buy from [facility] if oxygen is less than 90 please go to the hospital.        Are you feeling really sick? Shortness of breath, cough, chest pain?, dizziness? Confusion   If so let me know  If worsening, go to hospital or [facility] clinic Urgent care for further treatment

## 2020-10-10 NOTE — Progress Notes (Signed)
Patient states she is having chills and flushed feeling. Tested positive for COVID this past Saturday.  Patient also having headache, congestion, sore throat, cough, weakness, and sinus pressure.

## 2020-10-10 NOTE — Progress Notes (Signed)
Virtual Visit via Video Note  I connected with Katie [name]  on 10/10/20 at  7:45 AM EST by a video enabled telemedicine application and verified that I am speaking with the correct person using two identifiers.  Location patient: home, [place] Location provider:work or home office Persons participating in the virtual visit: patient, provider  I discussed the limitations of evaluation and management by telemedicine and the availability of in person appointments. The patient expressed understanding and agreed to proceed.   HPI: Sick visit +covid 19 1/15, 10/07/20 w/o fever felt chills, flushed, h/a comes and goes, cough comes and goes productive and clear getting better and having sore throat, cough, sinus pressure is the worse tried Dayquil, Tylelnol w/o help tried Flonase Feeling 70% better   Left breast surgery consult sch 10/19/20 Dr. Ninfa Linden [facility]  ROS: See pertinent positives and negatives per HPI.  Past Medical History:  Diagnosis Date  . Anxiety   . Bile duct stricture   . Breast mass, left   . Choledocholithiasis    recurrent  . GERD (gastroesophageal reflux disease)   . Hypertension   . Migraine without aura, without mention of intractable migraine without mention of status migrainosus 01/03/2014  . Rapid heart beat     Past Surgical History:  Procedure Laterality Date  . ABDOMINAL HYSTERECTOMY    . BLADDER SURGERY     x 2  . BREAST EXCISIONAL BIOPSY Right 2003   benign  . BREAST LUMPECTOMY     right  . BREAST LUMPECTOMY WITH RADIOACTIVE SEED LOCALIZATION Left 03/31/2017   Procedure: LEFT BREAST LUMPECTOMY WITH RADIOACTIVE SEED LOCALIZATION;  Surgeon: Coralie Keens, MD;  Location: [place];  Service: General;  Laterality: Left;  . CHOLECYSTECTOMY    . COLONOSCOPY    . Explor Lap, Hepatoduodenostomy  07/23/11   Dr Lazarus Gowda, [facility]  . FOOT SURGERY     both feet  . GASTRIC BYPASS  05/04/2013  . PARTIAL HYSTERECTOMY    . TUBAL LIGATION    . VEIN LIGATION AND STRIPPING     left  leg  . WRIST SURGERY     right     Current Outpatient Medications:  .  aspirin 81 MG tablet, Take 81 mg by mouth daily., Disp: , Rfl:  .  azithromycin (ZITHROMAX) 250 MG tablet, 2 pills day 1 and 1 pill day 2-5 with food, Disp: 6 tablet, Rfl: 0 .  Biotin 1 MG CAPS, Take 1 mg by mouth daily. , Disp: , Rfl:  .  CHOLECALCIFEROL PO, Take by mouth., Disp: , Rfl:  .  dexamethasone (DECADRON) 6 MG tablet, Take 1 tablet (6 mg total) by mouth daily. X 5-10 days with food in the am, Disp: 10 tablet, Rfl: 0 .  esomeprazole (NEXIUM) 40 MG capsule, Take 1 capsule (40 mg total) by mouth daily. 30 minutes before food, Disp: 90 capsule, Rfl: 3 .  ezetimibe (ZETIA) 10 MG tablet, Take 1 tablet (10 mg total) by mouth daily., Disp: 90 tablet, Rfl: 3 .  Multiple Vitamin (MULTI-VITAMINS) TABS, Take 1 tablet by mouth daily. , Disp: , Rfl:  .  omeprazole (PRILOSEC) 40 MG capsule, omeprazole 40 mg capsule,delayed release  Take 1 capsule every day by oral route., Disp: , Rfl:  .  ondansetron (ZOFRAN) 4 MG tablet, Take 1 tablet (4 mg total) by mouth every 8 (eight) hours as needed for nausea or vomiting., Disp: 30 tablet, Rfl: 11 .  PROLIA 60 MG/ML SOSY injection, Inject into the skin every 30 (thirty)  days., Disp: , Rfl:  .  propranolol (INDERAL) 40 MG tablet, Take 1 tablet (40 mg total) by mouth 2 (two) times daily., Disp: 180 tablet, Rfl: 3 .  simvastatin (ZOCOR) 40 MG tablet, Take 1 tablet (40 mg total) by mouth daily at 6 PM. Note reduced dose, Disp: 90 tablet, Rfl: 3 .  ursodiol (ACTIGALL) 250 MG tablet, TAKE 1 TABLET(250 MG) BY MOUTH THREE TIMES DAILY, Disp: 270 tablet, Rfl: 3 .  venlafaxine (EFFEXOR) 37.5 MG tablet, TAKE 2 TABLETS BY MOUTH EVERY MORNING AND 1 TABLET EVERY EVENING, Disp: 270 tablet, Rfl: 3 .  meloxicam (MOBIC) 15 MG tablet, TAKE 1 TABLET(15 MG) BY MOUTH DAILY (Patient not taking: Reported on 10/10/2020), Disp: 90 tablet, Rfl: 3  EXAM:  VITALS per patient if applicable:  GENERAL: alert,  oriented, appears well and in no acute distress  HEENT: atraumatic, conjunttiva clear, no obvious abnormalities on inspection of external nose and ears  NECK: normal movements of the head and neck  LUNGS: on inspection no signs of respiratory distress, breathing rate appears normal, no obvious gross SOB, gasping or wheezing  CV: no obvious cyanosis  MS: moves all visible extremities without noticeable abnormality  PSYCH/NEURO: pleasant and cooperative, no obvious depression or anxiety, speech and thought processing grossly intact  ASSESSMENT AND PLAN:  Discussed the following assessment and plan:  COVID-19 - Plan: azithromycin (ZITHROMAX) 250 MG tablet, dexamethasone (DECADRON) 6 MG tablet  Sinusitis, unspecified chronicity, unspecified location - Plan: azithromycin (ZITHROMAX) 250 MG tablet, dexamethasone (DECADRON) 6 MG tablet There is no medication other than over the counter meds:  Mucinex dm green label for cough.  Vitamin C 1000 mg daily.  Vitamin D3 4000 Iu (units) daily.  Zinc 100 mg daily.  Quercetin 250-500 mg 2 times per day   Elderberry  Oil of oregano  cepacol or chloroseptic spray  Warm tea with honey and lemon  Hydration  Try to eat though you dont feel like it   Tylenol or Advil  Nasal saline  Flonase     Monitor pulse oximeter, buy from [place] if oxygen is less than 90 please go to the hospital.        Are you feeling really sick? Shortness of breath, cough, chest pain?, dizziness? Confusion   If so let me know  If worsening, go to hospital or [facility] clinic Urgent care for further treatment   -we discussed possible serious and likely etiologies, options for evaluation and workup, limitations of telemedicine visit vs in person visit, treatment, treatment risks and precautions.   I discussed the assessment and treatment plan with the patient. The patient was provided an opportunity to ask questions and all were answered. The patient agreed with the plan  and demonstrated an understanding of the instructions.    Time spent 20 min Delorise Jackson, MD

## 2020-10-13 ENCOUNTER — Other Ambulatory Visit: Payer: Medicare PPO

## 2020-10-13 DIAGNOSIS — Z20822 Contact with and (suspected) exposure to covid-19: Secondary | ICD-10-CM | POA: Diagnosis not present

## 2020-10-13 DIAGNOSIS — Z03818 Encounter for observation for suspected exposure to other biological agents ruled out: Secondary | ICD-10-CM | POA: Diagnosis not present

## 2020-10-19 DIAGNOSIS — N644 Mastodynia: Secondary | ICD-10-CM | POA: Diagnosis not present

## 2020-11-01 ENCOUNTER — Other Ambulatory Visit: Payer: Self-pay | Admitting: Surgery

## 2020-11-01 DIAGNOSIS — N644 Mastodynia: Secondary | ICD-10-CM

## 2020-11-28 ENCOUNTER — Ambulatory Visit
Admission: RE | Admit: 2020-11-28 | Discharge: 2020-11-28 | Disposition: A | Discharge status: Home / Self Care | Payer: Medicare PPO | Source: Ambulatory Visit | Attending: Surgery | Admitting: Surgery

## 2020-11-28 ENCOUNTER — Other Ambulatory Visit: Payer: Self-pay

## 2020-11-28 DIAGNOSIS — N644 Mastodynia: Secondary | ICD-10-CM | POA: Diagnosis not present

## 2020-11-28 MED ORDER — GADOBUTROL 1 MMOL/ML IV SOLN
6.0000 mL | Freq: Once | INTRAVENOUS | Status: AC | PRN
  Administered 2020-11-28: 6 mL via INTRAVENOUS

## 2020-12-06 DIAGNOSIS — M810 Age-related osteoporosis without current pathological fracture: Secondary | ICD-10-CM | POA: Diagnosis not present

## 2020-12-13 DIAGNOSIS — M810 Age-related osteoporosis without current pathological fracture: Secondary | ICD-10-CM | POA: Diagnosis not present

## 2020-12-22 ENCOUNTER — Other Ambulatory Visit: Payer: Self-pay | Admitting: Internal Medicine

## 2020-12-22 ENCOUNTER — Other Ambulatory Visit: Payer: Self-pay

## 2020-12-22 ENCOUNTER — Ambulatory Visit: Payer: Medicare PPO | Admitting: Internal Medicine

## 2020-12-22 ENCOUNTER — Encounter: Payer: Self-pay | Admitting: Internal Medicine

## 2020-12-22 VITALS — BP 120/74 | HR 53 | Temp 98.1°F | Ht 64.0 in | Wt 119.8 lb

## 2020-12-22 DIAGNOSIS — N3000 Acute cystitis without hematuria: Secondary | ICD-10-CM | POA: Diagnosis not present

## 2020-12-22 DIAGNOSIS — F339 Major depressive disorder, recurrent, unspecified: Secondary | ICD-10-CM | POA: Diagnosis not present

## 2020-12-22 DIAGNOSIS — E611 Iron deficiency: Secondary | ICD-10-CM

## 2020-12-22 DIAGNOSIS — K219 Gastro-esophageal reflux disease without esophagitis: Secondary | ICD-10-CM | POA: Diagnosis not present

## 2020-12-22 DIAGNOSIS — Z0000 Encounter for general adult medical examination without abnormal findings: Secondary | ICD-10-CM

## 2020-12-22 DIAGNOSIS — E785 Hyperlipidemia, unspecified: Secondary | ICD-10-CM

## 2020-12-22 DIAGNOSIS — Z1231 Encounter for screening mammogram for malignant neoplasm of breast: Secondary | ICD-10-CM | POA: Diagnosis not present

## 2020-12-22 DIAGNOSIS — R350 Frequency of micturition: Secondary | ICD-10-CM

## 2020-12-22 DIAGNOSIS — K8020 Calculus of gallbladder without cholecystitis without obstruction: Secondary | ICD-10-CM

## 2020-12-22 DIAGNOSIS — G250 Essential tremor: Secondary | ICD-10-CM

## 2020-12-22 LAB — LIPID PANEL
Cholesterol: 194 mg/dL (ref 0–200)
HDL: 68.2 mg/dL (ref >39.00)
LDL Cholesterol: 92 mg/dL (ref 0–99)
NonHDL: 126.11
Total CHOL/HDL Ratio: 3
Triglycerides: 172 mg/dL — ABNORMAL HIGH (ref 0.0–149.0)
VLDL: 34.4 mg/dL (ref 0.0–40.0)

## 2020-12-22 LAB — COMPREHENSIVE METABOLIC PANEL
ALT: 13 U/L (ref 0–35)
AST: 13 U/L (ref 0–37)
Albumin: 4.2 g/dL (ref 3.5–5.2)
Alkaline Phosphatase: 101 U/L (ref 39–117)
BUN: 13 mg/dL (ref 6–23)
CO2: 30 mEq/L (ref 19–32)
Calcium: 8.3 mg/dL — ABNORMAL LOW (ref 8.4–10.5)
Chloride: 105 mEq/L (ref 96–112)
Creatinine, Ser: 0.57 mg/dL (ref 0.40–1.20)
GFR: 90.08 mL/min (ref >60.00)
Glucose, Bld: 90 mg/dL (ref 70–99)
Potassium: 4.1 mEq/L (ref 3.5–5.1)
Sodium: 139 mEq/L (ref 135–145)
Total Bilirubin: 0.6 mg/dL (ref 0.2–1.2)
Total Protein: 6.4 g/dL (ref 6.0–8.3)

## 2020-12-22 LAB — CBC WITH DIFFERENTIAL/PLATELET
Basophils Absolute: 0 10*3/uL (ref 0.0–0.1)
Basophils Relative: 0.8 % (ref 0.0–3.0)
Eosinophils Absolute: 0.2 10*3/uL (ref 0.0–0.7)
Eosinophils Relative: 4.1 % (ref 0.0–5.0)
HCT: 37.8 % (ref 36.0–46.0)
Hemoglobin: 12.9 g/dL (ref 12.0–15.0)
Lymphocytes Relative: 23.6 % (ref 12.0–46.0)
Lymphs Abs: 0.9 10*3/uL (ref 0.7–4.0)
MCHC: 34.1 g/dL (ref 30.0–36.0)
MCV: 93.4 fl (ref 78.0–100.0)
Monocytes Absolute: 0.3 10*3/uL (ref 0.1–1.0)
Monocytes Relative: 7.9 % (ref 3.0–12.0)
Neutro Abs: 2.5 10*3/uL (ref 1.4–7.7)
Neutrophils Relative %: 63.6 % (ref 43.0–77.0)
Platelets: 219 10*3/uL (ref 150.0–400.0)
RBC: 4.05 Mil/uL (ref 3.87–5.11)
RDW: 12.7 % (ref 11.5–15.5)
WBC: 4 10*3/uL (ref 4.0–10.5)

## 2020-12-22 MED ORDER — ESOMEPRAZOLE MAGNESIUM 40 MG PO CPDR: 40.0000 mg | DELAYED_RELEASE_CAPSULE | Freq: Every day | ORAL | 3 refills | Status: DC

## 2020-12-22 MED ORDER — PROPRANOLOL HCL 40 MG PO TABS: 40.0000 mg | ORAL_TABLET | Freq: Two times a day (BID) | ORAL | 3 refills | Status: DC

## 2020-12-22 MED ORDER — EZETIMIBE 10 MG PO TABS: 10.0000 mg | ORAL_TABLET | Freq: Every day | ORAL | 3 refills | Status: DC

## 2020-12-22 MED ORDER — VENLAFAXINE HCL 37.5 MG PO TABS: ORAL_TABLET | ORAL | 3 refills | Status: DC

## 2020-12-22 MED ORDER — SIMVASTATIN 40 MG PO TABS: 40.0000 mg | ORAL_TABLET | Freq: Every day | ORAL | 3 refills | Status: DC

## 2020-12-22 MED ORDER — URSODIOL 250 MG PO TABS: ORAL_TABLET | ORAL | 3 refills | Status: DC

## 2020-12-22 NOTE — Progress Notes (Signed)
Chief Complaint  Patient presents with  . Follow-up   F/u doing well  1. C/o increased urinary freq and b/l lower back pain c/w uti and also c/w normal body temp 96/97 but has been higher 98.6   Review of Systems  Constitutional: Negative for weight loss.  HENT: Negative for hearing loss.   Eyes: Negative for blurred vision.  Respiratory: Negative for shortness of breath.   Cardiovascular: Negative for chest pain.  Gastrointestinal: Negative for abdominal pain.  Genitourinary: Positive for flank pain and frequency. Negative for dysuria.  Musculoskeletal: Negative for joint pain.  Skin: Negative for rash.  Neurological: Negative for headaches.  Psychiatric/Behavioral: Negative for depression and memory loss.   Past Medical History:  Diagnosis Date  . Anxiety   . Bile duct stricture   . Breast mass, left   . Choledocholithiasis    recurrent  . COVID-19    09/30/20, 10/07/20   . GERD (gastroesophageal reflux disease)   . Hypertension   . Migraine without aura, without mention of intractable migraine without mention of status migrainosus 01/03/2014  . Rapid heart beat    Past Surgical History:  Procedure Laterality Date  . ABDOMINAL HYSTERECTOMY    . BLADDER SURGERY     x 2  . BREAST EXCISIONAL BIOPSY Right 2003   benign  . BREAST LUMPECTOMY     right  . BREAST LUMPECTOMY WITH RADIOACTIVE SEED LOCALIZATION Left 03/31/2017   Procedure: LEFT BREAST LUMPECTOMY WITH RADIOACTIVE SEED LOCALIZATION;  Surgeon: Coralie Keens, MD;  Location: [facility];  Service: General;  Laterality: Left;  . CHOLECYSTECTOMY    . COLONOSCOPY    . Explor Lap, Hepatoduodenostomy  07/23/11   Dr Lazarus Gowda, [facility]  . FOOT SURGERY     both feet  . GASTRIC BYPASS  05/04/2013  . PARTIAL HYSTERECTOMY    . TUBAL LIGATION    . VEIN LIGATION AND STRIPPING     left leg  . WRIST SURGERY     right   Family History  Problem Relation Age of Onset  . Cancer Mother        lung, smoker  . Alcohol abuse Father   .  GER disease Sister   . GER disease Brother   . Depression Sister   . Anxiety disorder Sister   . Hypertension Sister   . Migraines Neg Hx    Social History   Socioeconomic History  . Marital status: Married    Spouse name: Not on file  . Number of children: 2  . Years of education: college  . Highest education level: Not on file  Occupational History  . Occupation: retired    Fish farm manager: RETIRED  Tobacco Use  . Smoking status: Never Smoker  . Smokeless tobacco: Never Used  Vaping Use  . Vaping Use: Never used  Substance and Sexual Activity  . Alcohol use: No  . Drug use: No  . Sexual activity: Yes  Other Topics Concern  . Not on file  Social History Narrative   Does not have a living will or HPOA.   Desires CPR.   Does not want prolonged life support.   Social Determinants of Health   Financial Resource Strain: Low Risk   . Difficulty of Paying Living Expenses: Not hard at all  Food Insecurity: No Food Insecurity  . Worried About Charity fundraiser in the Last Year: Never true  . Ran Out of Food in the Last Year: Never true  Transportation Needs: No Transportation Needs  .  Lack of Transportation (Medical): No  . Lack of Transportation (Non-Medical): No  Physical Activity: Not on file  Stress: No Stress Concern Present  . Feeling of Stress : Not at all  Social Connections: Unknown  . Frequency of Communication with Friends and Family: More than three times a week  . Frequency of Social Gatherings with Friends and Family: More than three times a week  . Attends Religious Services: Not on file  . Active Member of Clubs or Organizations: Not on file  . Attends Archivist Meetings: Not on file  . Marital Status: Not on file  Intimate Partner Violence: Not At Risk  . Fear of Current or Ex-Partner: No  . Emotionally Abused: No  . Physically Abused: No  . Sexually Abused: No   Current Meds  Medication Sig  . aspirin 81 MG tablet Take 81 mg by mouth  daily.  . Biotin 1 MG CAPS Take 1 mg by mouth daily.   . CHOLECALCIFEROL PO Take by mouth.  . Multiple Vitamin (MULTI-VITAMINS) TABS Take 1 tablet by mouth daily.   Marland Kitchen omeprazole (PRILOSEC) 40 MG capsule omeprazole 40 mg capsule,delayed release  Take 1 capsule every day by oral route.  . ondansetron (ZOFRAN) 4 MG tablet Take 1 tablet (4 mg total) by mouth every 8 (eight) hours as needed for nausea or vomiting.  Marland Kitchen PROLIA 60 MG/ML SOSY injection Inject into the skin every 30 (thirty) days.  . [DISCONTINUED] esomeprazole (NEXIUM) 40 MG capsule Take 1 capsule (40 mg total) by mouth daily. 30 minutes before food  . [DISCONTINUED] ezetimibe (ZETIA) 10 MG tablet Take 1 tablet (10 mg total) by mouth daily.  . [DISCONTINUED] propranolol (INDERAL) 40 MG tablet Take 1 tablet (40 mg total) by mouth 2 (two) times daily.  . [DISCONTINUED] simvastatin (ZOCOR) 40 MG tablet Take 1 tablet (40 mg total) by mouth daily at 6 PM. Note reduced dose  . [DISCONTINUED] ursodiol (ACTIGALL) 250 MG tablet TAKE 1 TABLET(250 MG) BY MOUTH THREE TIMES DAILY  . [DISCONTINUED] venlafaxine (EFFEXOR) 37.5 MG tablet TAKE 2 TABLETS BY MOUTH EVERY MORNING AND 1 TABLET EVERY EVENING   Allergies  Allergen Reactions  . Lipitor [Atorvastatin Calcium]     Joint pain   No results found for this or any previous visit (from the past 2160 hour(s)). Objective  Body mass index is 20.56 kg/m. Wt Readings from Last 3 Encounters:  12/22/20 119 lb 12.8 oz (54.3 kg)  10/10/20 115 lb (52.2 kg)  09/06/20 120 lb 4 oz (54.5 kg)   Temp Readings from Last 3 Encounters:  12/22/20 98.1 F (36.7 C) (Oral)  06/21/20 98.2 F (36.8 C)  11/24/19 (!) 97.4 F (36.3 C) (Temporal)   BP Readings from Last 3 Encounters:  12/22/20 120/74  09/06/20 134/68  06/21/20 122/70   Pulse Readings from Last 3 Encounters:  12/22/20 (!) 53  09/06/20 62  06/21/20 (!) 57    Physical Exam Vitals and nursing note reviewed.  Constitutional:      Appearance:  Normal appearance. She is well-developed and well-groomed.  HENT:     Head: Normocephalic and atraumatic.  Cardiovascular:     Rate and Rhythm: Normal rate and regular rhythm.     Heart sounds: Normal heart sounds. No murmur heard.   Pulmonary:     Effort: Pulmonary effort is normal.     Breath sounds: Normal breath sounds.  Abdominal:     Tenderness: There is no abdominal tenderness.  Skin:  General: Skin is warm and dry.  Neurological:     General: No focal deficit present.     Mental Status: She is alert and oriented to person, place, and time. Mental status is at baseline.     Gait: Gait normal.  Psychiatric:        Attention and Perception: Attention and perception normal.        Mood and Affect: Mood and affect normal.        Speech: Speech normal.        Behavior: Behavior normal. Behavior is cooperative.        Thought Content: Thought content normal.        Cognition and Memory: Cognition and memory normal.        Judgment: Judgment normal.     Assessment  Plan  Annual physical exam Flu shotutd will get [place] 2021 Tdap, shingrix vaccine sent to pharmacy overdue  Immune MMR moderna 3/3 +covid 09/2020   mammo6/21/21 negordered 02/2021  left breast pain breast mri 11/28/20 normal  Pap s/p hysterectomy for prolapse ovaries still intact pap 03/15/15 neg -of note menopause age 89 yo started cycle age 62 y.o   colonoscopy had 04/16/13 tubular adenoma x 1 referred [facility] today Dr. Tillie Rung and f/u other GI issues s/p gastric bypass 2014 see HPI -colonoscopy 05/20/18 negative polyps no bx f/u in 5 years  DEXA 02/15/2019 due repeat bone density discuss with Dr. Matthew Saras ob/g[facility]  HCV neg 04/20/11   F/u with dermatology yearly Dr. Rolm Bookbinder [place] due h/o NMSC nose -dermatology 10/11 2019 negative tbse, seen 2020 f/u in 1 year upcoming due 02/2021    [facility] appt 06/17/2019 DEXA ordered last scan early osteoporosis  rec healthy diet and  exercise   Iron deficiency She is taking mvt with iron and iron recheck   Hyperlipidemia, unspecified hyperlipidemia type - Plan: Comprehensive metabolic panel, Lipid panel, ezetimibe (ZETIA) 10 MG tablet, simvastatin (ZOCOR) 40 MG tablet  Urine frequency - Plan: Urinalysis, Routine w reflex microscopic, CBC w/Diff, Urine Culture Acute cystitis without hematuria - Plan: Urinalysis, Routine w reflex microscopic, CBC w/Diff, Urine Culture  Gastroesophageal reflux disease without esophagitis - Plan: esomeprazole (NEXIUM) 40 MG capsule  Benign head tremor - Plan: propranolol (INDERAL) 40 MG tablet  Episode of recurrent major depressive disorder, unspecified depression episode severity ([place]) - Plan: venlafaxine (EFFEXOR) 37.5 MG tablet  Gallstones - Plan: ursodiol (ACTIGALL) 250 MG tablet   Provider: Dr. Olivia Mackie McLean-Scocuzza-[facility] Medicine

## 2020-12-22 NOTE — Patient Instructions (Addendum)
Tylenol  Petra Kuba made tumeric or Petra Kuba wise tumeric  Arthritis gloves or medical supply store voltaren    Bone density due 02/2021 please have them fax report [phone]  Mammogram due 03/06/21

## 2020-12-23 LAB — URINALYSIS, ROUTINE W REFLEX MICROSCOPIC
Bilirubin, UA: NEGATIVE
Glucose, UA: NEGATIVE
Ketones, UA: NEGATIVE
Leukocytes,UA: NEGATIVE
Nitrite, UA: NEGATIVE
Protein,UA: NEGATIVE
RBC, UA: NEGATIVE
Specific Gravity, UA: 1.017 (ref 1.005–1.030)
Urobilinogen, Ur: 1 mg/dL (ref 0.2–1.0)
pH, UA: 8.5 — ABNORMAL HIGH (ref 5.0–7.5)

## 2020-12-23 LAB — IRON,TIBC AND FERRITIN PANEL
%SAT: 22 % (calc) (ref 16–45)
Ferritin: 22 ng/mL (ref 16–288)
Iron: 78 ug/dL (ref 45–160)
TIBC: 359 mcg/dL (calc) (ref 250–450)

## 2020-12-24 LAB — URINE CULTURE: Organism ID, Bacteria: NO GROWTH

## 2020-12-26 DIAGNOSIS — B349 Viral infection, unspecified: Secondary | ICD-10-CM | POA: Diagnosis not present

## 2021-01-02 ENCOUNTER — Other Ambulatory Visit: Payer: Self-pay

## 2021-01-02 ENCOUNTER — Encounter: Payer: Self-pay | Admitting: Internal Medicine

## 2021-01-02 ENCOUNTER — Telehealth (INDEPENDENT_AMBULATORY_CARE_PROVIDER_SITE_OTHER): Payer: Medicare PPO | Admitting: Internal Medicine

## 2021-01-02 VITALS — Ht 64.0 in | Wt 112.0 lb

## 2021-01-02 DIAGNOSIS — R0781 Pleurodynia: Secondary | ICD-10-CM

## 2021-01-02 DIAGNOSIS — R509 Fever, unspecified: Secondary | ICD-10-CM

## 2021-01-02 DIAGNOSIS — R0602 Shortness of breath: Secondary | ICD-10-CM | POA: Diagnosis not present

## 2021-01-02 DIAGNOSIS — N644 Mastodynia: Secondary | ICD-10-CM

## 2021-01-02 DIAGNOSIS — R197 Diarrhea, unspecified: Secondary | ICD-10-CM

## 2021-01-02 DIAGNOSIS — R059 Cough, unspecified: Secondary | ICD-10-CM

## 2021-01-02 DIAGNOSIS — R5383 Other fatigue: Secondary | ICD-10-CM

## 2021-01-02 HISTORY — DX: Mastodynia: N64.4

## 2021-01-02 NOTE — Progress Notes (Signed)
Patient state symptoms started with dry cough and sore throat. Patient then had weakness/fatigue, diarrhea, abdominal pian, vomiting/nausea, SOB, runny nose, and one time fever of 100.9.   Patient states she was seen at urgent care on 4/12. They gave her amoxicillin and prednisone. Patient has one dose of antibiotic left. Patient has taken two negative at home test. No one around the Patient is sick

## 2021-01-02 NOTE — Progress Notes (Signed)
Virtual Visit via Video Note  I connected with Clydette Privitera on 01/02/21 at  3:30 PM EDT by a video enabled telemedicine application and verified that I am speaking with the correct person using two identifiers.  Location patient: home, [place] Location provider:work or home office Persons participating in the virtual visit: patient, provider, pts husband  I discussed the limitations of evaluation and management by telemedicine and the availability of in person appointments. The patient expressed understanding and agreed to proceed.   HPI: Nausea and vomiting x 1 day Saturday night, diarrhea with loose stools 3x per day today 1x, abdominal pain, also productive cough and congestion with phelgm, sore throat and hoarse voice improved, sob with exertion, fatigue, fever 100.9, runny nose tried Amoxicillin and prednisone given via [facility] 12/26/20. She is also c/o left rib pain 2 home covid tests negative. She also has low appetite tried to eat applesauce, jello, deviled eggs  -COVID-19 vaccine status: 3/3   ROS: See pertinent positives and negatives per HPI.  Past Medical History:  Diagnosis Date  . Anxiety   . Bile duct stricture   . Breast mass, left   . Choledocholithiasis    recurrent  . COVID-19    09/30/20, 10/07/20   . GERD (gastroesophageal reflux disease)   . Hypertension   . Migraine without aura, without mention of intractable migraine without mention of status migrainosus 01/03/2014  . Rapid heart beat     Past Surgical History:  Procedure Laterality Date  . ABDOMINAL HYSTERECTOMY    . BLADDER SURGERY     x 2  . BREAST EXCISIONAL BIOPSY Right 2003   benign  . BREAST LUMPECTOMY     right  . BREAST LUMPECTOMY WITH RADIOACTIVE SEED LOCALIZATION Left 03/31/2017   Procedure: LEFT BREAST LUMPECTOMY WITH RADIOACTIVE SEED LOCALIZATION;  Surgeon: Coralie Keens, MD;  Location: [facility];  Service: General;  Laterality: Left;  . CHOLECYSTECTOMY    . COLONOSCOPY    . Explor Lap,  Hepatoduodenostomy  07/23/11   Dr Lazarus Gowda, [facility]  . FOOT SURGERY     both feet  . GASTRIC BYPASS  05/04/2013  . PARTIAL HYSTERECTOMY    . TUBAL LIGATION    . VEIN LIGATION AND STRIPPING     left leg  . WRIST SURGERY     right     Current Outpatient Medications:  .  aspirin 81 MG tablet, Take 81 mg by mouth daily., Disp: , Rfl:  .  Biotin 1 MG CAPS, Take 1 mg by mouth daily. , Disp: , Rfl:  .  CHOLECALCIFEROL PO, Take by mouth., Disp: , Rfl:  .  esomeprazole (NEXIUM) 40 MG capsule, Take 1 capsule (40 mg total) by mouth daily. 30 minutes before food, Disp: 90 capsule, Rfl: 3 .  ezetimibe (ZETIA) 10 MG tablet, Take 1 tablet (10 mg total) by mouth daily., Disp: 90 tablet, Rfl: 3 .  meloxicam (MOBIC) 15 MG tablet, TAKE 1 TABLET(15 MG) BY MOUTH DAILY, Disp: 90 tablet, Rfl: 3 .  Multiple Vitamin (MULTI-VITAMINS) TABS, Take 1 tablet by mouth daily. , Disp: , Rfl:  .  omeprazole (PRILOSEC) 40 MG capsule, omeprazole 40 mg capsule,delayed release  Take 1 capsule every day by oral route., Disp: , Rfl:  .  ondansetron (ZOFRAN) 4 MG tablet, Take 1 tablet (4 mg total) by mouth every 8 (eight) hours as needed for nausea or vomiting., Disp: 30 tablet, Rfl: 11 .  PROLIA 60 MG/ML SOSY injection, Inject into the skin every 30 (thirty)  days., Disp: , Rfl:  .  propranolol (INDERAL) 40 MG tablet, Take 1 tablet (40 mg total) by mouth 2 (two) times daily., Disp: 180 tablet, Rfl: 3 .  simvastatin (ZOCOR) 40 MG tablet, Take 1 tablet (40 mg total) by mouth daily at 6 PM. Note reduced dose, Disp: 90 tablet, Rfl: 3 .  ursodiol (ACTIGALL) 250 MG tablet, TAKE 1 TABLET(250 MG) BY MOUTH THREE TIMES DAILY, Disp: 270 tablet, Rfl: 3 .  venlafaxine (EFFEXOR) 37.5 MG tablet, TAKE 2 TABLETS BY MOUTH EVERY MORNING AND 1 TABLET EVERY EVENING, Disp: 270 tablet, Rfl: 3  EXAM:  VITALS per patient if applicable:  GENERAL: alert, oriented, appears well and in no acute distress  HEENT: atraumatic, conjunttiva clear, no obvious  abnormalities on inspection of external nose and ears  NECK: normal movements of the head and neck  LUNGS: on inspection no signs of respiratory distress, breathing rate appears normal, no obvious gross SOB, gasping or wheezing  CV: no obvious cyanosis  MS: moves all visible extremities without noticeable abnormality  PSYCH/NEURO: pleasant and cooperative, no obvious depression or anxiety, speech and thought processing grossly intact  ASSESSMENT AND PLAN:  Discussed the following assessment and plan:  Diarrhea, ? Viral/gastroenteritis r/o C diff just on amoxicillin - Plan: Clostridium Difficile by PCR([facility]), GI pathogen panel by PCR, stool Brat diet  peptobismol  Hydration  Let me know if not better Fatigue, fever r/o pneuemonia - Plan: DG Chest 2 View SOB (shortness of breath) - Plan: DG Chest 2 View Cough - Plan: DG Chest 2 View Fever, unspecified fever cause - Plan: DG Chest 2 View  Rib pain on left side - Plan: DG Ribs Unilateral Left Left breast pain  -she will consider f/u with surgery s/p MRI breast as still having left breast ttp   HM Flu shotutdwill get [place]2021 Tdap, shingrix vaccine sent to [facility]  Immune MMR moderna 3/3 +covid 09/2020   mammo6/21/21negordered 02/2021  left breast pain breast mri 11/28/20 normal  Pap s/p hysterectomy for prolapse ovaries still intact pap 03/15/15 neg -of note menopause age 81 yo started cycle age 77 y.o   colonoscopy had 04/16/13 tubular adenoma x 1 referred [facility] today Dr. Tillie Rung and f/u other GI issues s/p gastric bypass 2014 see HPI -colonoscopy 05/20/18 negative polyps no bx f/u in 5 years  DEXA 02/15/2019 due repeat bone density discuss with Dr. Matthew Saras ob/gyn [facility]  HCV neg 04/20/11   F/u with dermatology yearly Dr. Rolm Bookbinder [place] due h/o NMSC nose -dermatology 10/11 2019 negative tbse, seen 2016fu in 1 year upcoming due 02/2021   [facility] appt 06/17/2019 DEXA ordered  last scan early osteoporosis  rec healthy diet and exercise   -we discussed possible serious and likely etiologies, options for evaluation and workup, limitations of telemedicine visit vs in person visit, treatment, treatment risks and precautions.  I discussed the assessment and treatment plan with the patient. The patient was provided an opportunity to ask questions and all were answered. The patient agreed with the plan and demonstrated an understanding of the instructions.    Time spent 20 min TDelorise Jackson MD

## 2021-01-03 ENCOUNTER — Ambulatory Visit
Admission: RE | Admit: 2021-01-03 | Discharge: 2021-01-03 | Disposition: A | Discharge status: Home / Self Care | Payer: Medicare PPO | Attending: Internal Medicine | Admitting: Internal Medicine

## 2021-01-03 ENCOUNTER — Ambulatory Visit
Admission: RE | Admit: 2021-01-03 | Discharge: 2021-01-03 | Disposition: A | Discharge status: Home / Self Care | Payer: Medicare PPO | Source: Ambulatory Visit | Attending: Internal Medicine | Admitting: Internal Medicine

## 2021-01-03 ENCOUNTER — Other Ambulatory Visit
Admission: RE | Admit: 2021-01-03 | Discharge: 2021-01-03 | Disposition: A | Discharge status: Home / Self Care | Payer: Medicare PPO | Source: Ambulatory Visit | Attending: Internal Medicine | Admitting: Internal Medicine

## 2021-01-03 DIAGNOSIS — R509 Fever, unspecified: Secondary | ICD-10-CM

## 2021-01-03 DIAGNOSIS — R0781 Pleurodynia: Secondary | ICD-10-CM | POA: Diagnosis not present

## 2021-01-03 DIAGNOSIS — R5383 Other fatigue: Secondary | ICD-10-CM

## 2021-01-03 DIAGNOSIS — R0602 Shortness of breath: Secondary | ICD-10-CM | POA: Insufficient documentation

## 2021-01-03 DIAGNOSIS — A080 Rotaviral enteritis: Secondary | ICD-10-CM | POA: Insufficient documentation

## 2021-01-03 DIAGNOSIS — R059 Cough, unspecified: Secondary | ICD-10-CM | POA: Diagnosis not present

## 2021-01-03 DIAGNOSIS — R197 Diarrhea, unspecified: Secondary | ICD-10-CM

## 2021-01-03 LAB — GASTROINTESTINAL PANEL BY PCR, STOOL (REPLACES STOOL CULTURE)

## 2021-01-03 LAB — C DIFFICILE QUICK SCREEN W PCR REFLEX
C Diff antigen: NEGATIVE
C Diff interpretation: NOT DETECTED
C Diff toxin: NEGATIVE

## 2021-01-26 ENCOUNTER — Ambulatory Visit: Payer: Medicare PPO | Admitting: Internal Medicine

## 2021-01-26 ENCOUNTER — Other Ambulatory Visit: Payer: Self-pay

## 2021-01-26 ENCOUNTER — Encounter: Payer: Self-pay | Admitting: Internal Medicine

## 2021-01-26 VITALS — BP 122/60 | HR 57 | Temp 98.1°F | Ht 64.0 in | Wt 118.8 lb

## 2021-01-26 DIAGNOSIS — N644 Mastodynia: Secondary | ICD-10-CM

## 2021-01-26 DIAGNOSIS — H1033 Unspecified acute conjunctivitis, bilateral: Secondary | ICD-10-CM

## 2021-01-26 DIAGNOSIS — R591 Generalized enlarged lymph nodes: Secondary | ICD-10-CM | POA: Diagnosis not present

## 2021-01-26 DIAGNOSIS — R059 Cough, unspecified: Secondary | ICD-10-CM | POA: Diagnosis not present

## 2021-01-26 DIAGNOSIS — J029 Acute pharyngitis, unspecified: Secondary | ICD-10-CM

## 2021-01-26 MED ORDER — ERYTHROMYCIN 5 MG/GM OP OINT: 1.0000 "application " | TOPICAL_OINTMENT | Freq: Three times a day (TID) | OPHTHALMIC | 0 refills | Status: DC

## 2021-01-26 NOTE — Progress Notes (Signed)
Chief Complaint  Patient presents with  . Follow-up  . Breast Problem    Soreness in the left breast, ongoing    F/u  1. Cough still persistent since 12/2020 cxr negative disc could be allergies if persists GI/ENT denies h/o asthma cough is improving but not resolved  2. Left breast pain refer to Dr.  Ninfa Linden pt had MRI 11/2020 negative but wants breast bx  3. C/o sore throat at times intermittently denies h/o GERD on generic nexium 40 mg qd  4. Left eye redness and crust tried otc allergy drops    Review of Systems  Constitutional: Negative for weight loss.  HENT: Positive for sore throat.   Eyes: Negative for blurred vision.  Respiratory: Positive for cough. Negative for shortness of breath.   Cardiovascular: Negative for chest pain.  Gastrointestinal: Negative for vomiting.  Skin: Negative for rash.   Past Medical History:  Diagnosis Date  . Anxiety   . Bile duct stricture   . Breast mass, left   . Choledocholithiasis    recurrent  . COVID-19    09/30/20, 10/07/20   . GERD (gastroesophageal reflux disease)   . Hypertension   . Migraine without aura, without mention of intractable migraine without mention of status migrainosus 01/03/2014  . Rapid heart beat    Past Surgical History:  Procedure Laterality Date  . ABDOMINAL HYSTERECTOMY    . BLADDER SURGERY     x 2  . BREAST EXCISIONAL BIOPSY Right 2003   benign  . BREAST LUMPECTOMY     right  . BREAST LUMPECTOMY WITH RADIOACTIVE SEED LOCALIZATION Left 03/31/2017   Procedure: LEFT BREAST LUMPECTOMY WITH RADIOACTIVE SEED LOCALIZATION;  Surgeon: Coralie Keens, MD;  Location: [facility];  Service: General;  Laterality: Left;  . CHOLECYSTECTOMY    . COLONOSCOPY    . Explor Lap, Hepatoduodenostomy  07/23/11   Dr Lazarus Gowda, [facility]  . FOOT SURGERY     both feet  . GASTRIC BYPASS  05/04/2013  . PARTIAL HYSTERECTOMY    . TUBAL LIGATION    . VEIN LIGATION AND STRIPPING     left leg  . WRIST SURGERY     right   Family History   Problem Relation Age of Onset  . Cancer Mother        lung, smoker  . Alcohol abuse Father   . GER disease Sister   . GER disease Brother   . Depression Sister   . Anxiety disorder Sister   . Hypertension Sister   . Migraines Neg Hx    Social History   Socioeconomic History  . Marital status: Married    Spouse name: Not on file  . Number of children: 2  . Years of education: college  . Highest education level: Not on file  Occupational History  . Occupation: retired    Fish farm manager: RETIRED  Tobacco Use  . Smoking status: Never Smoker  . Smokeless tobacco: Never Used  Vaping Use  . Vaping Use: Never used  Substance and Sexual Activity  . Alcohol use: No  . Drug use: No  . Sexual activity: Yes  Other Topics Concern  . Not on file  Social History Narrative   Does not have a living will or HPOA.   Desires CPR.   Does not want prolonged life support.   Social Determinants of Health   Financial Resource Strain: Low Risk   . Difficulty of Paying Living Expenses: Not hard at all  Food Insecurity: No Food Insecurity  .  Worried About Charity fundraiser in the Last Year: Never true  . Ran Out of Food in the Last Year: Never true  Transportation Needs: No Transportation Needs  . Lack of Transportation (Medical): No  . Lack of Transportation (Non-Medical): No  Physical Activity: Not on file  Stress: No Stress Concern Present  . Feeling of Stress : Not at all  Social Connections: Unknown  . Frequency of Communication with Friends and Family: More than three times a week  . Frequency of Social Gatherings with Friends and Family: More than three times a week  . Attends Religious Services: Not on file  . Active Member of Clubs or Organizations: Not on file  . Attends Archivist Meetings: Not on file  . Marital Status: Not on file  Intimate Partner Violence: Not At Risk  . Fear of Current or Ex-Partner: No  . Emotionally Abused: No  . Physically Abused: No  .  Sexually Abused: No   Current Meds  Medication Sig  . aspirin 81 MG tablet Take 81 mg by mouth daily.  . Biotin 1 MG CAPS Take 1 mg by mouth daily.   . CHOLECALCIFEROL PO Take by mouth.  . erythromycin ophthalmic ointment Place 1 application into both eyes 3 (three) times daily. 4-7 days  . esomeprazole (NEXIUM) 40 MG capsule Take 1 capsule (40 mg total) by mouth daily. 30 minutes before food  . ezetimibe (ZETIA) 10 MG tablet Take 1 tablet (10 mg total) by mouth daily.  . meloxicam (MOBIC) 15 MG tablet TAKE 1 TABLET(15 MG) BY MOUTH DAILY  . Multiple Vitamin (MULTI-VITAMINS) TABS Take 1 tablet by mouth daily.   . ondansetron (ZOFRAN) 4 MG tablet Take 1 tablet (4 mg total) by mouth every 8 (eight) hours as needed for nausea or vomiting.  Marland Kitchen PROLIA 60 MG/ML SOSY injection Inject into the skin every 30 (thirty) days.  . propranolol (INDERAL) 40 MG tablet Take 1 tablet (40 mg total) by mouth 2 (two) times daily.  . simvastatin (ZOCOR) 40 MG tablet Take 1 tablet (40 mg total) by mouth daily at 6 PM. Note reduced dose  . ursodiol (ACTIGALL) 250 MG tablet TAKE 1 TABLET(250 MG) BY MOUTH THREE TIMES DAILY  . venlafaxine (EFFEXOR) 37.5 MG tablet TAKE 2 TABLETS BY MOUTH EVERY MORNING AND 1 TABLET EVERY EVENING   Allergies  Allergen Reactions  . Lipitor [Atorvastatin Calcium]     Joint pain   Recent Results (from the past 2160 hour(s))  Comprehensive metabolic panel     Status: Abnormal   Collection Time: 12/22/20  9:25 AM  Result Value Ref Range   Sodium 139 135 - 145 mEq/L   Potassium 4.1 3.5 - 5.1 mEq/L   Chloride 105 96 - 112 mEq/L   CO2 30 19 - 32 mEq/L   Glucose, Bld 90 70 - 99 mg/dL   BUN 13 6 - 23 mg/dL   Creatinine, Ser 0.57 0.40 - 1.20 mg/dL   Total Bilirubin 0.6 0.2 - 1.2 mg/dL   Alkaline Phosphatase 101 39 - 117 U/L   AST 13 0 - 37 U/L   ALT 13 0 - 35 U/L   Total Protein 6.4 6.0 - 8.3 g/dL   Albumin 4.2 3.5 - 5.2 g/dL   GFR 90.08 >60.00 mL/min    Comment: Calculated using the  CKD-EPI Creatinine Equation (2021)   Calcium 8.3 (L) 8.4 - 10.5 mg/dL  Lipid panel     Status: Abnormal   Collection Time:  12/22/20  9:25 AM  Result Value Ref Range   Cholesterol 194 0 - 200 mg/dL    Comment: ATP III Classification       Desirable:  < 200 mg/dL               Borderline High:  200 - 239 mg/dL          High:  > = 240 mg/dL   Triglycerides 172.0 (H) 0.0 - 149.0 mg/dL    Comment: Normal:  <150 mg/dLBorderline High:  150 - 199 mg/dL   HDL 68.20 >39.00 mg/dL   VLDL 34.4 0.0 - 40.0 mg/dL   LDL Cholesterol 92 0 - 99 mg/dL   Total CHOL/HDL Ratio 3     Comment:                Men          Women1/2 Average Risk     3.4          3.3Average Risk          5.0          4.42X Average Risk          9.6          7.13X Average Risk          15.0          11.0                       NonHDL 126.11     Comment: NOTE:  Non-HDL goal should be 30 mg/dL higher than patient's LDL goal (i.e. LDL goal of < 70 mg/dL, would have non-HDL goal of < 100 mg/dL)  Iron, TIBC and Ferritin Panel     Status: None   Collection Time: 12/22/20  9:25 AM  Result Value Ref Range   Iron 78 45 - 160 mcg/dL   TIBC 359 250 - 450 mcg/dL (calc)   %SAT 22 16 - 45 % (calc)   Ferritin 22 16 - 288 ng/mL  Urinalysis, Routine w reflex microscopic     Status: Abnormal   Collection Time: 12/22/20  9:25 AM  Result Value Ref Range   Specific Gravity, UA 1.017 1.005 - 1.030   pH, UA 8.5 (H) 5.0 - 7.5   Color, UA Yellow Yellow   Appearance Ur Clear Clear   Leukocytes,UA Negative Negative   Protein,UA Negative Negative/Trace   Glucose, UA Negative Negative   Ketones, UA Negative Negative   RBC, UA Negative Negative   Bilirubin, UA Negative Negative   Urobilinogen, Ur 1.0 0.2 - 1.0 mg/dL   Nitrite, UA Negative Negative   Microscopic Examination Comment     Comment: Microscopic not indicated and not performed.  CBC w/Diff     Status: None   Collection Time: 12/22/20  9:25 AM  Result Value Ref Range   WBC 4.0 4.0 - 10.5 K/uL    RBC 4.05 3.87 - 5.11 Mil/uL   Hemoglobin 12.9 12.0 - 15.0 g/dL   HCT 37.8 36.0 - 46.0 %   MCV 93.4 78.0 - 100.0 fl   MCHC 34.1 30.0 - 36.0 g/dL   RDW 12.7 11.5 - 15.5 %   Platelets 219.0 150.0 - 400.0 K/uL   Neutrophils Relative % 63.6 43.0 - 77.0 %   Lymphocytes Relative 23.6 12.0 - 46.0 %   Monocytes Relative 7.9 3.0 - 12.0 %   Eosinophils Relative 4.1 0.0 - 5.0 %   Basophils Relative  0.8 0.0 - 3.0 %   Neutro Abs 2.5 1.4 - 7.7 K/uL   Lymphs Abs 0.9 0.7 - 4.0 K/uL   Monocytes Absolute 0.3 0.1 - 1.0 K/uL   Eosinophils Absolute 0.2 0.0 - 0.7 K/uL   Basophils Absolute 0.0 0.0 - 0.1 K/uL  Urine Culture     Status: None   Collection Time: 12/22/20  9:25 AM   Specimen: Blood   BLD  Result Value Ref Range   Urine Culture, Routine Final report    Organism ID, Bacteria No growth   C Difficile Quick Screen w PCR reflex     Status: None   Collection Time: 01/03/21 11:00 AM   Specimen: STOOL  Result Value Ref Range   C Diff antigen NEGATIVE NEGATIVE   C Diff toxin NEGATIVE NEGATIVE   C Diff interpretation No C. difficile detected.     Comment: Performed at [facility], [address] [place], [place] [address]  Gastrointestinal Panel by PCR , Stool     Status: Abnormal   Collection Time: 01/03/21  2:00 PM  Result Value Ref Range   Campylobacter species NOT DETECTED NOT DETECTED   Plesimonas shigelloides NOT DETECTED NOT DETECTED   Salmonella species NOT DETECTED NOT DETECTED   Yersinia enterocolitica NOT DETECTED NOT DETECTED   Vibrio species NOT DETECTED NOT DETECTED   Vibrio cholerae NOT DETECTED NOT DETECTED   Enteroaggregative E coli (EAEC) NOT DETECTED NOT DETECTED   Enteropathogenic E coli (EPEC) NOT DETECTED NOT DETECTED   Enterotoxigenic E coli (ETEC) NOT DETECTED NOT DETECTED   Shiga like toxin producing E coli (STEC) NOT DETECTED NOT DETECTED   Shigella/Enteroinvasive E coli (EIEC) NOT DETECTED NOT DETECTED   Cryptosporidium NOT DETECTED NOT DETECTED    Cyclospora cayetanensis NOT DETECTED NOT DETECTED   Entamoeba histolytica NOT DETECTED NOT DETECTED   Giardia lamblia NOT DETECTED NOT DETECTED   Adenovirus F40/41 NOT DETECTED NOT DETECTED   Astrovirus NOT DETECTED NOT DETECTED   Norovirus GI/GII NOT DETECTED NOT DETECTED   Rotavirus A DETECTED (A) NOT DETECTED   Sapovirus (I, II, IV, and V) NOT DETECTED NOT DETECTED    Comment: Performed at [facility], [address] [place], [place] [address]   Objective  Body mass index is 20.39 kg/m. Wt Readings from Last 3 Encounters:  01/26/21 118 lb 12.8 oz (53.9 kg)  01/02/21 112 lb (50.8 kg)  12/22/20 119 lb 12.8 oz (54.3 kg)   Temp Readings from Last 3 Encounters:  01/26/21 98.1 F (36.7 C) (Oral)  12/22/20 98.1 F (36.7 C) (Oral)  06/21/20 98.2 F (36.8 C)   BP Readings from Last 3 Encounters:  01/26/21 122/60  12/22/20 120/74  09/06/20 134/68   Pulse Readings from Last 3 Encounters:  01/26/21 (!) 57  12/22/20 (!) 53  09/06/20 62    Physical Exam Vitals and nursing note reviewed.  Constitutional:      Appearance: Normal appearance. She is well-developed and well-groomed.  HENT:     Head: Normocephalic and atraumatic.  Cardiovascular:     Rate and Rhythm: Normal rate and regular rhythm.     Heart sounds: Normal heart sounds. No murmur heard.   Pulmonary:     Effort: Pulmonary effort is normal.     Breath sounds: Normal breath sounds.  Skin:    General: Skin is warm and dry.  Neurological:     General: No focal deficit present.     Mental Status: She is alert and oriented  to person, place, and time. Mental status is at baseline.     Gait: Gait normal.  Psychiatric:        Attention and Perception: Attention and perception normal.        Mood and Affect: Mood and affect normal.        Speech: Speech normal.        Behavior: Behavior normal. Behavior is cooperative.        Thought Content: Thought content normal.        Cognition and Memory:  Cognition and memory normal.        Judgment: Judgment normal.     Assessment  Plan     Soreness breast - Plan: Ambulatory referral to General Surgery Dr. Ninfa Linden f/u MRI breast 11/2020 neg Due screening 02/2021 pt to [facility] schedule   Sore throat/cough - Plan: US Soft Tissue Head/Neck (NON-THYROID) r/u lymphadenopathy  Consider GI in the future and leb pulm to further w/u  Prn otc allergy med and NS for now  Acute conjunctivitis of both eyes, unspecified acute conjunctivitis type - Plan: erythromycin ophthalmic ointment otc allergy eye drops continue prn  Lymphadenopathy - Plan: US Soft Tissue Head/Neck (NON-THYROID)   HM Flu shotutdwill get [place]2021 Tdap, shingrix vaccine [facility] MMR moderna3/3 +covid 09/2020  mammo6/21/21negordered 02/2021 left breast painbreast mri 11/28/20 normal  Pap s/p hysterectomy for prolapse ovaries still intact pap 03/15/15 neg -of note menopause age 75 yo started cycle age 12 y.o   colonoscopy had 04/16/13 tubular adenoma x 1 referred [facility] today Dr. Tillie Rung and f/u other GI issues s/p gastric bypass 2014 see HPI -colonoscopy 05/20/18 negative polyps no bx f/u in 5 years  DEXA6/09/2018 due repeat bone density discuss with Dr. Matthew Saras ob/gyn [facility]  HCV neg 04/20/11   F/u with dermatology yearly Dr. Rolm Bookbinder [place] due h/o NMSC nose -dermatology 10/11 2019 negative tbse, seen [id number] in 1 year upcomingdue 02/2021  [facility] appt 06/17/2019 DEXA ordered last scan early osteoporosis  rec healthy diet and exercise  Provider: Dr. TOlivia MackieMcLean-Scocuzza-Internal Medicine

## 2021-01-26 NOTE — Patient Instructions (Addendum)
Nasal saline  Over the counter Allergy pill at night claritin (loratidine), allegra, xyal or zyrtec as needed   Consider lung specialist 03/2021 if cough persist [place] in [place] let me know   Call and schedule mammogram  If sore throat continues consider seeing GI or ENT -let me know   Ok to use allergy eye drops as needed   Allergic Conjunctivitis, Adult Allergic conjunctivitis is inflammation of the conjunctiva. The conjunctiva is the thin, clear membrane that covers the white part of the eye and the inner surface of the eyelid. In this condition:  The blood vessels in the conjunctiva become irritated and swell.  The eyes become red or pink and feel itchy. Allergic conjunctivitis cannot be spread from person to person. This condition can develop at any age and may be outgrown. What are the causes? This condition is caused by allergens. These are things that can cause an allergic reaction in some people but not in other people. Common allergens include:  Outdoor allergens, such as: ? Pollen, including pollen from grass and weeds. ? Mold spores. ? Car fumes.  Indoor allergens, such as: ? Dust. ? Smoke. ? Mold spores. ? Proteins in a pet's urine, saliva, or dander. What increases the risk? You may be more likely to develop this condition if you have a family history of these things:  Allergies.  Conditions caused by being exposed to allergens, such as: ? Allergic rhinitis. This is an allergic reaction that affects the nose. ? Bronchial asthma. This condition affects the large airways in the lungs and makes breathing difficult. ? Atopic dermatitis (eczema). This is inflammation of the skin that is long-term (chronic). What are the signs or symptoms? Symptoms of this condition include eyes that are:  Itchy.  Red.  Watery.  Puffy. Your eyes may also:  Sting or burn.  Have clear fluid draining from them.  Have thick mucus discharge and pain (vernal  conjunctivitis). How is this diagnosed? This condition may be diagnosed by:  Your medical history.  A physical exam.  Tests of the fluid draining from your eyes to rule out other causes.  Other tests to confirm the diagnosis, including: ? Testing for allergies. The skin may be pricked with a tiny needle. The pricked area is then exposed to small amounts of allergens. ? Testing for other eye conditions. Tests may include:  Blood tests.  Tissue scrapings from your eyelid. The tissue is then checked under a microscope. How is this treated? This condition may be treated with:  Cold, wet cloths (cold compresses) to soothe itching and swelling.  Washing the face to remove allergens.  Eye drops. These may be prescription or over-the-counter. You may need to try different types to see which one works best for you, such as: ? Eye drops that block the allergic reaction (antihistamine). ? Eye drops that reduce swelling and irritation (anti-inflammatory). ? Steroid eye drops, which may be given if other treatments have not worked (vernal conjunctivitis).  Oral antihistamine medicines. These are medicines taken by mouth to lessen your allergic reaction. You may need these if eye drops do not help or are difficult to use.   Follow these instructions at home: Eye care  Apply a clean, cold compress to your eyes for 10-20 minutes, 3-4 times a day.  Do not touch or rub your eyes.  Do not wear contact lenses until the inflammation is gone. Wear glasses instead.  Do not wear eye makeup until the inflammation is gone. General  instructions  Avoid known allergens whenever possible.  Take or apply over-the-counter and prescription medicines only as told by your health care provider. These include any eye drops.  Drink enough fluid to keep your urine pale yellow.  Keep all follow-up visits as told by your health care provider. This is important. Contact a health care provider if:  Your  symptoms get worse or do not get better with treatment.  You have mild eye pain.  You become sensitive to light.  You have spots or blisters on your eyes.  You have pus draining from your eyes.  You have a fever. Get help right away if:  You have redness, swelling, or other symptoms in only one eye.  Your vision is blurred or you have other vision changes.  You have severe eye pain. Summary  Allergic conjunctivitis is inflammation of the clear membrane that covers the white part of the eye and the inner surface of the eyelid.  Take or apply over-the-counter and prescription medicines only as told by your health care provider. These include eye drops.  Do not touch or rub your eyes.  Contact a health care provider if your symptoms get worse or do not get better with treatment. This information is not intended to replace advice given to you by your health care provider. Make sure you discuss any questions you have with your health care provider. Document Revised: 07/26/2019 Document Reviewed: 07/26/2019 Elsevier Patient Education  2021 [place].  Sore Throat A sore throat is pain, burning, irritation, or scratchiness in the throat. When you have a sore throat, you may feel pain or tenderness in your throat when you swallow or talk. Many things can cause a sore throat, including:  An infection.  Seasonal allergies.  Dryness in the air.  Irritants, such as smoke or pollution.  Radiation treatment to the area.  Gastroesophageal reflux disease (GERD).  A tumor. A sore throat is often the first sign of another sickness. It may happen with other symptoms, such as coughing, sneezing, fever, and swollen neck glands. Most sore throats go away without medical treatment. Follow these instructions at home:  Take over-the-counter medicines only as told by your health care provider. ? If your child has a sore throat, do not give your child aspirin because of the association  with Reye syndrome.  Drink enough fluids to keep your urine pale yellow.  Rest as needed.  To help with pain, try: ? Sipping warm liquids, such as broth, herbal tea, or warm water. ? Eating or drinking cold or frozen liquids, such as frozen ice pops. ? Gargling with a salt-water mixture 3-4 times a day or as needed. To make a salt-water mixture, completely dissolve -1 tsp (3-6 g) of salt in 1 cup (237 mL) of warm water. ? Sucking on hard candy or throat lozenges. ? Putting a cool-mist humidifier in your bedroom at night to moisten the air. ? Sitting in the bathroom with the door closed for 5-10 minutes while you run hot water in the shower.  Do not use any products that contain nicotine or tobacco, such as cigarettes, e-cigarettes, and chewing tobacco. If you need help quitting, ask your health care provider.  Wash your hands well and often with soap and water. If soap and water are not available, use hand sanitizer.      Contact a health care provider if:  You have a fever for more than 2-3 days.  You have symptoms that last (are persistent)  for more than 2-3 days.  Your throat does not get better within 7 days.  You have a fever and your symptoms suddenly get worse.  Your child who is 3 months to 32 years old has a temperature of 102.79F (39C) or higher. Get help right away if:  You have difficulty breathing.  You cannot swallow fluids, soft foods, or your saliva.  You have increased swelling in your throat or neck.  You have persistent nausea and vomiting. Summary  A sore throat is pain, burning, irritation, or scratchiness in the throat. Many things can cause a sore throat.  Take over-the-counter medicines only as told by your health care provider. Do not give your child aspirin.  Drink plenty of fluids, and rest as needed.  Contact a health care provider if your symptoms worsen or your sore throat does not get better within 7 days. This information is not  intended to replace advice given to you by your health care provider. Make sure you discuss any questions you have with your health care provider. Document Revised: 02/02/2018 Document Reviewed: 02/02/2018 Elsevier Patient Education  2021 [place].  Cough, Adult Coughing is a reflex that clears your throat and your airways (respiratory system). Coughing helps to heal and protect your lungs. It is normal to cough occasionally, but a cough that happens with other symptoms or lasts a long time may be a sign of a condition that needs treatment. An acute cough may only last 2-3 weeks, while a chronic cough may last 8 or more weeks. Coughing is commonly caused by:  Infection of the respiratory systemby viruses or bacteria.  Breathing in substances that irritate your lungs.  Allergies.  Asthma.  Mucus that runs down the back of your throat (postnasal drip).  Smoking.  Acid backing up from the stomach into the esophagus (gastroesophageal reflux).  Certain medicines.  Chronic lung problems.  Other medical conditions such as heart failure or a blood clot in the lung (pulmonary embolism). Follow these instructions at home: Medicines  Take over-the-counter and prescription medicines only as told by your health care provider.  Talk with your health care provider before you take a cough suppressant medicine. Lifestyle  Avoid cigarette smoke. Do not use any products that contain nicotine or tobacco, such as cigarettes, e-cigarettes, and chewing tobacco. If you need help quitting, ask your health care provider.  Drink enough fluid to keep your urine pale yellow.  Avoid caffeine.  Do not drink alcohol if your health care provider tells you not to drink.   General instructions  Pay close attention to changes in your cough. Tell your health care provider about them.  Always cover your mouth when you cough.  Avoid things that make you cough, such as perfume, candles, cleaning  products, or campfire or tobacco smoke.  If the air is dry, use a cool mist vaporizer or humidifier in your bedroom or your home to help loosen secretions.  If your cough is worse at night, try to sleep in a semi-upright position.  Rest as needed.  Keep all follow-up visits as told by your health care provider. This is important.   Contact a health care provider if you:  Have new symptoms.  Cough up pus.  Have a cough that does not get better after 2-3 weeks or gets worse.  Cannot control your cough with cough suppressant medicines and you are losing sleep.  Have pain that gets worse or pain that is not helped with medicine.  Have a fever.  Have unexplained weight loss.  Have night sweats. Get help right away if:  You cough up blood.  You have difficulty breathing.  Your heartbeat is very fast. These symptoms may represent a serious problem that is an emergency. Do not wait to see if the symptoms will go away. Get medical help right away. Call your local emergency services (911 in the [place] Do not drive yourself to the hospital. Summary  Coughing is a reflex that clears your throat and your airways. It is normal to cough occasionally, but a cough that happens with other symptoms or lasts a long time may be a sign of a condition that needs treatment.  Take over-the-counter and prescription medicines only as told by your health care provider.  Always cover your mouth when you cough.  Contact a health care provider if you have new symptoms or a cough that does not get better after 2-3 weeks or gets worse. This information is not intended to replace advice given to you by your health care provider. Make sure you discuss any questions you have with your health care provider. Document Revised: 09/21/2018 Document Reviewed: 09/21/2018 Elsevier Patient Education  [place].

## 2021-02-13 ENCOUNTER — Other Ambulatory Visit: Payer: Self-pay | Admitting: Surgery

## 2021-02-13 DIAGNOSIS — N644 Mastodynia: Secondary | ICD-10-CM | POA: Diagnosis not present

## 2021-03-06 ENCOUNTER — Ambulatory Visit
Admission: RE | Admit: 2021-03-06 | Discharge: 2021-03-06 | Disposition: A | Discharge status: Home / Self Care | Payer: Medicare PPO | Source: Ambulatory Visit | Attending: Internal Medicine | Admitting: Internal Medicine

## 2021-03-06 ENCOUNTER — Other Ambulatory Visit: Payer: Self-pay

## 2021-03-06 DIAGNOSIS — R591 Generalized enlarged lymph nodes: Secondary | ICD-10-CM | POA: Insufficient documentation

## 2021-03-06 DIAGNOSIS — M542 Cervicalgia: Secondary | ICD-10-CM | POA: Diagnosis not present

## 2021-03-06 DIAGNOSIS — J029 Acute pharyngitis, unspecified: Secondary | ICD-10-CM | POA: Diagnosis not present

## 2021-03-07 ENCOUNTER — Ambulatory Visit: Admission: RE | Admit: 2021-03-07 | Payer: Medicare PPO | Source: Ambulatory Visit

## 2021-03-12 ENCOUNTER — Other Ambulatory Visit: Payer: Self-pay | Admitting: Internal Medicine

## 2021-03-12 DIAGNOSIS — Z1231 Encounter for screening mammogram for malignant neoplasm of breast: Secondary | ICD-10-CM

## 2021-05-03 ENCOUNTER — Telehealth: Payer: Self-pay | Admitting: Internal Medicine

## 2021-05-03 NOTE — Telephone Encounter (Signed)
PT called to advise she would like to talk to [facility] and would like a callback. Did not advise of what it was in regards to.

## 2021-05-08 ENCOUNTER — Ambulatory Visit
Admission: RE | Admit: 2021-05-08 | Discharge: 2021-05-08 | Disposition: A | Discharge status: Home / Self Care | Payer: Medicare PPO | Source: Ambulatory Visit | Attending: Internal Medicine | Admitting: Internal Medicine

## 2021-05-08 ENCOUNTER — Other Ambulatory Visit: Payer: Self-pay

## 2021-05-08 ENCOUNTER — Ambulatory Visit: Payer: Medicare PPO

## 2021-05-08 DIAGNOSIS — Z1231 Encounter for screening mammogram for malignant neoplasm of breast: Secondary | ICD-10-CM

## 2021-05-08 NOTE — Telephone Encounter (Signed)
Called to speak with Katie [name]. Katie [name] stated that she had called previously to cancel her appointment with Katie [name]. She states that she did not reschedule and is unsure if she needs to.

## 2021-05-08 NOTE — Telephone Encounter (Signed)
Noted  

## 2021-06-14 DIAGNOSIS — M810 Age-related osteoporosis without current pathological fracture: Secondary | ICD-10-CM | POA: Diagnosis not present

## 2021-06-20 DIAGNOSIS — M810 Age-related osteoporosis without current pathological fracture: Secondary | ICD-10-CM | POA: Diagnosis not present

## 2021-07-16 DIAGNOSIS — M791 Myalgia, unspecified site: Secondary | ICD-10-CM | POA: Diagnosis not present

## 2021-07-16 DIAGNOSIS — R051 Acute cough: Secondary | ICD-10-CM | POA: Diagnosis not present

## 2021-07-16 DIAGNOSIS — J069 Acute upper respiratory infection, unspecified: Secondary | ICD-10-CM | POA: Diagnosis not present

## 2021-07-16 DIAGNOSIS — J029 Acute pharyngitis, unspecified: Secondary | ICD-10-CM | POA: Diagnosis not present

## 2021-07-16 DIAGNOSIS — Z20822 Contact with and (suspected) exposure to covid-19: Secondary | ICD-10-CM | POA: Diagnosis not present

## 2021-07-21 ENCOUNTER — Other Ambulatory Visit: Payer: Self-pay | Admitting: Internal Medicine

## 2021-07-21 DIAGNOSIS — E785 Hyperlipidemia, unspecified: Secondary | ICD-10-CM

## 2021-07-23 DIAGNOSIS — N958 Other specified menopausal and perimenopausal disorders: Secondary | ICD-10-CM | POA: Diagnosis not present

## 2021-07-23 DIAGNOSIS — Z01419 Encounter for gynecological examination (general) (routine) without abnormal findings: Secondary | ICD-10-CM | POA: Diagnosis not present

## 2021-07-23 DIAGNOSIS — R29890 Loss of height: Secondary | ICD-10-CM | POA: Diagnosis not present

## 2021-07-23 DIAGNOSIS — M816 Localized osteoporosis [Lequesne]: Secondary | ICD-10-CM | POA: Diagnosis not present

## 2021-07-23 DIAGNOSIS — Z6822 Body mass index (BMI) 22.0-22.9, adult: Secondary | ICD-10-CM | POA: Diagnosis not present

## 2021-07-27 ENCOUNTER — Encounter: Payer: Self-pay | Admitting: Internal Medicine

## 2021-07-27 ENCOUNTER — Other Ambulatory Visit: Payer: Self-pay

## 2021-07-27 ENCOUNTER — Ambulatory Visit: Payer: Medicare PPO | Admitting: Internal Medicine

## 2021-07-27 VITALS — BP 126/66 | HR 56 | Temp 97.0°F | Ht 64.0 in | Wt 121.2 lb

## 2021-07-27 DIAGNOSIS — Z23 Encounter for immunization: Secondary | ICD-10-CM | POA: Diagnosis not present

## 2021-07-27 DIAGNOSIS — E611 Iron deficiency: Secondary | ICD-10-CM

## 2021-07-27 DIAGNOSIS — K219 Gastro-esophageal reflux disease without esophagitis: Secondary | ICD-10-CM

## 2021-07-27 DIAGNOSIS — Z13220 Encounter for screening for lipoid disorders: Secondary | ICD-10-CM

## 2021-07-27 DIAGNOSIS — Z1329 Encounter for screening for other suspected endocrine disorder: Secondary | ICD-10-CM

## 2021-07-27 DIAGNOSIS — E559 Vitamin D deficiency, unspecified: Secondary | ICD-10-CM | POA: Diagnosis not present

## 2021-07-27 DIAGNOSIS — R030 Elevated blood-pressure reading, without diagnosis of hypertension: Secondary | ICD-10-CM | POA: Diagnosis not present

## 2021-07-27 DIAGNOSIS — E538 Deficiency of other specified B group vitamins: Secondary | ICD-10-CM

## 2021-07-27 DIAGNOSIS — Z0000 Encounter for general adult medical examination without abnormal findings: Secondary | ICD-10-CM | POA: Diagnosis not present

## 2021-07-27 DIAGNOSIS — F339 Major depressive disorder, recurrent, unspecified: Secondary | ICD-10-CM

## 2021-07-27 DIAGNOSIS — R110 Nausea: Secondary | ICD-10-CM

## 2021-07-27 DIAGNOSIS — R1011 Right upper quadrant pain: Secondary | ICD-10-CM

## 2021-07-27 DIAGNOSIS — Z1389 Encounter for screening for other disorder: Secondary | ICD-10-CM

## 2021-07-27 DIAGNOSIS — G250 Essential tremor: Secondary | ICD-10-CM | POA: Diagnosis not present

## 2021-07-27 DIAGNOSIS — K8050 Calculus of bile duct without cholangitis or cholecystitis without obstruction: Secondary | ICD-10-CM

## 2021-07-27 LAB — CBC WITH DIFFERENTIAL/PLATELET
Basophils Absolute: 0 10*3/uL (ref 0.0–0.1)
Basophils Relative: 0.4 % (ref 0.0–3.0)
Eosinophils Absolute: 0.2 10*3/uL (ref 0.0–0.7)
Eosinophils Relative: 4.6 % (ref 0.0–5.0)
HCT: 36.4 % (ref 36.0–46.0)
Hemoglobin: 12.1 g/dL (ref 12.0–15.0)
Lymphocytes Relative: 18.5 % (ref 12.0–46.0)
Lymphs Abs: 0.9 10*3/uL (ref 0.7–4.0)
MCHC: 33.3 g/dL (ref 30.0–36.0)
MCV: 94.7 fl (ref 78.0–100.0)
Monocytes Absolute: 0.3 10*3/uL (ref 0.1–1.0)
Monocytes Relative: 6.6 % (ref 3.0–12.0)
Neutro Abs: 3.4 10*3/uL (ref 1.4–7.7)
Neutrophils Relative %: 69.9 % (ref 43.0–77.0)
Platelets: 250 10*3/uL (ref 150.0–400.0)
RBC: 3.85 Mil/uL — ABNORMAL LOW (ref 3.87–5.11)
RDW: 12.4 % (ref 11.5–15.5)
WBC: 4.9 10*3/uL (ref 4.0–10.5)

## 2021-07-27 LAB — COMPREHENSIVE METABOLIC PANEL
ALT: 12 U/L (ref 0–35)
AST: 14 U/L (ref 0–37)
Albumin: 4.1 g/dL (ref 3.5–5.2)
Alkaline Phosphatase: 60 U/L (ref 39–117)
BUN: 14 mg/dL (ref 6–23)
CO2: 30 mEq/L (ref 19–32)
Calcium: 8.8 mg/dL (ref 8.4–10.5)
Chloride: 103 mEq/L (ref 96–112)
Creatinine, Ser: 0.65 mg/dL (ref 0.40–1.20)
GFR: 86.91 mL/min (ref >60.00)
Glucose, Bld: 98 mg/dL (ref 70–99)
Potassium: 4.8 mEq/L (ref 3.5–5.1)
Sodium: 139 mEq/L (ref 135–145)
Total Bilirubin: 0.6 mg/dL (ref 0.2–1.2)
Total Protein: 6.4 g/dL (ref 6.0–8.3)

## 2021-07-27 LAB — TSH: TSH: 2.64 u[IU]/mL (ref 0.35–5.50)

## 2021-07-27 LAB — LIPID PANEL
Cholesterol: 200 mg/dL (ref 0–200)
HDL: 59.3 mg/dL (ref >39.00)
LDL Cholesterol: 105 mg/dL — ABNORMAL HIGH (ref 0–99)
NonHDL: 140.56
Total CHOL/HDL Ratio: 3
Triglycerides: 179 mg/dL — ABNORMAL HIGH (ref 0.0–149.0)
VLDL: 35.8 mg/dL (ref 0.0–40.0)

## 2021-07-27 LAB — VITAMIN D 25 HYDROXY (VIT D DEFICIENCY, FRACTURES): VITD: 30.01 ng/mL (ref 30.00–100.00)

## 2021-07-27 LAB — VITAMIN B12: Vitamin B-12: 1236 pg/mL — ABNORMAL HIGH (ref 211–911)

## 2021-07-27 MED ORDER — ESOMEPRAZOLE MAGNESIUM 40 MG PO CPDR: 40.0000 mg | DELAYED_RELEASE_CAPSULE | Freq: Two times a day (BID) | ORAL | 3 refills | Status: DC

## 2021-07-27 MED ORDER — VENLAFAXINE HCL 37.5 MG PO TABS: ORAL_TABLET | ORAL | 3 refills | Status: DC

## 2021-07-27 MED ORDER — TETANUS-DIPHTH-ACELL PERTUSSIS 5-2.5-18.5 LF-MCG/0.5 IM SUSP: 0.5000 mL | Freq: Once | INTRAMUSCULAR | 0 refills | Status: AC

## 2021-07-27 MED ORDER — PROPRANOLOL HCL 40 MG PO TABS: 40.0000 mg | ORAL_TABLET | Freq: Two times a day (BID) | ORAL | 3 refills | Status: DC

## 2021-07-27 MED ORDER — ONDANSETRON HCL 4 MG PO TABS: 4.0000 mg | ORAL_TABLET | Freq: Three times a day (TID) | ORAL | 11 refills | Status: DC | PRN

## 2021-07-27 MED ORDER — SHINGRIX 50 MCG/0.5ML IM SUSR: 0.5000 mL | Freq: Once | INTRAMUSCULAR | 0 refills | Status: AC

## 2021-07-27 NOTE — Progress Notes (Signed)
Chief Complaint  Patient presents with   Follow-up   F/u  1gerd worse at night on nexium 40 mg qhs and reflu xwith bile watches diet  2. Husband having kidney out 07/30/21 [facility]  3. Sinus infection resolved after rocephin and zpack   Review of Systems  Constitutional:  Negative for weight loss.  HENT:  Negative for hearing loss.   Eyes:  Negative for blurred vision.  Respiratory:  Negative for shortness of breath.   Cardiovascular:  Negative for chest pain.  Gastrointestinal:  Positive for heartburn. Negative for abdominal pain and blood in stool.  Genitourinary:  Negative for dysuria.  Musculoskeletal:  Negative for falls and joint pain.  Skin:  Negative for rash.  Neurological:  Negative for headaches.  Psychiatric/Behavioral:  Negative for depression.   Past Medical History:  Diagnosis Date   Anxiety    Bile duct stricture    Breast mass, left    Choledocholithiasis    recurrent   COVID-19    09/30/20, 10/07/20    GERD (gastroesophageal reflux disease)    Hypertension    Migraine without aura, without mention of intractable migraine without mention of status migrainosus 01/03/2014   Rapid heart beat    Rotaviral gastroenteritis    12/2020   Past Surgical History:  Procedure Laterality Date   ABDOMINAL HYSTERECTOMY     BLADDER SURGERY     x 2   BREAST EXCISIONAL BIOPSY Right 2003   benign   BREAST LUMPECTOMY     right   BREAST LUMPECTOMY WITH RADIOACTIVE SEED LOCALIZATION Left 03/31/2017   Procedure: LEFT BREAST LUMPECTOMY WITH RADIOACTIVE SEED LOCALIZATION;  Surgeon: Coralie Keens, MD;  Location: [facility];  Service: General;  Laterality: Left;   CHOLECYSTECTOMY     COLONOSCOPY     [place], Hepatoduodenostomy  07/23/11   Dr Lazarus Gowda, [place]     both feet   GASTRIC BYPASS  05/04/2013   PARTIAL HYSTERECTOMY     TUBAL LIGATION     VEIN LIGATION AND STRIPPING     left leg   WRIST SURGERY     right   Family History  Problem Relation Age of Onset    Cancer Mother        lung, smoker   Alcohol abuse Father    GER disease Sister    Depression Sister    Anxiety disorder Sister    Hypertension Sister    GER disease Brother    Migraines Neg Hx    Breast cancer Neg Hx    Social History   Socioeconomic History   Marital status: Married    Spouse name: Not on file   Number of children: 2   Years of education: college   Highest education level: Not on file  Occupational History   Occupation: retired    Fish farm manager: RETIRED  Tobacco Use   Smoking status: Never   Smokeless tobacco: Never  Vaping Use   Vaping Use: Never used  Substance and Sexual Activity   Alcohol use: No   Drug use: No   Sexual activity: Yes  Other Topics Concern   Not on file  Social History Narrative   Does not have a living will or HPOA.   Desires CPR.   Does not want prolonged life support.   Social Determinants of Health   Financial Resource Strain: Not on file  Food Insecurity: Not on file  Transportation Needs: Not on file  Physical Activity: Not on file  Stress: Not on file  Social Connections: Not on file  Intimate Partner Violence: Not on file   Current Meds  Medication Sig   aspirin 81 MG tablet Take 81 mg by mouth daily.   Biotin 1 MG CAPS Take 1 mg by mouth daily.    CHOLECALCIFEROL PO Take by mouth.   ezetimibe (ZETIA) 10 MG tablet TAKE 1 TABLET(10 MG) BY MOUTH DAILY   Multiple Vitamin (MULTI-VITAMINS) TABS Take 1 tablet by mouth daily.    PROLIA 60 MG/ML SOSY injection Inject into the skin every 30 (thirty) days.   simvastatin (ZOCOR) 40 MG tablet TAKE 1 TABLET(40 MG) BY MOUTH DAILY AT 6 PM   Tdap (BOOSTRIX) 5-2.5-18.5 LF-MCG/0.5 injection Inject 0.5 mLs into the muscle once for 1 dose.   ursodiol (ACTIGALL) 250 MG tablet TAKE 1 TABLET(250 MG) BY MOUTH THREE TIMES DAILY   Zoster Vaccine Adjuvanted [facility]) injection Inject 0.5 mLs into the muscle once for 1 dose. X 2 doses   [DISCONTINUED] esomeprazole (NEXIUM) 40 MG capsule Take 1  capsule (40 mg total) by mouth daily. 30 minutes before food   [DISCONTINUED] meloxicam (MOBIC) 15 MG tablet TAKE 1 TABLET(15 MG) BY MOUTH DAILY   [DISCONTINUED] ondansetron (ZOFRAN) 4 MG tablet Take 1 tablet (4 mg total) by mouth every 8 (eight) hours as needed for nausea or vomiting.   [DISCONTINUED] propranolol (INDERAL) 40 MG tablet Take 1 tablet (40 mg total) by mouth 2 (two) times daily.   [DISCONTINUED] venlafaxine (EFFEXOR) 37.5 MG tablet TAKE 2 TABLETS BY MOUTH EVERY MORNING AND 1 TABLET EVERY EVENING   Allergies  Allergen Reactions   Lipitor [Atorvastatin Calcium]     Joint pain   No results found for this or any previous visit (from the past 2160 hour(s)). Objective  Body mass index is 20.8 kg/m. Wt Readings from Last 3 Encounters:  07/27/21 121 lb 3.2 oz (55 kg)  01/26/21 118 lb 12.8 oz (53.9 kg)  01/02/21 112 lb (50.8 kg)   Temp Readings from Last 3 Encounters:  07/27/21 (!) 97 F (36.1 C) (Temporal)  01/26/21 98.1 F (36.7 C) (Oral)  12/22/20 98.1 F (36.7 C) (Oral)   BP Readings from Last 3 Encounters:  07/27/21 126/66  01/26/21 122/60  12/22/20 120/74   Pulse Readings from Last 3 Encounters:  07/27/21 (!) 56  01/26/21 (!) 57  12/22/20 (!) 53    Physical Exam Vitals and nursing note reviewed.  Constitutional:      Appearance: Normal appearance. She is well-developed and well-groomed.  HENT:     Head: Normocephalic and atraumatic.  Eyes:     Conjunctiva/sclera: Conjunctivae normal.     Pupils: Pupils are equal, round, and reactive to light.  Cardiovascular:     Rate and Rhythm: Normal rate and regular rhythm.     Heart sounds: Normal heart sounds. No murmur heard. Pulmonary:     Effort: Pulmonary effort is normal.     Breath sounds: Normal breath sounds.  Abdominal:     General: Abdomen is flat. Bowel sounds are normal.     Tenderness: There is no abdominal tenderness.  Musculoskeletal:        General: No tenderness.  Skin:    General: Skin is  warm and dry.  Neurological:     General: No focal deficit present.     Mental Status: She is alert and oriented to person, place, and time. Mental status is at baseline.     Cranial Nerves: Cranial nerves 2-12 are intact.  Gait: Gait is intact.  Psychiatric:        Attention and Perception: Attention and perception normal.        Mood and Affect: Mood and affect normal.        Speech: Speech normal.        Behavior: Behavior normal. Behavior is cooperative.        Thought Content: Thought content normal.        Cognition and Memory: Cognition and memory normal.        Judgment: Judgment normal.    Assessment  Plan  Gastroesophageal reflux disease without esophagitis - Plan: esomeprazole (NEXIUM) 40 MG capsule bid can add pepcid 20 mg at night if needed  Episode of recurrent major depressive disorder, unspecified depression episode severity ([place]) - Plan: venlafaxine (EFFEXOR) 37.5 MG tablet  Benign head tremor - Plan: propranolol (INDERAL) 40 MG tablet  Right upper quadrant abdominal pain - Plan: ondansetron (ZOFRAN) 4 MG tablet  Choledocholithiasis - Plan: ondansetron (ZOFRAN) 4 MG tablet  Nausea - Plan: ondansetron (ZOFRAN) 4 MG tablet   HM Flu shot today Tdap, shingrix vaccine given today  Immune MMR moderna 3/3 +covid 09/2020    mammo 03/06/20 neg ordered 02/2021  left breast pain breast mri 11/28/20 normal Mammogram 05/08/21 negative   Pap s/p hysterectomy for prolapse ovaries still intact pap 03/15/15 neg -of note menopause age 33 yo started cycle age 13 y.o    colonoscopy had 04/16/13 tubular adenoma x 1 referred [facility] today Dr. Tillie Rung and f/u other GI issues s/p gastric bypass 2014 see HPI  -colonoscopy 05/20/18 negative polyps no bx f/u in 5 years    DEXA 02/15/2019 due repeat bone density discuss with Dr. Matthew Saras ob/gyn [facility] Dexa 07/23/21 week improved    HCV neg 04/20/11    F/u with dermatology yearly Dr. Rolm Bookbinder [place] due h/o NMSC nose  -dermatology  10/11 2019 negative tbse, seen 2020 f/u in 1 year upcoming due 02/2021      [facility] appt 06/17/2019 DEXA ordered last scan early osteoporosis     rec healthy diet and exercise  Provider: Dr. Olivia Mackie McLean-Scocuzza-Internal Medicine

## 2021-07-27 NOTE — Patient Instructions (Addendum)
Volatren gel as needed  Tylenol  Lidocaine pain patch   Consider over the counter pepcid at night if symptoms consider in 2 weeks   Zoster Vaccine, Recombinant injection What is this medication? ZOSTER VACCINE (ZOS ter vak SEEN) is a vaccine used to reduce the risk of getting shingles. This vaccine is not used to treat shingles or nerve pain from shingles. This medicine may be used for other purposes; ask your health care provider or pharmacist if you have questions. COMMON BRAND NAME(S): [facility] What should I tell my care team before I take this medication? They need to know if you have any of these conditions: cancer immune system problems an unusual or allergic reaction to Zoster vaccine, other medications, foods, dyes, or preservatives pregnant or trying to get pregnant breast-feeding How should I use this medication? This vaccine is injected into a muscle. It is given by a health care provider. A copy of Vaccine Information Statements will be given before each vaccination. Be sure to read this information carefully each time. This sheet may change often. Talk to your health care provider about the use of this vaccine in children. This vaccine is not approved for use in children. Overdosage: If you think you have taken too much of this medicine contact a poison control center or emergency room at once. NOTE: This medicine is only for you. Do not share this medicine with others. What if I miss a dose? Keep appointments for follow-up (booster) doses. It is important not to miss your dose. Call your health care provider if you are unable to keep an appointment. What may interact with this medication? medicines that suppress your immune system medicines to treat cancer steroid medicines like prednisone or cortisone This list may not describe all possible interactions. Give your health care provider a list of all the medicines, herbs, non-prescription drugs, or dietary supplements you  use. Also tell them if you smoke, drink alcohol, or use illegal drugs. Some items may interact with your medicine. What should I watch for while using this medication? Visit your health care provider regularly. This vaccine, like all vaccines, may not fully protect everyone. What side effects may I notice from receiving this medication? Side effects that you should report to your doctor or health care professional as soon as possible: allergic reactions (skin rash, itching or hives; swelling of the face, lips, or tongue) trouble breathing Side effects that usually do not require medical attention (report these to your doctor or health care professional if they continue or are bothersome): chills headache fever nausea pain, redness, or irritation at site where injected tiredness vomiting This list may not describe all possible side effects. Call your doctor for medical advice about side effects. You may report side effects to FDA at 1-800-FDA-1088. Where should I keep my medication? This vaccine is only given by a health care provider. It will not be stored at home. NOTE: This sheet is a summary. It may not cover all possible information. If you have questions about this medicine, talk to your doctor, pharmacist, or health care provider.  2022 Elsevier/Gold Standard (2021-05-22 00:00:00)   Gastroesophageal Reflux Disease, Adult Gastroesophageal reflux (GER) happens when acid from the stomach flows up into the tube that connects the mouth and the stomach (esophagus). Normally, food travels down the esophagus and stays in the stomach to be digested. However, when a person has GER, food and stomach acid sometimes move back up into the esophagus. If this becomes a more serious  problem, the person may be diagnosed with a disease called gastroesophageal reflux disease (GERD). GERD occurs when the reflux: Happens often. Causes frequent or severe symptoms. Causes problems such as damage to the  esophagus. When stomach acid comes in contact with the esophagus, the acid may cause inflammation in the esophagus. Over time, GERD may create small holes (ulcers) in the lining of the esophagus. What are the causes? This condition is caused by a problem with the muscle between the esophagus and the stomach (lower esophageal sphincter, or LES). Normally, the LES muscle closes after food passes through the esophagus to the stomach. When the LES is weakened or abnormal, it does not close properly, and that allows food and stomach acid to go back up into the esophagus. The LES can be weakened by certain dietary substances, medicines, and medical conditions, including: Tobacco use. Pregnancy. Having a hiatal hernia. Alcohol use. Certain foods and beverages, such as coffee, chocolate, onions, and peppermint. What increases the risk? You are more likely to develop this condition if you: Have an increased body weight. Have a connective tissue disorder. Take NSAIDs, such as ibuprofen. What are the signs or symptoms? Symptoms of this condition include: Heartburn. Difficult or painful swallowing and the feeling of having a lump in the throat. A bitter taste in the mouth. Bad breath and having a large amount of saliva. Having an upset or bloated stomach and belching. Chest pain. Different conditions can cause chest pain. Make sure you see your health care provider if you experience chest pain. Shortness of breath or wheezing. Ongoing (chronic) cough or a nighttime cough. Wearing away of tooth enamel. Weight loss. How is this diagnosed? This condition may be diagnosed based on a medical history and a physical exam. To determine if you have mild or severe GERD, your health care provider may also monitor how you respond to treatment. You may also have tests, including: A test to examine your stomach and esophagus with a small camera (endoscopy). A test that measures the acidity level in your  esophagus. A test that measures how much pressure is on your esophagus. A barium swallow or modified barium swallow test to show the shape, size, and functioning of your esophagus. How is this treated? Treatment for this condition may vary depending on how severe your symptoms are. Your health care provider may recommend: Changes to your diet. Medicine. Surgery. The goal of treatment is to help relieve your symptoms and to prevent complications. Follow these instructions at home: Eating and drinking  Follow a diet as recommended by your health care provider. This may involve avoiding foods and drinks such as: Coffee and tea, with or without caffeine. Drinks that contain alcohol. Energy drinks and sports drinks. Carbonated drinks or sodas. Chocolate and cocoa. Peppermint and mint flavorings. Garlic and onions. Horseradish. Spicy and acidic foods, including peppers, chili powder, curry powder, vinegar, hot sauces, and barbecue sauce. Citrus fruit juices and citrus fruits, such as oranges, lemons, and limes. Tomato-based foods, such as red sauce, chili, salsa, and pizza with red sauce. Fried and fatty foods, such as donuts, french fries, potato chips, and high-fat dressings. High-fat meats, such as hot dogs and fatty cuts of red and white meats, such as rib eye steak, sausage, ham, and bacon. High-fat dairy items, such as whole milk, butter, and cream cheese. Eat small, frequent meals instead of large meals. Avoid drinking large amounts of liquid with your meals. Avoid eating meals during the 2-3 hours before bedtime. Avoid lying  down right after you eat. Do not exercise right after you eat. Lifestyle  Do not use any products that contain nicotine or tobacco. These products include cigarettes, chewing tobacco, and vaping devices, such as e-cigarettes. If you need help quitting, ask your health care provider. Try to reduce your stress by using methods such as yoga or meditation. If you  need help reducing stress, ask your health care provider. If you are overweight, reduce your weight to an amount that is healthy for you. Ask your health care provider for guidance about a safe weight loss goal. General instructions Pay attention to any changes in your symptoms. Take over-the-counter and prescription medicines only as told by your health care provider. Do not take aspirin, ibuprofen, or other NSAIDs unless your health care provider told you to take these medicines. Wear loose-fitting clothing. Do not wear anything tight around your waist that causes pressure on your abdomen. Raise (elevate) the head of your bed about 6 inches (15 cm). You can use a wedge to do this. Avoid bending over if this makes your symptoms worse. Keep all follow-up visits. This is important. Contact a health care provider if: You have: New symptoms. Unexplained weight loss. Difficulty swallowing or it hurts to swallow. Wheezing or a persistent cough. A hoarse voice. Your symptoms do not improve with treatment. Get help right away if: You have sudden pain in your arms, neck, jaw, teeth, or back. You suddenly feel sweaty, dizzy, or light-headed. You have chest pain or shortness of breath. You vomit and the vomit is green, yellow, or black, or it looks like blood or coffee grounds. You faint. You have stool that is red, bloody, or black. You cannot swallow, drink, or eat. These symptoms may represent a serious problem that is an emergency. Do not wait to see if the symptoms will go away. Get medical help right away. Call your local emergency services (911 in the [place] Do not drive yourself to the hospital. Summary Gastroesophageal reflux happens when acid from the stomach flows up into the esophagus. GERD is a disease in which the reflux happens often, causes frequent or severe symptoms, or causes problems such as damage to the esophagus. Treatment for this condition may vary depending on how severe  your symptoms are. Your health care provider may recommend diet and lifestyle changes, medicine, or surgery. Contact a health care provider if you have new or worsening symptoms. Take over-the-counter and prescription medicines only as told by your health care provider. Do not take aspirin, ibuprofen, or other NSAIDs unless your health care provider told you to do so. Keep all follow-up visits as told by your health care provider. This is important. This information is not intended to replace advice given to you by your health care provider. Make sure you discuss any questions you have with your health care provider. Document Revised: 03/13/2020 Document Reviewed: 03/13/2020 Elsevier Patient Education  2022 [place] for Gastroesophageal Reflux Disease, Adult When you have gastroesophageal reflux disease (GERD), the foods you eat and your eating habits are very important. Choosing the right foods can help ease the discomfort of GERD. Consider working with a dietitian to help you make healthy food choices. What are tips for following this plan? Reading food labels Look for foods that are low in saturated fat. Foods that have less than 5% of daily value (DV) of fat and 0 g of trans fats may help with your symptoms. Cooking Cook foods using methods other  than frying. This may include baking, steaming, grilling, or broiling. These are all methods that do not need a lot of fat for cooking. To add flavor, try to use herbs that are low in spice and acidity. Meal planning  Choose healthy foods that are low in fat, such as fruits, vegetables, whole grains, low-fat dairy products, lean meats, fish, and poultry. Eat frequent, small meals instead of three large meals each day. Eat your meals slowly, in a relaxed setting. Avoid bending over or lying down until 2-3 hours after eating. Limit high-fat foods such as fatty meats or fried foods. Limit your intake of fatty foods, such as oils,  butter, and shortening. Avoid the following as told by your health care provider: Foods that cause symptoms. These may be different for different people. Keep a food diary to keep track of foods that cause symptoms. Alcohol. Drinking large amounts of liquid with meals. Eating meals during the 2-3 hours before bed. Lifestyle Maintain a healthy weight. Ask your health care provider what weight is healthy for you. If you need to lose weight, work with your health care provider to do so safely. Exercise for at least 30 minutes on 5 or more days each week, or as told by your health care provider. Avoid wearing clothes that fit tightly around your waist and chest. Do not use any products that contain nicotine or tobacco. These products include cigarettes, chewing tobacco, and vaping devices, such as e-cigarettes. If you need help quitting, ask your health care provider. Sleep with the head of your bed raised. Use a wedge under the mattress or blocks under the bed frame to raise the head of the bed. Chew sugar-free gum after mealtimes. What foods should I eat? Eat a healthy, well-balanced diet of fruits, vegetables, whole grains, low-fat dairy products, lean meats, fish, and poultry. Each person is different. Foods that may trigger symptoms in one person may not trigger any symptoms in another person. Work with your health care provider to identify foods that are safe for you. The items listed above may not be a complete list of recommended foods and beverages. Contact a dietitian for more information. What foods should I avoid? Limiting some of these foods may help manage the symptoms of GERD. Everyone is different. Consult a dietitian or your health care provider to help you identify the exact foods to avoid, if any. Fruits Any fruits prepared with added fat. Any fruits that cause symptoms. For some people this may include citrus fruits, such as oranges, grapefruit, pineapple, and  lemons. Vegetables Deep-fried vegetables. [place] fries. Any vegetables prepared with added fat. Any vegetables that cause symptoms. For some people, this may include tomatoes and tomato products, chili peppers, onions and garlic, and horseradish. Grains Pastries or quick breads with added fat. Meats and other proteins High-fat meats, such as fatty beef or pork, hot dogs, ribs, ham, sausage, salami, and bacon. Fried meat or protein, including fried fish and fried chicken. Nuts and nut butters, in large amounts. Dairy Whole milk and chocolate milk. Sour cream. Cream. Ice cream. Cream cheese. Milkshakes. Fats and oils Butter. Margarine. Shortening. Ghee. Beverages Coffee and tea, with or without caffeine. Carbonated beverages. Sodas. Energy drinks. Fruit juice made with acidic fruits, such as orange or grapefruit. Tomato juice. Alcoholic drinks. Sweets and desserts Chocolate and cocoa. Donuts. Seasonings and condiments Pepper. Peppermint and spearmint. Added salt. Any condiments, herbs, or seasonings that cause symptoms. For some people, this may include curry, hot sauce, or vinegar-based  salad dressings. The items listed above may not be a complete list of foods and beverages to avoid. Contact a dietitian for more information. Questions to ask your health care provider Diet and lifestyle changes are usually the first steps that are taken to manage symptoms of GERD. If diet and lifestyle changes do not improve your symptoms, talk with your health care provider about taking medicines. Where to find more information International Foundation for Gastrointestinal Disorders: aboutgerd.org Summary When you have gastroesophageal reflux disease (GERD), food and lifestyle choices may be very helpful in easing the discomfort of GERD. Eat frequent, small meals instead of three large meals each day. Eat your meals slowly, in a relaxed setting. Avoid bending over or lying down until 2-3 hours after  eating. Limit high-fat foods such as fatty meats or fried foods. This information is not intended to replace advice given to you by your health care provider. Make sure you discuss any questions you have with your health care provider. Document Revised: 03/13/2020 Document Reviewed: 03/13/2020 Elsevier Patient Education  2022 [place].  Breast Tenderness Breast tenderness is a common problem for women of all ages, but may also occur in men. Breast tenderness may range from mild discomfort to severe pain. In women, the pain usually comes and goes with the menstrual cycle, but it can also be constant. Breast tenderness has many possible causes, including hormon e changes, infections, and taking certain medicines. You may have tests, such as a mammogram or an ultrasound, to check for any unusual findings. Having breast tenderness usually does not mean that you have breast cancer. Follow these instructions at home: Managing pain and discomfort  If directed, put ice to the painful area. To do this: Put ice in a plastic bag. Place a towel between your skin and the bag. Leave the ice on for 20 minutes, 2-3 times a day. Wear a supportive bra, especially during exercise. You may also want to wear a supportive bra while sleeping if your breasts are very tender. Medicines Take over-the-counter and prescription medicines only as told by your health care provider. If the cause of your pain is infection, you may be prescribed an antibiotic medicine. If you were prescribed an antibiotic, take it as told by your health care provider. Do not stop taking the antibiotic even if you start to feel better. Eating and drinking Your health care provider may recommend that you lessen the amount of fat in your diet. You can do this by: Limiting fried foods. Cooking foods using methods such as baking, boiling, grilling, and broiling. Decrease the amount of caffeine in your diet. Instead, drink more water and  choose caffeine-free drinks. General instructions  Keep a log of the days and times when your breasts are most tender. Ask your health care provider how to do breast exams at home. This will help you notice if you have an unusual growth or lump. Keep all follow-up visits as told by your health care provider. This is important. Contact a health care provider if: Any part of your breast is hard, red, and hot to the touch. This may be a sign of infection. You are a woman and: Not breastfeeding and you have fluid, especially blood or pus, coming out of your nipples. Have a new or painful lump in your breast that remains after your menstrual period ends. You have a fever. Your pain does not improve or it gets worse. Your pain is interfering with your daily activities. Summary Breast tenderness  may range from mild discomfort to severe pain. Breast tenderness has many possible causes, including hormone changes, infections, and taking certain medicines. It can be treated with ice, wearing a supportive bra, and medicines. Make changes to your diet if told to by your health care provider. This information is not intended to replace advice given to you by your health care provider. Make sure you discuss any questions you have with your health care provider. Document Revised: 01/25/2019 Document Reviewed: 01/25/2019 Elsevier Patient Education  [place].

## 2021-07-28 LAB — URINALYSIS, ROUTINE W REFLEX MICROSCOPIC
Bilirubin Urine: NEGATIVE
Glucose, UA: NEGATIVE
Hgb urine dipstick: NEGATIVE
Ketones, ur: NEGATIVE
Leukocytes,Ua: NEGATIVE
Nitrite: NEGATIVE
Protein, ur: NEGATIVE
Specific Gravity, Urine: 1.016 (ref 1.001–1.035)
pH: 7.5 (ref 5.0–8.0)

## 2021-07-28 LAB — IRON,TIBC AND FERRITIN PANEL
%SAT: 31 % (calc) (ref 16–45)
Ferritin: 59 ng/mL (ref 16–288)
Iron: 100 ug/dL (ref 45–160)
TIBC: 322 mcg/dL (calc) (ref 250–450)

## 2021-07-30 NOTE — Progress Notes (Signed)
Urine normal  Iron normal  Liver kidneys normal  Cholesterol worse triglycerides  -rec take fish oil 2000 mg 2x per day with food nature made brand  Ldl slightly elevated  Rec healthy diet and exercise  Vit d normal Thyroid normal B12 elevated no harm in this  Blood cts normal

## 2021-08-18 ENCOUNTER — Other Ambulatory Visit: Payer: Self-pay | Admitting: Internal Medicine

## 2021-08-18 DIAGNOSIS — K8020 Calculus of gallbladder without cholecystitis without obstruction: Secondary | ICD-10-CM

## 2021-08-27 ENCOUNTER — Telehealth: Payer: Self-pay | Admitting: Internal Medicine

## 2021-08-27 NOTE — Telephone Encounter (Signed)
Faxed signed ROI to [facility] requesting patient's last bone density results. Received confirmation of fax going through.   ROI sent to scan.

## 2021-08-30 ENCOUNTER — Ambulatory Visit: Payer: Medicare PPO | Admitting: Internal Medicine

## 2021-09-04 ENCOUNTER — Ambulatory Visit: Payer: Medicare PPO | Admitting: Internal Medicine

## 2021-10-22 NOTE — Progress Notes (Signed)
Cardiology Office Note:    Date:  10/23/2021   ID:  VALETA PAZ, DOB 13-Apr-1946, MRN [id number]  PCP:  McLean-Scocuzza, Nino Glow, MD   [place] Providers Cardiologist:  Kathlyn Sacramento, MD Click to update primary MD,subspecialty MD or APP then REFRESH:1}    Referring MD: McLean-Scocuzza, Olivia Mackie *   Chief Complaint  Patient presents with   office visit-chest pain    Patient states she had an episode of sharp chest pain on 10/04/2021 while driving at which time she "broke out in a sweat so bad that water dripped down from her face". She states she has only has some "twinges" in her chest since then.    History of Present Illness:    JESLYN AMSLER is a 76 y.o. female with a hx of HTN, GERD, and palpitations. On propranolol for years for rapid heart beat and tremor.   She was referred to our practice for evaluation of pain in the left breast area and seen by Dr. Fletcher Anon on 09/06/2020.  She reported a history of palpitations controlled with propanolol and previous bilateral breast surgery with soreness in the left breast over the last few months.  Pain was occurring at rest but not related to physical activity. 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  Area was tender to touch . Recent mammogram was unremarkable.  EKG was normal, cardiac etiology of chest pain felt to be unlikely, and she was advised to follow-up as needed.  Today, she is here for evaluation of chest pain. States on 10/04/21 she had an episode of severe sharp chest pain that occurred in the center of her chest while driving. Accompanied by breaking out into a profuse sweat.  She had eaten that day, maybe not enough, but had episodes of low blood sugar in the past that did not feel like this. Prior to this event, she had 2 or 3 occasions of fleeting chest pain that resolved rather quickly. She has had no further similar episodes of chest pain since 1/19.  Describes an ache in her left breast that she has had for  many years. Has had normal imaging with no explanation other than possibly MSK pain. 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 Had calcification removed from left nipple several years ago. She denies pain at present. She denies shortness of breath, lower extremity edema, fatigue, palpitations, melena, hematuria, hemoptysis, diaphoresis, weakness, presyncope, syncope, orthopnea, and PND. Walks on a treadmill at 2.5 mph 3 times per week without symptoms. Feels that her balance is not as steady as it used to be but denies near falls or pre-syncope. Legs feel very tired when going up stairs. Takes aspirin for prevention.    Past Medical History:  Diagnosis Date   Anxiety    Bile duct stricture    Breast mass, left    Choledocholithiasis    recurrent   COVID-19    09/30/20, 10/07/20    GERD (gastroesophageal reflux disease)    Hypertension    Migraine without aura, without mention of intractable migraine without mention of status migrainosus 01/03/2014   Rapid heart beat    Rotaviral gastroenteritis    12/2020    Past Surgical History:  Procedure Laterality Date   ABDOMINAL HYSTERECTOMY     BLADDER SURGERY     x 2   BREAST EXCISIONAL BIOPSY Right 2003   benign   BREAST LUMPECTOMY  right   BREAST LUMPECTOMY WITH RADIOACTIVE SEED LOCALIZATION Left 03/31/2017   Procedure: LEFT BREAST LUMPECTOMY WITH RADIOACTIVE SEED LOCALIZATION;  Surgeon: Coralie Keens, MD;  Location: [facility];  Service: General;  Laterality: Left;   CHOLECYSTECTOMY     COLONOSCOPY     Explor Lap, Hepatoduodenostomy  07/23/11   Dr Lazarus Gowda, [place]     both feet   GASTRIC BYPASS  05/04/2013   PARTIAL HYSTERECTOMY     TUBAL LIGATION     VEIN LIGATION AND STRIPPING     left leg   WRIST SURGERY     right    Current Medications: Current Meds  Medication Sig   aspirin 81 MG tablet Take 81 mg by mouth daily.   Biotin 1 MG CAPS Take 1 mg by mouth daily.     CHOLECALCIFEROL PO Take by mouth daily.   CYANOCOBALAMIN IJ Inject as directed every 30 (thirty) days.   denosumab (PROLIA) 60 MG/ML SOSY injection Inject 60 mg into the skin every 6 (six) months.   esomeprazole (NEXIUM) 40 MG capsule Take 1 capsule (40 mg total) by mouth 2 (two) times daily before a meal. 30 minutes before food   ezetimibe (ZETIA) 10 MG tablet TAKE 1 TABLET(10 MG) BY MOUTH DAILY   Ferrous Sulfate (IRON PO) Take 1 tablet by mouth every other day. OTC   metoprolol tartrate (LOPRESSOR) 25 MG tablet Take 1 tablet (25 mg total) by mouth once for 1 dose. Take 2 hours prior to test IF HEART RATE IS GREATER THAN 50   Multiple Vitamin (MULTI-VITAMINS) TABS Take 1 tablet by mouth daily.    Multiple Vitamins-Minerals (OCUVITE PRESERVISION PO) Take by mouth 2 (two) times daily.   ondansetron (ZOFRAN) 4 MG tablet Take 1 tablet (4 mg total) by mouth every 8 (eight) hours as needed for nausea or vomiting.   propranolol (INDERAL) 40 MG tablet Take 1 tablet (40 mg total) by mouth 2 (two) times daily.   simvastatin (ZOCOR) 40 MG tablet TAKE 1 TABLET(40 MG) BY MOUTH DAILY AT 6 PM   ursodiol (ACTIGALL) 250 MG tablet TAKE 1 TABLET(250 MG) BY MOUTH THREE TIMES DAILY   venlafaxine (EFFEXOR) 37.5 MG tablet TAKE 2 TABLETS BY MOUTH EVERY MORNING AND 1 TABLET EVERY EVENING     Allergies:   Lipitor [atorvastatin calcium]   Social History   Socioeconomic History   Marital status: Married    Spouse name: Not on file   Number of children: 2   Years of education: college   Highest education level: Not on file  Occupational History   Occupation: retired    Fish farm manager: RETIRED  Tobacco Use   Smoking status: Never   Smokeless tobacco: Never  Vaping Use   Vaping Use: Never used  Substance and Sexual Activity   Alcohol use: No   Drug use: No   Sexual activity: Yes  Other Topics Concern   Not on file  Social History Narrative   Does not have a living will or HPOA.   Desires CPR.   Does not  want prolonged life support.   Social Determinants of Health   Financial Resource Strain: Not on file  Food Insecurity: Not on file  Transportation Needs: Not on file  Physical Activity: Not on file  Stress: Not on file  Social Connections: Not on file     Family History: The patient's family history includes Alcohol abuse in her father; Anxiety disorder in her sister; Cancer in her mother;  Depression in her sister; GER disease in her brother and sister; Heart attack in her sister; Hypertension in her sister. There is no history of Migraines or Breast cancer.  ROS:   Please see the history of present illness. All other systems reviewed and are negative.  Labs/Other Studies Reviewed:    The following studies were reviewed today:    Recent Labs: 07/27/2021: ALT 12; BUN 14; Creatinine, Ser 0.65; Hemoglobin 12.1; Platelets 250.0; Potassium 4.8; Sodium 139; TSH 2.64  Recent Lipid Panel    Component Value Date/Time   CHOL 200 07/27/2021 1013   TRIG 179.0 (H) 07/27/2021 1013   HDL 59.30 07/27/2021 1013   CHOLHDL 3 07/27/2021 1013   VLDL 35.8 07/27/2021 1013   LDLCALC 105 (H) 07/27/2021 1013   LDLCALC 111 (H) 06/23/2020 1523   LDLDIRECT 144.7 08/26/2014 1046     Risk Assessment/Calculations:       Physical Exam:    VS:  BP 140/70 (BP Location: Left Arm, Patient Position: Sitting, Cuff Size: Normal)    Pulse (!) 54    Ht 5\' 7"  (1.702 m)    Wt 121 lb (54.9 kg)    SpO2 99%    BMI 18.95 kg/m     Wt Readings from Last 3 Encounters:  10/23/21 121 lb (54.9 kg)  07/27/21 121 lb 3.2 oz (55 kg)  01/26/21 118 lb 12.8 oz (53.9 kg)     GEN:  Well nourished, well developed in no acute distress HEENT: Normal NECK: No JVD; No carotid bruits CARDIAC: RRR, no murmurs, rubs, gallops RESPIRATORY:  Clear to auscultation without rales, wheezing or rhonchi  ABDOMEN: Soft, non-tender, non-distended MUSCULOSKELETAL:  No edema; No deformity. 2+ pedal pulses, equal bilaterally SKIN: Warm  and dry NEUROLOGIC:  Alert and oriented x 3 PSYCHIATRIC:  Normal affect   EKG:  EKG is ordered today.  The ekg ordered today demonstrates sinus bradycardia at 54 bpm, no ST/T wave abnormality  Diagnoses:    1. Chest pain of uncertain etiology   2. Precordial pain   3. Hyperlipidemia, unspecified hyperlipidemia type   4. Palpitations    Assessment and Plan:     Chest pain: One episode of chest pain concerning for angina. No prior ischemia evaluation. Will get coronary CT define coronary anatomy. Could consider placement of cardiac monitor if coronary CT is unrevealing. Continue propanolol.   Hyperlipidemia: LDL 105 07/27/21. On simvastatin, previously intolerant of atorvastatin. Will await calcium score and if elevated, would favor trial of rosuvastatin.   Palpitations: She denies palpitations, fast heart rate.  Has been on propranolol for years for "fast heart rate" and tremor. Consideration given to reducing dose in the setting of bradycardia, however she is scheduled for coronary CT and slow HR is preferred for better test results. No dizziness, lightheadedness, fatigue, presyncope, or syncope to  suggest symptomatic bradycardia. Could consider reducing propranolol dose if HR remains < 60.   Disposition: 2 months with Dr. Fletcher Anon or APP  Medication Adjustments/Labs and Tests Ordered: Current medicines are reviewed at length with the patient today.  Concerns regarding medicines are outlined above.  Orders Placed This Encounter  Procedures   CT CORONARY MORPH W/CTA COR W/SCORE W/CA W/CM &/OR WO/CM   Basic metabolic panel   EKG 60-AVWU   Meds ordered this encounter  Medications   metoprolol tartrate (LOPRESSOR) 25 MG tablet    Sig: Take 1 tablet (25 mg total) by mouth once for 1 dose. Take 2 hours prior to test IF HEART RATE  IS GREATER THAN 50    Dispense:  1 tablet    Refill:  0    Patient Instructions   Medication Instructions:  No changes at this time.  *If you need a  refill on your cardiac medications before your next appointment, please call your pharmacy*   Lab Work: BMET today  If you have labs (blood work) drawn today and your tests are completely normal, you will receive your results only by: [place] (if you have MyChart) OR A paper copy in the mail If you have any lab test that is abnormal or we need to change your treatment, we will call you to review the results.   Testing/Procedures:   Your cardiac CT is scheduled at the below location:    [facility] [place], [place] [address] [phone]  [place] November 01, 2021 at 11:00 AM with arrival time of 10:45 AM for check-in and test prep.  Please follow these instructions carefully (unless otherwise directed):   On the Night Before the Test: Be sure to Drink plenty of water. Do not consume any caffeinated/decaffeinated beverages or chocolate 12 hours prior to your test. Do not take any antihistamines 12 hours prior to your test.   On the Day of the Test: Drink plenty of water until 1 hour prior to the test. Do not eat any food 4 hours prior to the test. You may take your regular medications prior to the test.  Take metoprolol (Lopressor) 25 mg two hours prior to test IF HEART RATE IS GREATER THAN 50. FEMALES- please wear underwire-free bra if available, avoid dresses & tight clothing       After the Test: Drink plenty of water. After receiving IV contrast, you may experience a mild flushed feeling. This is normal. On occasion, you may experience a mild rash up to 24 hours after the test. This is not dangerous. If this occurs, you can take Benadryl 25 mg and increase your fluid intake. If you experience trouble breathing, this can be serious. If it is severe call 911 IMMEDIATELY. If it is mild, please call our office. If you take any of these medications: Glipizide/Metformin, Avandament,  Glucavance, please do not take 48 hours after completing test unless otherwise instructed.   For non-scheduling related questions, please contact the cardiac imaging nurse navigator should you have any questions/concerns: Marchia Bond, Cardiac Imaging Nurse Navigator Gordy Clement, Cardiac Imaging Nurse Navigator [facility] Direct Office Dial: [phone]   For scheduling needs, including cancellations and rescheduling, please call [place], [phone].    Follow-Up: At [facility], you and your health needs are our priority.  As part of our continuing mission to provide you with exceptional heart care, we have created designated Provider Care Teams.  These Care Teams include your primary Cardiologist (physician) and Advanced Practice Providers (APPs -  Physician Assistants and Nurse Practitioners) who all work together to provide you with the care you need, when you need it.   Your next appointment:   2 month(s)  The format for your next appointment:   In Person  Provider:   Kathlyn Sacramento, MD or Christell Faith, PA-C     Signed, Eliya Bubar, Lanice Schwab, NP  10/23/2021 3:00 PM    [place]

## 2021-10-23 ENCOUNTER — Encounter: Payer: Self-pay | Admitting: Physician Assistant

## 2021-10-23 ENCOUNTER — Ambulatory Visit: Payer: Medicare PPO | Admitting: Nurse Practitioner

## 2021-10-23 ENCOUNTER — Other Ambulatory Visit: Payer: Self-pay

## 2021-10-23 VITALS — BP 140/70 | HR 54 | Ht 67.0 in | Wt 121.0 lb

## 2021-10-23 DIAGNOSIS — E785 Hyperlipidemia, unspecified: Secondary | ICD-10-CM

## 2021-10-23 DIAGNOSIS — R079 Chest pain, unspecified: Secondary | ICD-10-CM

## 2021-10-23 DIAGNOSIS — R002 Palpitations: Secondary | ICD-10-CM | POA: Diagnosis not present

## 2021-10-23 DIAGNOSIS — R072 Precordial pain: Secondary | ICD-10-CM

## 2021-10-23 MED ORDER — METOPROLOL TARTRATE 25 MG PO TABS: 25.0000 mg | ORAL_TABLET | Freq: Once | ORAL | 0 refills | Status: DC

## 2021-10-23 NOTE — Patient Instructions (Addendum)
Medication Instructions:  No changes at this time.  *If you need a refill on your cardiac medications before your next appointment, please call your pharmacy*   Lab Work: BMET today  If you have labs (blood work) drawn today and your tests are completely normal, you will receive your results only by: [place] (if you have MyChart) OR A paper copy in the mail If you have any lab test that is abnormal or we need to change your treatment, we will call you to review the results.   Testing/Procedures:   Your cardiac CT is scheduled at the below location:    [facility] [place], [place] [address] [phone]  [place] November 01, 2021 at 11:00 AM with arrival time of 10:45 AM for check-in and test prep.  Please follow these instructions carefully (unless otherwise directed):   On the Night Before the Test: Be sure to Drink plenty of water. Do not consume any caffeinated/decaffeinated beverages or chocolate 12 hours prior to your test. Do not take any antihistamines 12 hours prior to your test.   On the Day of the Test: Drink plenty of water until 1 hour prior to the test. Do not eat any food 4 hours prior to the test. You may take your regular medications prior to the test.  Take metoprolol (Lopressor) 25 mg two hours prior to test IF HEART RATE IS GREATER THAN 50. FEMALES- please wear underwire-free bra if available, avoid dresses & tight clothing       After the Test: Drink plenty of water. After receiving IV contrast, you may experience a mild flushed feeling. This is normal. On occasion, you may experience a mild rash up to 24 hours after the test. This is not dangerous. If this occurs, you can take Benadryl 25 mg and increase your fluid intake. If you experience trouble breathing, this can be serious. If it is severe call 911 IMMEDIATELY. If it is mild, please call our office. If  you take any of these medications: Glipizide/Metformin, Avandament, Glucavance, please do not take 48 hours after completing test unless otherwise instructed.   For non-scheduling related questions, please contact the cardiac imaging nurse navigator should you have any questions/concerns: Marchia Bond, Cardiac Imaging Nurse Navigator Gordy Clement, Cardiac Imaging Nurse Navigator [place] [facility] Direct Office Dial: [phone]   For scheduling needs, including cancellations and rescheduling, please call [place], [phone].    Follow-Up: At [facility], you and your health needs are our priority.  As part of our continuing mission to provide you with exceptional heart care, we have created designated Provider Care Teams.  These Care Teams include your primary Cardiologist (physician) and Advanced Practice Providers (APPs -  Physician Assistants and Nurse Practitioners) who all work together to provide you with the care you need, when you need it.   Your next appointment:   2 month(s)  The format for your next appointment:   In Person  Provider:   Kathlyn Sacramento, MD or Christell Faith, PA-C

## 2021-10-24 LAB — BASIC METABOLIC PANEL
BUN/Creatinine Ratio: 21 (ref 12–28)
BUN: 16 mg/dL (ref 8–27)
CO2: 25 mmol/L (ref 20–29)
Calcium: 8.8 mg/dL (ref 8.7–10.3)
Chloride: 102 mmol/L (ref 96–106)
Creatinine, Ser: 0.75 mg/dL (ref 0.57–1.00)
Glucose: 90 mg/dL (ref 70–99)
Potassium: 4.8 mmol/L (ref 3.5–5.2)
Sodium: 138 mmol/L (ref 134–144)
eGFR: 83 mL/min/{1.73_m2} (ref >59)

## 2021-10-30 ENCOUNTER — Telehealth (HOSPITAL_COMMUNITY): Payer: Self-pay | Admitting: *Deleted

## 2021-10-30 NOTE — Telephone Encounter (Signed)
Attempted to call patient regarding upcoming cardiac CT appointment. °Left message on voicemail with name and callback number ° °Liliah Dorian RN Navigator Cardiac Imaging °[facility] °[phone] Office °[phone] Cell ° °

## 2021-10-31 ENCOUNTER — Telehealth (HOSPITAL_COMMUNITY): Payer: Self-pay | Admitting: *Deleted

## 2021-10-31 NOTE — Telephone Encounter (Signed)
Reaching out to patient to offer assistance regarding upcoming cardiac imaging study; pt verbalizes understanding of appt date/time, parking situation and where to check in, pre-test NPO status and medications ordered, and verified current allergies; name and call back number provided for further questions should they arise  Gordy Clement RN Navigator Cardiac Imaging [facility] [phone] office [phone] cell  Patient to take her daily propanolol and will take 25mg  metoprolol tartrate two hours prior to her cardiac CT scan if her HR is greater than 60bpm.

## 2021-11-01 ENCOUNTER — Other Ambulatory Visit: Payer: Self-pay | Admitting: Nurse Practitioner

## 2021-11-01 ENCOUNTER — Other Ambulatory Visit: Payer: Self-pay

## 2021-11-01 ENCOUNTER — Ambulatory Visit
Admission: RE | Admit: 2021-11-01 | Discharge: 2021-11-01 | Disposition: A | Discharge status: Home / Self Care | Payer: Medicare PPO | Source: Ambulatory Visit | Attending: Nurse Practitioner | Admitting: Nurse Practitioner

## 2021-11-01 DIAGNOSIS — R072 Precordial pain: Secondary | ICD-10-CM | POA: Insufficient documentation

## 2021-11-01 DIAGNOSIS — R931 Abnormal findings on diagnostic imaging of heart and coronary circulation: Secondary | ICD-10-CM | POA: Diagnosis not present

## 2021-11-01 DIAGNOSIS — I2510 Atherosclerotic heart disease of native coronary artery without angina pectoris: Secondary | ICD-10-CM | POA: Diagnosis not present

## 2021-11-01 MED ORDER — IOHEXOL 350 MG/ML SOLN
75.0000 mL | Freq: Once | INTRAVENOUS | Status: AC | PRN
  Administered 2021-11-01: 75 mL via INTRAVENOUS

## 2021-11-01 MED ORDER — NITROGLYCERIN 0.4 MG SL SUBL
0.8000 mg | SUBLINGUAL_TABLET | Freq: Once | SUBLINGUAL | Status: AC
  Administered 2021-11-01: 0.8 mg via SUBLINGUAL

## 2021-11-01 NOTE — Progress Notes (Signed)
Patient tolerated procedure well. Ambulate w/o difficulty. Denies light headedness or being dizzy. Sitting in chair drinking water provided. Encouraged to drink extra water today and reasoning explained. Verbalized understanding. All questions answered. ABC intact. No further needs. Discharge from procedure area w/o issues.   °

## 2021-11-02 ENCOUNTER — Telehealth: Payer: Self-pay | Admitting: *Deleted

## 2021-11-02 ENCOUNTER — Encounter: Payer: Self-pay | Admitting: Cardiology

## 2021-11-02 ENCOUNTER — Ambulatory Visit: Payer: Medicare PPO | Admitting: Cardiology

## 2021-11-02 VITALS — BP 130/60 | HR 54 | Ht 64.0 in | Wt 120.0 lb

## 2021-11-02 DIAGNOSIS — R931 Abnormal findings on diagnostic imaging of heart and coronary circulation: Secondary | ICD-10-CM | POA: Diagnosis not present

## 2021-11-02 DIAGNOSIS — E7800 Pure hypercholesterolemia, unspecified: Secondary | ICD-10-CM

## 2021-11-02 DIAGNOSIS — I2510 Atherosclerotic heart disease of native coronary artery without angina pectoris: Secondary | ICD-10-CM

## 2021-11-02 NOTE — Patient Instructions (Signed)
Medication Instructions:   Your physician has recommended you make the following change in your medication:    START taking Isosorbide (IMDUR) 15 MG once a day.   *If you need a refill on your cardiac medications before your next appointment, please call your pharmacy*   Lab Work:  Your physician recommends that you return for a FASTING lipid profile: At your earliest convenience  - You will need to be fasting. Please do not have anything to eat or drink after midnight the morning you have the lab work. You may only have water or black coffee with no cream or sugar.   Please return to our office on _______________at__________am/pm     Testing/Procedures:  Your physician has requested that you have an echocardiogram. Echocardiography is a painless test that uses sound waves to create images of your heart. It provides your doctor with information about the size and shape of your heart and how well your hearts chambers and valves are working. This procedure takes approximately one hour. There are no restrictions for this procedure.    Follow-Up: At [facility], you and your health needs are our priority.  As part of our continuing mission to provide you with exceptional heart care, we have created designated Provider Care Teams.  These Care Teams include your primary Cardiologist (physician) and Advanced Practice Providers (APPs -  Physician Assistants and Nurse Practitioners) who all work together to provide you with the care you need, when you need it.  We recommend signing up for the patient portal called "MyChart".  Sign up information is provided on this After Visit Summary.  MyChart is used to connect with patients for Virtual Visits (Telemedicine).  Patients are able to view lab/test results, encounter notes, upcoming appointments, etc.  Non-urgent messages can be sent to your provider as well.   To learn more about what you can do with MyChart, go to [email or web address].     Your next appointment:   Follow up after Echo   The format for your next appointment:   In Person  Provider:   You may see Kathlyn Sacramento, MD or one of the following Advanced Practice Providers on your designated Care Team:   Murray Hodgkins, NP Christell Faith, PA-C [place] Kathlen Mody, [place]    Other Instructions

## 2021-11-02 NOTE — Progress Notes (Signed)
Cardiology Office Note:    Date:  11/02/2021   ID:  Katie [name], DOB [birth year], MRN [id number]  PCP:  McLean-Scocuzza, Nino Glow, MD   [facility] Providers Cardiologist:  Kathlyn Sacramento, MD     Referring MD: McLean-Scocuzza, Olivia Mackie *   Chief Complaint  Patient presents with   Other    Follow up post Abnormal CCTA -- Meds reviewed verbally with patient.     History of Present Illness:    Katie [name] is a 76 y.o. female with a hx of hyperlipidemia, palpitations on propanolol who presents for follow-up due to abnormal coronary CTA.  Patient had a coronary CTA due to symptoms of chest pain.  She states having symptoms of chest pain while driving a month ago.  Chest pain was associated with diaphoresis.  Coronary CTA showed moderate mid LAD disease was noted.  FFR CT showed mild focal stenosis in the mid LAD, FFR CT 0.78.  Since then, she has had occasional nonspecific chest discomfort.  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  Previously did not tolerate Lipitor, currently tolerating simvastatin and Zetia.  Also endorsed eating healthier.    Past Medical History:  Diagnosis Date   Anxiety    Bile duct stricture    Breast mass, left    Choledocholithiasis    recurrent   COVID-19    09/30/20, 10/07/20    GERD (gastroesophageal reflux disease)    Hypertension    Migraine without aura, without mention of intractable migraine without mention of status migrainosus 01/03/2014   Rapid heart beat    Rotaviral gastroenteritis    12/2020    Past Surgical History:  Procedure Laterality Date   ABDOMINAL HYSTERECTOMY     BLADDER SURGERY     x 2   BREAST EXCISIONAL BIOPSY Right 2003   benign   BREAST LUMPECTOMY     right   BREAST LUMPECTOMY WITH RADIOACTIVE SEED LOCALIZATION Left 03/31/2017   Procedure: LEFT BREAST LUMPECTOMY WITH RADIOACTIVE SEED LOCALIZATION;  Surgeon: Coralie Keens, MD;  Location: [facility];  Service: General;  Laterality: Left;    CHOLECYSTECTOMY     COLONOSCOPY     Explor Lap, Hepatoduodenostomy  07/23/11   Dr Lazarus Gowda, [facility]   FOOT SURGERY     both feet   GASTRIC BYPASS  05/04/2013   PARTIAL HYSTERECTOMY     TUBAL LIGATION     VEIN LIGATION AND STRIPPING     left leg   WRIST SURGERY     right    Current Medications: Current Meds  Medication Sig   aspirin 81 MG tablet Take 81 mg by mouth daily.   Biotin 1 MG CAPS Take 1 mg by mouth daily.    CHOLECALCIFEROL PO Take by mouth daily.   CYANOCOBALAMIN IJ Inject as directed every 30 (thirty) days.   denosumab (PROLIA) 60 MG/ML SOSY injection Inject 60 mg into the skin every 6 (six) months.   esomeprazole (NEXIUM) 40 MG capsule Take 1 capsule (40 mg total) by mouth 2 (two) times daily before a meal. 30 minutes before food   ezetimibe (ZETIA) 10 MG tablet TAKE 1 TABLET(10 MG) BY MOUTH DAILY   Ferrous Sulfate (IRON PO) Take 1 tablet by mouth every other day. OTC   Multiple Vitamin (MULTI-VITAMINS) TABS Take 1 tablet by mouth daily.    Multiple Vitamins-Minerals (OCUVITE PRESERVISION PO) Take by mouth 2 (two) times daily.   ondansetron (ZOFRAN) 4 MG tablet Take 1 tablet (4  mg total) by mouth every 8 (eight) hours as needed for nausea or vomiting.   propranolol (INDERAL) 40 MG tablet Take 1 tablet (40 mg total) by mouth 2 (two) times daily.   simvastatin (ZOCOR) 40 MG tablet TAKE 1 TABLET(40 MG) BY MOUTH DAILY AT 6 PM   ursodiol (ACTIGALL) 250 MG tablet TAKE 1 TABLET(250 MG) BY MOUTH THREE TIMES DAILY   venlafaxine (EFFEXOR) 37.5 MG tablet TAKE 2 TABLETS BY MOUTH EVERY MORNING AND 1 TABLET EVERY EVENING     Allergies:   Lipitor [atorvastatin calcium]   Social History   Socioeconomic History   Marital status: Married    Spouse name: Not on file   Number of children: 2   Years of education: college   Highest education level: Not on file  Occupational History   Occupation: retired    Fish farm manager: RETIRED  Tobacco Use   Smoking status: Never   Smokeless tobacco:  Never  Vaping Use   Vaping Use: Never used  Substance and Sexual Activity   Alcohol use: No   Drug use: No   Sexual activity: Yes  Other Topics Concern   Not on file  Social History Narrative   Does not have a living will or HPOA.   Desires CPR.   Does not want prolonged life support.   Social Determinants of Health   Financial Resource Strain: Not on file  Food Insecurity: Not on file  Transportation Needs: Not on file  Physical Activity: Not on file  Stress: Not on file  Social Connections: Not on file     Family History: The patient's family history includes Alcohol abuse in her father; Anxiety disorder in her sister; Cancer in her mother; Depression in her sister; GER disease in her brother and sister; Heart attack in her sister; Hypertension in her sister. There is no history of Migraines or Breast cancer.  ROS:   Please see the history of present illness.     All other systems reviewed and are negative.  EKGs/Labs/Other Studies Reviewed:    The following studies were reviewed today:   EKG:  EKG not  ordered today.    Recent Labs: 07/27/2021: ALT 12; Hemoglobin 12.1; Platelets 250.0; TSH 2.64 10/23/2021: BUN 16; Creatinine, Ser 0.75; Potassium 4.8; Sodium 138  Recent Lipid Panel    Component Value Date/Time   CHOL 200 07/27/2021 1013   TRIG 179.0 (H) 07/27/2021 1013   HDL 59.30 07/27/2021 1013   CHOLHDL 3 07/27/2021 1013   VLDL 35.8 07/27/2021 1013   LDLCALC 105 (H) 07/27/2021 1013   LDLCALC 111 (H) 06/23/2020 1523   LDLDIRECT 144.7 08/26/2014 1046     Risk Assessment/Calculations:          Physical Exam:    VS:  BP 130/60 (BP Location: Left Arm, Patient Position: Sitting, Cuff Size: Normal)    Pulse (!) 54    Ht 5\' 4"  (1.626 m)    Wt 120 lb (54.4 kg)    SpO2 99%    BMI 20.60 kg/m     Wt Readings from Last 3 Encounters:  11/02/21 120 lb (54.4 kg)  10/23/21 121 lb (54.9 kg)  07/27/21 121 lb 3.2 oz (55 kg)     GEN:  Well nourished, well  developed in no acute distress HEENT: Normal NECK: No JVD; No carotid bruits LYMPHATICS: No lymphadenopathy CARDIAC: RRR, no murmurs, rubs, gallops RESPIRATORY:  Clear to auscultation without rales, wheezing or rhonchi  ABDOMEN: Soft, non-tender, non-distended MUSCULOSKELETAL:  No edema;  No deformity  SKIN: Warm and dry NEUROLOGIC:  Alert and oriented x 3 PSYCHIATRIC:  Normal affect   ASSESSMENT:    1. Coronary artery disease involving native coronary artery of native heart, unspecified whether angina present   2. Pure hypercholesterolemia    PLAN:    In order of problems listed above:  Moderate mid LAD disease, CT FFR 0.78 nonfocal stenosis.  Start Imdur 15 mg daily for antianginal benefit.  Continue aspirin, simvastatin, Zetia.  Obtain echocardiogram.  Consider left heart cath if chest pain persists despite antianginals.  Heart rate 54. Hyperlipidemia, obtain fasting lipid profile.  Continue Zetia, simvastatin.  Previously did not tolerate Lipitor.  Follow-up after echocardiogram with APP or Dr. Fletcher Anon       Medication Adjustments/Labs and Tests Ordered: Current medicines are reviewed at length with the patient today.  Concerns regarding medicines are outlined above.  Orders Placed This Encounter  Procedures   Lipid panel   ECHOCARDIOGRAM COMPLETE   No orders of the defined types were placed in this encounter.   Patient Instructions  Medication Instructions:   Your physician has recommended you make the following change in your medication:    START taking Isosorbide (IMDUR) 15 MG once a day.   *If you need a refill on your cardiac medications before your next appointment, please call your pharmacy*   Lab Work:  Your physician recommends that you return for a FASTING lipid profile: At your earliest convenience  - You will need to be fasting. Please do not have anything to eat or drink after midnight the morning you have the lab work. You may only have water or  black coffee with no cream or sugar.   Please return to our office on _______________at__________am/pm     Testing/Procedures:  Your physician has requested that you have an echocardiogram. Echocardiography is a painless test that uses sound waves to create images of your heart. It provides your doctor with information about the size and shape of your heart and how well your hearts chambers and valves are working. This procedure takes approximately one hour. There are no restrictions for this procedure.    Follow-Up: At [facility], you and your health needs are our priority.  As part of our continuing mission to provide you with exceptional heart care, we have created designated Provider Care Teams.  These Care Teams include your primary Cardiologist (physician) and Advanced Practice Providers (APPs -  Physician Assistants and Nurse Practitioners) who all work together to provide you with the care you need, when you need it.  We recommend signing up for the patient portal called "MyChart".  Sign up information is provided on this After Visit Summary.  MyChart is used to connect with patients for Virtual Visits (Telemedicine).  Patients are able to view lab/test results, encounter notes, upcoming appointments, etc.  Non-urgent messages can be sent to your provider as well.   To learn more about what you can do with MyChart, go to [email or web address].    Your next appointment:   Follow up after Echo   The format for your next appointment:   In Person  Provider:   You may see Kathlyn Sacramento, MD or one of the following Advanced Practice Providers on your designated Care Team:   Murray Hodgkins, NP Christell Faith, PA-C Cadence Kathlen Mody, [place]    Other Instructions      Signed, Kate Sable, MD  11/02/2021 5:03 PM    [place]

## 2021-11-02 NOTE — Telephone Encounter (Signed)
Spoke with patient and attempted to schedule her next week. She is going out of town for a week. Scheduled for today with Dr. Garen Lah to review results and discuss further recommendations. She was agreeable with appointment with no further questions at this time.

## 2021-11-02 NOTE — Telephone Encounter (Signed)
-----   Message from Emmaline Life, NP sent at 11/02/2021 12:13 PM EST ----- CT shows some moderate CAD in the LAD. The FFR revealed that flow is slightly below normal range. If she is still having symptoms of chest pain, she needs to have a soon appointment to discuss cardiac catheterization. We need to maximize risk reduction with lower LDL. Would like her to stop simvastatin and start rosuvastatin 20 mg daily. Please send a prescription for sublingual NTG for her to use for any additional episodes of chest pain. Continue aspirin and propranolol.

## 2021-11-02 NOTE — Telephone Encounter (Signed)
Left voicemail message to call back for review of results.  

## 2021-11-12 ENCOUNTER — Telehealth: Payer: Self-pay | Admitting: Cardiology

## 2021-11-12 MED ORDER — ISOSORBIDE MONONITRATE ER 30 MG PO TB24: 15.0000 mg | ORAL_TABLET | Freq: Every day | ORAL | 3 refills | Status: DC

## 2021-11-12 NOTE — Telephone Encounter (Signed)
Pt c/o medication issue:  1. Name of Medication: imdur   2. How are you currently taking this medication (dosage and times per day)? Not started   3. Are you having a reaction (difficulty breathing--STAT)? No   4. What is your medication issue? Pharmacy never received rx and patient wants to check to see if md changed his mind on starting it   Patient seen by [facility] but is an [place] patient

## 2021-11-12 NOTE — Telephone Encounter (Signed)
Called patient and apologized that the medication was not sent in. I informed her that I did just send it in to her pharmacy. Patient understood and was very grateful for the call back.

## 2021-11-15 ENCOUNTER — Other Ambulatory Visit: Payer: Self-pay

## 2021-11-15 ENCOUNTER — Ambulatory Visit (INDEPENDENT_AMBULATORY_CARE_PROVIDER_SITE_OTHER): Payer: Medicare PPO

## 2021-11-15 DIAGNOSIS — I2510 Atherosclerotic heart disease of native coronary artery without angina pectoris: Secondary | ICD-10-CM | POA: Diagnosis not present

## 2021-11-16 ENCOUNTER — Other Ambulatory Visit (INDEPENDENT_AMBULATORY_CARE_PROVIDER_SITE_OTHER): Payer: Medicare PPO

## 2021-11-16 DIAGNOSIS — E7800 Pure hypercholesterolemia, unspecified: Secondary | ICD-10-CM | POA: Diagnosis not present

## 2021-11-16 LAB — ECHOCARDIOGRAM COMPLETE
AR max vel: 2.44 cm2
AV Area VTI: 2.13 cm2
AV Area mean vel: 2.26 cm2
AV Mean grad: 3 mmHg
AV Peak grad: 6 mmHg
Ao pk vel: 1.23 m/s
Area-P 1/2: 3.79 cm2
Calc EF: 60.3 %
S' Lateral: 2.8 cm
Single Plane A2C EF: 62.2 %
Single Plane A4C EF: 60.2 %

## 2021-11-17 LAB — LIPID PANEL
Chol/HDL Ratio: 2.9 ratio (ref 0.0–4.4)
Cholesterol, Total: 186 mg/dL (ref 100–199)
HDL: 64 mg/dL (ref >39)
LDL Chol Calc (NIH): 101 mg/dL — ABNORMAL HIGH (ref 0–99)
Triglycerides: 117 mg/dL (ref 0–149)
VLDL Cholesterol Cal: 21 mg/dL (ref 5–40)

## 2021-11-30 ENCOUNTER — Other Ambulatory Visit: Payer: Medicare PPO

## 2021-12-04 DIAGNOSIS — R059 Cough, unspecified: Secondary | ICD-10-CM | POA: Diagnosis not present

## 2021-12-04 DIAGNOSIS — J0100 Acute maxillary sinusitis, unspecified: Secondary | ICD-10-CM | POA: Diagnosis not present

## 2021-12-04 DIAGNOSIS — Z20822 Contact with and (suspected) exposure to covid-19: Secondary | ICD-10-CM | POA: Diagnosis not present

## 2021-12-10 DIAGNOSIS — M19011 Primary osteoarthritis, right shoulder: Secondary | ICD-10-CM | POA: Diagnosis not present

## 2021-12-10 DIAGNOSIS — M7551 Bursitis of right shoulder: Secondary | ICD-10-CM | POA: Diagnosis not present

## 2021-12-10 DIAGNOSIS — M778 Other enthesopathies, not elsewhere classified: Secondary | ICD-10-CM | POA: Diagnosis not present

## 2021-12-10 DIAGNOSIS — G8929 Other chronic pain: Secondary | ICD-10-CM | POA: Diagnosis not present

## 2021-12-10 DIAGNOSIS — M7541 Impingement syndrome of right shoulder: Secondary | ICD-10-CM | POA: Diagnosis not present

## 2021-12-10 DIAGNOSIS — M25511 Pain in right shoulder: Secondary | ICD-10-CM | POA: Diagnosis not present

## 2021-12-13 ENCOUNTER — Ambulatory Visit (INDEPENDENT_AMBULATORY_CARE_PROVIDER_SITE_OTHER): Payer: Medicare PPO

## 2021-12-13 VITALS — Ht 64.0 in | Wt 120.0 lb

## 2021-12-13 DIAGNOSIS — Z0000 Encounter for general adult medical examination without abnormal findings: Secondary | ICD-10-CM

## 2021-12-13 NOTE — Progress Notes (Signed)
Subjective:   Katie [name] is a 76 y.o. female who presents for Medicare Annual (Subsequent) preventive examination.  Review of Systems    No ROS.  Medicare Wellness Virtual Visit.  Visual/audio telehealth visit, UTA vital signs.   See social history for additional risk factors.   Cardiac Risk Factors include: advanced age (>9men, >26 women)     Objective:    Today's Vitals   12/13/21 1531  Weight: 120 lb (54.4 kg)  Height: 5\' 4"  (1.626 m)   Body mass index is 20.6 kg/m.     12/13/2021    3:39 PM 05/05/2020    9:45 AM 03/31/2017    9:19 AM 04/19/2016    9:34 AM 02/03/2015   10:47 AM  Advanced Directives  Does Patient Have a Medical Advance Directive? No  No No No  Does patient want to make changes to medical advance directive?  No - Patient declined     Would patient like information on creating a medical advance directive? No - Patient declined  No - Patient declined Yes - Educational materials given Yes - Educational materials given    Current Medications (verified) Outpatient Encounter Medications as of 12/13/2021  Medication Sig   aspirin 81 MG tablet Take 81 mg by mouth daily.   Biotin 1 MG CAPS Take 1 mg by mouth daily.    CHOLECALCIFEROL PO Take by mouth daily.   CYANOCOBALAMIN IJ Inject as directed every 30 (thirty) days.   denosumab (PROLIA) 60 MG/ML SOSY injection Inject 60 mg into the skin every 6 (six) months.   esomeprazole (NEXIUM) 40 MG capsule Take 1 capsule (40 mg total) by mouth 2 (two) times daily before a meal. 30 minutes before food   ezetimibe (ZETIA) 10 MG tablet TAKE 1 TABLET(10 MG) BY MOUTH DAILY   Ferrous Sulfate (IRON PO) Take 1 tablet by mouth every other day. OTC   isosorbide mononitrate (IMDUR) 30 MG 24 hr tablet Take 0.5 tablets (15 mg total) by mouth daily.   Multiple Vitamin (MULTI-VITAMINS) TABS Take 1 tablet by mouth daily.    Multiple Vitamins-Minerals (OCUVITE PRESERVISION PO) Take by mouth 2 (two) times daily.   ondansetron (ZOFRAN)  4 MG tablet Take 1 tablet (4 mg total) by mouth every 8 (eight) hours as needed for nausea or vomiting.   propranolol (INDERAL) 40 MG tablet Take 1 tablet (40 mg total) by mouth 2 (two) times daily.   simvastatin (ZOCOR) 40 MG tablet TAKE 1 TABLET(40 MG) BY MOUTH DAILY AT 6 PM   ursodiol (ACTIGALL) 250 MG tablet TAKE 1 TABLET(250 MG) BY MOUTH THREE TIMES DAILY   venlafaxine (EFFEXOR) 37.5 MG tablet TAKE 2 TABLETS BY MOUTH EVERY MORNING AND 1 TABLET EVERY EVENING   No facility-administered encounter medications on file as of 12/13/2021.    Allergies (verified) Lipitor [atorvastatin calcium]   History: Past Medical History:  Diagnosis Date   Anxiety    Bile duct stricture    Breast mass, left    Choledocholithiasis    recurrent   COVID-19    09/30/20, 10/07/20    GERD (gastroesophageal reflux disease)    Hypertension    Migraine without aura, without mention of intractable migraine without mention of status migrainosus 01/03/2014   Rapid heart beat    Rotaviral gastroenteritis    12/2020   Past Surgical History:  Procedure Laterality Date   ABDOMINAL HYSTERECTOMY     BLADDER SURGERY     x 2   BREAST EXCISIONAL BIOPSY Right  2003   benign   BREAST LUMPECTOMY     right   BREAST LUMPECTOMY WITH RADIOACTIVE SEED LOCALIZATION Left 03/31/2017   Procedure: LEFT BREAST LUMPECTOMY WITH RADIOACTIVE SEED LOCALIZATION;  Surgeon: Abigail Miyamoto, MD;  Location: [facility] OR;  Service: General;  Laterality: Left;   CHOLECYSTECTOMY     COLONOSCOPY     Explor Lap, Hepatoduodenostomy  07/23/11   Dr Madaline Brilliant, [facility]   FOOT SURGERY     both feet   GASTRIC BYPASS  05/04/2013   PARTIAL HYSTERECTOMY     TUBAL LIGATION     VEIN LIGATION AND STRIPPING     left leg   WRIST SURGERY     right   Family History  Problem Relation Age of Onset   Cancer Mother        lung, smoker   Alcohol abuse Father    GER disease Sister    Heart attack Sister    Depression Sister    Anxiety disorder Sister     Hypertension Sister    GER disease Brother    Migraines Neg Hx    Breast cancer Neg Hx    Social History   Socioeconomic History   Marital status: Married    Spouse name: Not on file   Number of children: 2   Years of education: college   Highest education level: Not on file  Occupational History   Occupation: retired    Associate Professor: RETIRED  Tobacco Use   Smoking status: Never   Smokeless tobacco: Never  Vaping Use   Vaping Use: Never used  Substance and Sexual Activity   Alcohol use: No   Drug use: No   Sexual activity: Yes  Other Topics Concern   Not on file  Social History Narrative   Does not have a living will or HPOA.   Desires CPR.   Does not want prolonged life support.   Social Determinants of Health   Financial Resource Strain: Low Risk    Difficulty of Paying Living Expenses: Not hard at all  Food Insecurity: No Food Insecurity   Worried About Programme researcher, broadcasting/film/video in the Last Year: Never true   Ran Out of Food in the Last Year: Never true  Transportation Needs: No Transportation Needs   Lack of Transportation (Medical): No   Lack of Transportation (Non-Medical): No  Physical Activity: Sufficiently Active   Days of Exercise per Week: 5 days   Minutes of Exercise per Session: 50 min  Stress: No Stress Concern Present   Feeling of Stress : Not at all  Social Connections: Unknown   Frequency of Communication with Friends and Family: More than three times a week   Frequency of Social Gatherings with Friends and Family: More than three times a week   Attends Religious Services: Not on Scientist, clinical (histocompatibility and immunogenetics) or Organizations: Not on file   Attends Banker Meetings: Not on file   Marital Status: Married    Tobacco Counseling Counseling given: Not Answered   Clinical Intake:  Pre-visit preparation completed: Yes        Diabetes: No  How often do you need to have someone help you when you read instructions, pamphlets, or other  written materials from your doctor or pharmacy?: 1 - Never     Activities of Daily Living    12/13/2021    3:47 PM  In your present state of health, do you have any difficulty performing the following activities:  Hearing? 1  Comment Left ear deafness. Does not wear hearing aids.  Vision? 0  Difficulty concentrating or making decisions? 0  Walking or climbing stairs? 0  Dressing or bathing? 0  Doing errands, shopping? 0  Preparing Food and eating ? N  Using the Toilet? N  In the past six months, have you accidently leaked urine? N  Do you have problems with loss of bowel control? N  Managing your Medications? N  Managing your Finances? N  Housekeeping or managing your Housekeeping? N    Patient Care Team: McLean-Scocuzza, Pasty Spillers, MD as PCP - General (Internal Medicine) Iran Ouch, MD as PCP - Cardiology (Cardiology) York Spaniel, MD (Inactive) as Consulting Physician (Neurology) Venancio Poisson, MD as Consulting Physician (Dermatology) Richarda Overlie, MD as Consulting Physician (Obstetrics and Gynecology) Blair Promise, OD as Consulting Physician (Optometry)  Indicate any recent Medical Services you may have received from other than [facility] providers in the past year (date may be approximate).     Assessment:   This is a routine wellness examination for Katie [name].  Virtual Visit via Telephone Note  I connected with  Katie [name] on 12/13/21 at  3:30 PM EDT by telephone and verified that I am speaking with the correct person using two identifiers.  Persons participating in the virtual visit: patient/Nurse Health Advisor   I discussed the limitations of performing an evaluation and management service by telehealth. The patient expressed understanding and agreed to proceed. We continued and completed visit with audio only. Some vital signs may be absent or patient reported.   Hearing/Vision screen Hearing Screening - Comments:: Patient is able to hear  conversational tones without difficulty.  No issues reported.   Vision Screening - Comments::  Followed by [facility] Wears corrective lenses Visual acuity not assessed, virtual visit. They have seen their ophthalmologist in the last 12 months  Dietary issues and exercise activities discussed: Current Exercise Habits: Home exercise routine, Intensity: Mild Healthy diet Good water intake   Goals Addressed             This Visit's Progress    Follow up with Primary Care Provider       As needed.       Depression Screen    12/13/2021    3:36 PM 07/27/2021    9:26 AM 01/26/2021    9:46 AM 10/10/2020    7:27 AM 06/21/2020    2:26 PM 05/05/2020    9:44 AM 11/24/2019    1:46 PM  PHQ 2/9 Scores  PHQ - 2 Score 0 0 0 0 0 0 0  PHQ- 9 Score  0 0 0       Fall Risk    12/13/2021    3:47 PM 01/26/2021    9:46 AM 01/02/2021    3:52 PM 12/22/2020    8:51 AM 10/10/2020    7:27 AM  Fall Risk   Falls in the past year? 0 0 0 0 0  Number falls in past yr: 0 0 0 0 0  Injury with Fall?  0 0 0 0  Follow up Falls evaluation completed Falls evaluation completed Falls evaluation completed Falls evaluation completed Falls evaluation completed   FALL RISK PREVENTION PERTAINING TO THE HOME: Home free of loose throw rugs in walkways, pet beds, electrical cords, etc? Yes  Adequate lighting in your home to reduce risk of falls? Yes   ASSISTIVE DEVICES UTILIZED TO PREVENT FALLS: Life alert? No  Use of  a cane, walker or w/c? No   TIMED UP AND GO: Was the test performed? No .   Cognitive Function: Patient is alert and oriented x3.  No difficulty managing her own medications and finances. Enjoys socializing and works with the public.     04/19/2016    9:38 AM  MMSE - Mini Mental State Exam  Orientation to time 5  Orientation to Place 5  Registration 3  Attention/ Calculation 0  Recall 3  Language- name 2 objects 0  Language- repeat 1  Language- follow 3 step command 3  Language-  read & follow direction 0  Write a sentence 0  Copy design 0  Total score 20        05/05/2020    9:44 AM  6CIT Screen  What Year? 0 points  What month? 0 points  What time? 0 points    Immunizations Immunization History  Administered Date(s) Administered   Fluad Quad(high Dose 65+) 07/27/2021   Influenza, High Dose Seasonal PF 06/16/2020   Influenza,inj,Quad PF,6+ Mos 08/02/2015   Influenza-Unspecified 06/24/2014, 06/17/2019   Moderna Sars-Covid-2 Vaccination 09/27/2019, 10/25/2019, 08/30/2020   Pneumococcal Conjugate-13 08/24/2014   Pneumococcal Polysaccharide-23 04/19/2016   Td 12/08/2008   Zoster, Live 07/19/2015   TDAP status: Due, Education has been provided regarding the importance of this vaccine. Advised may receive this vaccine at local pharmacy or Health Dept. Aware to provide a copy of the vaccination record if obtained from local pharmacy or Health Dept. Verbalized acceptance and understanding. Deferred.   Shingrix Completed?: No.    Education has been provided regarding the importance of this vaccine. Patient has been advised to call insurance company to determine out of pocket expense if they have not yet received this vaccine. Advised may also receive vaccine at local pharmacy or Health Dept. Verbalized acceptance and understanding.  Screening Tests Health Maintenance  Topic Date Due   TETANUS/TDAP  02/14/2022 (Originally 12/09/2018)   Zoster Vaccines- Shingrix (1 of 2) 03/15/2022 (Originally 06/07/1997)   COVID-19 Vaccine (4 - Booster for Moderna series) 07/16/2022 (Originally 10/25/2020)   MAMMOGRAM  05/09/2023   COLONOSCOPY (Pts 45-26yrs Insurance coverage will need to be confirmed)  05/20/2028   Pneumonia Vaccine 40+ Years old  Completed   INFLUENZA VACCINE  Completed   DEXA SCAN  Completed   Hepatitis C Screening  Completed   HPV VACCINES  Aged Out   Health Maintenance There are no preventive care reminders to display for this patient.  Lung Cancer  Screening: (Low Dose CT Chest recommended if Age 24-80 years, 30 pack-year currently smoking OR have quit w/in 15years.) does not qualify.   Vision Screening: Recommended annual ophthalmology exams for early detection of glaucoma and other disorders of the eye.  Dental Screening: Recommended annual dental exams for proper oral hygiene  Community Resource Referral / Chronic Care Management: CRR required this visit?  No   CCM required this visit?  No      Plan:   Keep all routine maintenance appointments.   I have personally reviewed and noted the following in the patient's chart:   Medical and social history Use of alcohol, tobacco or illicit drugs  Current medications and supplements including opioid prescriptions.  Functional ability and status Nutritional status Physical activity Advanced directives List of other physicians Hospitalizations, surgeries, and ER visits in previous 12 months Vitals Screenings to include cognitive, depression, and falls Referrals and appointments  In addition, I have reviewed and discussed with patient certain preventive protocols, quality  metrics, and best practice recommendations. A written personalized care plan for preventive services as well as general preventive health recommendations were provided to patient.     Ashok Pall, LPN   1/61/0960

## 2021-12-13 NOTE — Patient Instructions (Addendum)
?  Katie [name] , ?Thank you for taking time to come for your Medicare Wellness Visit. I appreciate your ongoing commitment to your health goals. Please review the following plan we discussed and let me know if I can assist you in the future.  ? ?These are the goals we discussed: ? Goals   ? ?  Follow up with Primary Care Provider   ?  As needed. ?  ? ?  ?  ?This is a list of the screening recommended for you and due dates:  ?Health Maintenance  ?Topic Date Due  ? Tetanus Vaccine  02/14/2022*  ? Zoster (Shingles) Vaccine (1 of 2) 03/15/2022*  ? COVID-19 Vaccine (4 - Booster for Moderna series) 07/16/2022*  ? Mammogram  05/09/2023  ? Colon Cancer Screening  05/20/2028  ? Pneumonia Vaccine  Completed  ? Flu Shot  Completed  ? DEXA scan (bone density measurement)  Completed  ? Hepatitis C Screening: USPSTF Recommendation to screen - Ages 52-79 yo.  Completed  ? HPV Vaccine  Aged Out  ?*Topic was postponed. The date shown is not the original due date.  ?  ?

## 2021-12-27 NOTE — Progress Notes (Signed)
? ?Cardiology Office Note   ? ?Date:  12/28/2021  ? ?ID:  Katie [name], DOB [birth year], MRN [id number] ? ?PCP:  McLean-Scocuzza, Nino Glow, MD  ?Cardiologist:  Kathlyn Sacramento, MD  ?Electrophysiologist:  None  ? ?Chief Complaint: Follow-up ? ?History of Present Illness:  ? ?Katie [name] is a 76 y.o. female with history of CAD medically managed as outlined below, palpitations, HTN, HLD, and GERD who presents for follow-up of her CAD. ? ?She was evaluated by Dr. Fletcher Anon in 08/2020 at the request of her PCP for pain in the left breast area.  At that time, she reported a history of palpitations that were controlled with propanolol as well as previous bilateral breast surgery with soreness in the left breast over the preceding several months.  Pain was occurring with rest and not related to exertion.  Area was tender to palpation.  EKG was without ischemic changes.  Cardiac chest pain was felt to be unlikely and she was advised to follow-up with her PCP.  More recently, she was evaluated in 10/2021 for sharp chest pain that occurred in the center of her chest while driving and was accompanied by diaphoresis.  She also continued to describe an ache in her left breast that had been present for many years with unrevealing work-up.  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  Subsequent coronary CTA demonstrated a calcium score of 32.7 which was the 47th percentile with moderate stenosis involving the LAD with CT FFR of the mid LAD being positive at 0.78.  She was subsequently evaluated by Dr. Garen Lah on 11/02/2021 and continued to note occasional chest discomfort.  She was started on Imdur 15 mg.  Subsequent echo on 11/15/2021 demonstrated an EF of 60 to 65%, no regional wall motion abnormalities, grade 1 diastolic dysfunction, normal RV systolic function and ventricular cavity size, mild to moderate mitral regurgitation, aortic valve sclerosis without evidence of stenosis, and an estimated right atrial pressure of 3  mmHg. ? ?She comes in doing well from a cardiac perspective.  Since she was last seen, following initiation of isosorbide mononitrate 15 mg, she has not had any further chest pain.  She is without symptoms of decompensation.  No dyspnea, palpitations, diaphoresis, dizziness, presyncope, or syncope.  She does note a longstanding history of right upper extremity and shoulder discomfort.  She has been evaluated by orthopedics with concern for bursitis.  She has been prescribed a course of physical therapy.  This right shoulder discomfort is exacerbated with certain rotational and arm lifting movements.  It is improved with Tylenol.  She is very pleased with her improvement in symptoms.  She is tolerating medications without issues. ? ? ?Labs independently reviewed: ?11/2021 - TC 186, TG 117, HDL 64, LDL 101 ?10/2021 - BUN 16, serum creatinine 0.75, potassium 4.8 ?07/2021 - TSH normal, Hgb 12.1, PLT 250, albumin 4.1, AST/ALT normal ? ?Past Medical History:  ?Diagnosis Date  ? Anxiety   ? Bile duct stricture   ? Breast mass, left   ? Choledocholithiasis   ? recurrent  ? COVID-19   ? 09/30/20, 10/07/20   ? GERD (gastroesophageal reflux disease)   ? Hypertension   ? Migraine without aura, without mention of intractable migraine without mention of status migrainosus 01/03/2014  ? Rapid heart beat   ? Rotaviral gastroenteritis   ? 12/2020  ? ? ?Past Surgical History:  ?Procedure Laterality Date  ? ABDOMINAL HYSTERECTOMY    ? BLADDER SURGERY    ?  x 2  ? BREAST EXCISIONAL BIOPSY Right 2003  ? benign  ? BREAST LUMPECTOMY    ? right  ? BREAST LUMPECTOMY WITH RADIOACTIVE SEED LOCALIZATION Left 03/31/2017  ? Procedure: LEFT BREAST LUMPECTOMY WITH RADIOACTIVE SEED LOCALIZATION;  Surgeon: Coralie Keens, MD;  Location: [facility];  Service: General;  Laterality: Left;  ? CHOLECYSTECTOMY    ? COLONOSCOPY    ? Explor Lap, Hepatoduodenostomy  07/23/11  ? Dr Lazarus Gowda, [facility]  ? FOOT SURGERY    ? both feet  ? GASTRIC BYPASS  05/04/2013  ? PARTIAL  HYSTERECTOMY    ? TUBAL LIGATION    ? VEIN LIGATION AND STRIPPING    ? left leg  ? WRIST SURGERY    ? right  ? ? ?Current Medications: ?Current Meds  ?Medication Sig  ? aspirin 81 MG tablet Take 81 mg by mouth daily.  ? Biotin 1 MG CAPS Take 1 mg by mouth daily.   ? CHOLECALCIFEROL PO Take by mouth daily.  ? CYANOCOBALAMIN IJ Inject as directed every 30 (thirty) days.  ? denosumab (PROLIA) 60 MG/ML SOSY injection Inject 60 mg into the skin every 6 (six) months.  ? esomeprazole (NEXIUM) 40 MG capsule Take 1 capsule (40 mg total) by mouth 2 (two) times daily before a meal. 30 minutes before food  ? ezetimibe (ZETIA) 10 MG tablet TAKE 1 TABLET(10 MG) BY MOUTH DAILY  ? Ferrous Sulfate (IRON PO) Take 1 tablet by mouth every other day. OTC  ? isosorbide mononitrate (IMDUR) 30 MG 24 hr tablet Take 0.5 tablets (15 mg total) by mouth daily.  ? Multiple Vitamin (MULTI-VITAMINS) TABS Take 1 tablet by mouth daily.   ? Multiple Vitamins-Minerals (OCUVITE PRESERVISION PO) Take by mouth 2 (two) times daily.  ? ondansetron (ZOFRAN) 4 MG tablet Take 1 tablet (4 mg total) by mouth every 8 (eight) hours as needed for nausea or vomiting.  ? propranolol (INDERAL) 40 MG tablet Take 1 tablet (40 mg total) by mouth 2 (two) times daily.  ? simvastatin (ZOCOR) 40 MG tablet TAKE 1 TABLET(40 MG) BY MOUTH DAILY AT 6 PM  ? ursodiol (ACTIGALL) 250 MG tablet TAKE 1 TABLET(250 MG) BY MOUTH THREE TIMES DAILY  ? venlafaxine (EFFEXOR) 37.5 MG tablet TAKE 2 TABLETS BY MOUTH EVERY MORNING AND 1 TABLET EVERY EVENING  ? ? ?Allergies:   Lipitor [atorvastatin calcium]  ? ?Social History  ? ?Socioeconomic History  ? Marital status: Married  ?  Spouse name: Not on file  ? Number of children: 2  ? Years of education: college  ? Highest education level: Not on file  ?Occupational History  ? Occupation: retired  ?  Employer: RETIRED  ?Tobacco Use  ? Smoking status: Never  ? Smokeless tobacco: Never  ?Vaping Use  ? Vaping Use: Never used  ?Substance and Sexual  Activity  ? Alcohol use: No  ? Drug use: No  ? Sexual activity: Yes  ?Other Topics Concern  ? Not on file  ?Social History Narrative  ? Does not have a living will or HPOA.  ? Desires CPR.  ? Does not want prolonged life support.  ? ?Social Determinants of Health  ? ?Financial Resource Strain: Low Risk   ? Difficulty of Paying Living Expenses: Not hard at all  ?Food Insecurity: No Food Insecurity  ? Worried About Charity fundraiser in the Last Year: Never true  ? Ran Out of Food in the Last Year: Never true  ?Transportation Needs: No Transportation Needs  ?  Lack of Transportation (Medical): No  ? Lack of Transportation (Non-Medical): No  ?Physical Activity: Sufficiently Active  ? Days of Exercise per Week: 5 days  ? Minutes of Exercise per Session: 50 min  ?Stress: No Stress Concern Present  ? Feeling of Stress : Not at all  ?Social Connections: Unknown  ? Frequency of Communication with Friends and Family: More than three times a week  ? Frequency of Social Gatherings with Friends and Family: More than three times a week  ? Attends Religious Services: Not on file  ? Active Member of Clubs or Organizations: Not on file  ? Attends Archivist Meetings: Not on file  ? Marital Status: Married  ?  ? ?Family History:  ?The patient's family history includes Alcohol abuse in her father; Anxiety disorder in her sister; Cancer in her mother; Depression in her sister; GER disease in her brother and sister; Heart attack in her sister; Hypertension in her sister. There is no history of Migraines or Breast cancer. ? ?ROS:   ?12 point review of system is negative as otherwise noted in the HPI. ? ? ?EKGs/Labs/Other Studies Reviewed:   ? ?Studies reviewed were summarized above. The additional studies were reviewed today: ? ?2D echo 11/15/2021: ?1. Left ventricular ejection fraction, by estimation, is 60 to 65%. The  ?left ventricle has normal function. The left ventricle has no regional  ?wall motion abnormalities. Left  ventricular diastolic parameters are  ?consistent with Grade I diastolic  ?dysfunction (impaired relaxation).  ? 2. Right ventricular systolic function is normal. The right ventricular  ?size is normal.  ? 3. The mit

## 2021-12-28 ENCOUNTER — Encounter: Payer: Self-pay | Admitting: Physician Assistant

## 2021-12-28 ENCOUNTER — Ambulatory Visit: Payer: Medicare PPO | Admitting: Physician Assistant

## 2021-12-28 VITALS — BP 138/60 | HR 60 | Ht 64.0 in | Wt 120.0 lb

## 2021-12-28 DIAGNOSIS — I340 Nonrheumatic mitral (valve) insufficiency: Secondary | ICD-10-CM

## 2021-12-28 DIAGNOSIS — I2510 Atherosclerotic heart disease of native coronary artery without angina pectoris: Secondary | ICD-10-CM | POA: Diagnosis not present

## 2021-12-28 DIAGNOSIS — I10 Essential (primary) hypertension: Secondary | ICD-10-CM | POA: Diagnosis not present

## 2021-12-28 DIAGNOSIS — E785 Hyperlipidemia, unspecified: Secondary | ICD-10-CM | POA: Diagnosis not present

## 2021-12-28 NOTE — Patient Instructions (Signed)

## 2022-01-11 ENCOUNTER — Encounter: Payer: Self-pay | Admitting: Family Medicine

## 2022-01-11 ENCOUNTER — Ambulatory Visit: Payer: Medicare PPO | Admitting: Family Medicine

## 2022-01-11 VITALS — BP 110/70 | HR 62 | Temp 98.4°F | Ht 64.0 in | Wt 120.8 lb

## 2022-01-11 DIAGNOSIS — J029 Acute pharyngitis, unspecified: Secondary | ICD-10-CM | POA: Diagnosis not present

## 2022-01-11 LAB — POC COVID19 BINAXNOW: SARS Coronavirus 2 Ag: NEGATIVE

## 2022-01-11 LAB — POCT RAPID STREP A (OFFICE): Rapid Strep A Screen: NEGATIVE

## 2022-01-11 NOTE — Assessment & Plan Note (Addendum)
Patient likely has viral pharyngitis.  Discussed supportive care with continued Tylenol and/or ibuprofen over-the-counter to be used for sore throat and headaches.  She had a negative rapid strep test and negative rapid COVID test.  We will send off a COVID test as well.  She will remain at home until we get that test result back.  If her symptoms are not improving over the next several days she will let us know. If she has worsening symptoms over the weekend she will be reevaluated.  ?

## 2022-01-11 NOTE — Progress Notes (Signed)
?Tommi Rumps, MD ?Phone: [phone] ? ?Katie [name] is a 76 y.o. female who presents today for same-day visit.   ? ?Sore throat: This has been going on about 5 days.  She notes her anterior neck is sore to touch as well.  She notes some headaches.  No cough or congestion.  No fevers though has felt feverish.  No known sick contacts though they did have [employer] where she works.  Tylenol helps with the headache. ? ?Social History  ? ?Tobacco Use  ?Smoking Status Never  ?Smokeless Tobacco Never  ? ? ?Current Outpatient Medications on File Prior to Visit  ?Medication Sig Dispense Refill  ? aspirin 81 MG tablet Take 81 mg by mouth daily.    ? Biotin 1 MG CAPS Take 1 mg by mouth daily.     ? CHOLECALCIFEROL PO Take by mouth daily.    ? CYANOCOBALAMIN IJ Inject as directed every 30 (thirty) days.    ? denosumab (PROLIA) 60 MG/ML SOSY injection Inject 60 mg into the skin every 6 (six) months.    ? esomeprazole (NEXIUM) 40 MG capsule Take 1 capsule (40 mg total) by mouth 2 (two) times daily before a meal. 30 minutes before food 180 capsule 3  ? ezetimibe (ZETIA) 10 MG tablet TAKE 1 TABLET(10 MG) BY MOUTH DAILY 90 tablet 3  ? Ferrous Sulfate (IRON PO) Take 1 tablet by mouth every other day. OTC    ? isosorbide mononitrate (IMDUR) 30 MG 24 hr tablet Take 0.5 tablets (15 mg total) by mouth daily. 15 tablet 3  ? Multiple Vitamin (MULTI-VITAMINS) TABS Take 1 tablet by mouth daily.     ? Multiple Vitamins-Minerals (OCUVITE PRESERVISION PO) Take by mouth 2 (two) times daily.    ? ondansetron (ZOFRAN) 4 MG tablet Take 1 tablet (4 mg total) by mouth every 8 (eight) hours as needed for nausea or vomiting. 30 tablet 11  ? propranolol (INDERAL) 40 MG tablet Take 1 tablet (40 mg total) by mouth 2 (two) times daily. 180 tablet 3  ? simvastatin (ZOCOR) 40 MG tablet TAKE 1 TABLET(40 MG) BY MOUTH DAILY AT 6 PM 90 tablet 3  ? ursodiol (ACTIGALL) 250 MG tablet TAKE 1 TABLET(250 MG) BY MOUTH THREE TIMES DAILY 270 tablet 3  ? venlafaxine  (EFFEXOR) 37.5 MG tablet TAKE 2 TABLETS BY MOUTH EVERY MORNING AND 1 TABLET EVERY EVENING 270 tablet 3  ? ?No current facility-administered medications on file prior to visit.  ? ? ? ?ROS see history of present illness ? ?Objective ? ?Physical Exam ?Vitals:  ? 01/11/22 1211  ?BP: 110/70  ?Pulse: 62  ?Temp: 98.4 ?F (36.9 ?C)  ?SpO2: 99%  ? ? ?BP Readings from Last 3 Encounters:  ?01/11/22 110/70  ?12/28/21 138/60  ?11/02/21 130/60  ? ?Wt Readings from Last 3 Encounters:  ?01/11/22 120 lb 12.8 oz (54.8 kg)  ?12/28/21 120 lb (54.4 kg)  ?12/13/21 120 lb (54.4 kg)  ? ? ?Physical Exam ?Constitutional:   ?   General: She is not in acute distress. ?   Appearance: She is not diaphoretic.  ?HENT:  ?   Mouth/Throat:  ?   Mouth: Mucous membranes are moist.  ?   Pharynx: Posterior oropharyngeal erythema present. No oropharyngeal exudate.  ?Cardiovascular:  ?   Rate and Rhythm: Normal rate and regular rhythm.  ?   Heart sounds: Normal heart sounds.  ?Pulmonary:  ?   Effort: Pulmonary effort is normal.  ?   Breath sounds: Normal breath  sounds.  ?Lymphadenopathy:  ?   Cervical: Cervical adenopathy (Slight enlargement to anterior cervical lymph nodes with some tenderness) present.  ?Skin: ?   General: Skin is warm and dry.  ?Neurological:  ?   Mental Status: She is alert.  ? ? ? ?Assessment/Plan: Please see individual problem list. ? ?Problem List Items Addressed This Visit   ? ? Sore throat - Primary  ?  Patient likely has viral pharyngitis.  Discussed supportive care with continued Tylenol and/or ibuprofen over-the-counter to be used for sore throat and headaches.  She had a negative rapid strep test and negative rapid COVID test.  We will send off a COVID test as well.  She will remain at home until we get that test result back.  If her symptoms are not improving over the next several days she will let us know. If she has worsening symptoms over the weekend she will be reevaluated.  ? ?  ?  ? Relevant Orders  ? POCT rapid strep A   ? [place] COVID-19  ? Novel Coronavirus, NAA ([facility])  ? ? ?Return if symptoms worsen or fail to improve. ? ?This visit occurred during the SARS-CoV-2 public health emergency.  Safety protocols were in place, including screening questions prior to the visit, additional usage of staff PPE, and extensive cleaning of exam room while observing appropriate contact time as indicated for disinfecting solutions.  ? ? ?Tommi Rumps, MD ?[place] ? ?

## 2022-01-11 NOTE — Patient Instructions (Signed)
Nice to see you. ?You likely have viral pharyngitis.  Treatment for this is supportive. ?You can use Tylenol or ibuprofen over-the-counter to help with any sore throat or headaches.  You can use Tylenol 1000 mg every 8 hours or ibuprofen 600 mg every 8 hours for your pain.  You should take the ibuprofen with food. ?You can also use throat lozenges as those may be helpful. ?If your symptoms are not improving please let us know. ?Please stay home until we contact you with the send out COVID test result. ?

## 2022-01-13 LAB — NOVEL CORONAVIRUS, NAA

## 2022-01-14 DIAGNOSIS — R0981 Nasal congestion: Secondary | ICD-10-CM | POA: Diagnosis not present

## 2022-01-14 DIAGNOSIS — R300 Dysuria: Secondary | ICD-10-CM | POA: Diagnosis not present

## 2022-01-14 DIAGNOSIS — R058 Other specified cough: Secondary | ICD-10-CM | POA: Diagnosis not present

## 2022-01-17 ENCOUNTER — Ambulatory Visit: Payer: Medicare PPO | Admitting: Internal Medicine

## 2022-01-17 ENCOUNTER — Encounter: Payer: Self-pay | Admitting: Internal Medicine

## 2022-01-17 VITALS — BP 114/60 | HR 69 | Temp 98.3°F | Resp 14 | Ht 64.0 in | Wt 118.0 lb

## 2022-01-17 DIAGNOSIS — E538 Deficiency of other specified B group vitamins: Secondary | ICD-10-CM

## 2022-01-17 DIAGNOSIS — I2510 Atherosclerotic heart disease of native coronary artery without angina pectoris: Secondary | ICD-10-CM

## 2022-01-17 DIAGNOSIS — R1084 Generalized abdominal pain: Secondary | ICD-10-CM | POA: Diagnosis not present

## 2022-01-17 DIAGNOSIS — E785 Hyperlipidemia, unspecified: Secondary | ICD-10-CM | POA: Diagnosis not present

## 2022-01-17 DIAGNOSIS — D509 Iron deficiency anemia, unspecified: Secondary | ICD-10-CM | POA: Diagnosis not present

## 2022-01-17 DIAGNOSIS — E559 Vitamin D deficiency, unspecified: Secondary | ICD-10-CM | POA: Diagnosis not present

## 2022-01-17 DIAGNOSIS — N2889 Other specified disorders of kidney and ureter: Secondary | ICD-10-CM

## 2022-01-17 DIAGNOSIS — K8689 Other specified diseases of pancreas: Secondary | ICD-10-CM | POA: Diagnosis not present

## 2022-01-17 DIAGNOSIS — K5900 Constipation, unspecified: Secondary | ICD-10-CM

## 2022-01-17 DIAGNOSIS — D494 Neoplasm of unspecified behavior of bladder: Secondary | ICD-10-CM

## 2022-01-17 DIAGNOSIS — Z9884 Bariatric surgery status: Secondary | ICD-10-CM

## 2022-01-17 DIAGNOSIS — E049 Nontoxic goiter, unspecified: Secondary | ICD-10-CM

## 2022-01-17 DIAGNOSIS — Z1231 Encounter for screening mammogram for malignant neoplasm of breast: Secondary | ICD-10-CM

## 2022-01-17 DIAGNOSIS — M47814 Spondylosis without myelopathy or radiculopathy, thoracic region: Secondary | ICD-10-CM | POA: Insufficient documentation

## 2022-01-17 DIAGNOSIS — I700 Atherosclerosis of aorta: Secondary | ICD-10-CM

## 2022-01-17 DIAGNOSIS — N3000 Acute cystitis without hematuria: Secondary | ICD-10-CM | POA: Diagnosis not present

## 2022-01-17 DIAGNOSIS — R5383 Other fatigue: Secondary | ICD-10-CM

## 2022-01-17 NOTE — Progress Notes (Addendum)
Chief Complaint  Patient presents with   Follow-up    Has been feeling sick since last OV. Was seen at [facility] on 5/1 by Dr.Lykins her told her she was anemic, pt has been taking iron as told. Was also seen for uti sx's was given doxy which is helping. Fasting today requesting lipid,repeat urine, B12 & Vit D check.   F/u  1. Seen 01/14/22 [facility] for f/u her throat on the the outside was swollen 01/11/22 and advised she had pharyngitis and no tx but she was worse pale which she thinks indicated anemia and had uti sxs and fever placed on doxycycline 01/11/22 urgent care and wants labs to may sure she is ok. Throat soreness on the inside resolved and swelling on the outside improved but not gone away 2. H/o bile duct surgery bypass with fatigue and overall sxs not feeling well and having been sick x 3 x w/in the last year and pt wants to make sure she is ok with h/o bile duct bypass and pancreatitic duct dilatation    Review of Systems  Constitutional:  Positive for malaise/fatigue. Negative for weight loss.  HENT:  Negative for hearing loss.   Eyes:  Negative for blurred vision.  Respiratory:  Negative for shortness of breath.   Cardiovascular:  Negative for chest pain.  Gastrointestinal:  Positive for abdominal pain. Negative for blood in stool.  Genitourinary:  Negative for dysuria.  Musculoskeletal:  Negative for falls and joint pain.  Skin:  Negative for rash.  Neurological:  Negative for headaches.  Psychiatric/Behavioral:  Negative for depression.   Past Medical History:  Diagnosis Date   Anxiety    Bile duct stricture    Breast mass, left    Choledocholithiasis    recurrent   COVID-19    09/30/20, 10/07/20    GERD (gastroesophageal reflux disease)    Hypertension    Migraine without aura, without mention of intractable migraine without mention of status migrainosus 01/03/2014   Rapid heart beat    Rotaviral gastroenteritis    12/2020   Past Surgical History:  Procedure Laterality  Date   ABDOMINAL HYSTERECTOMY     BLADDER SURGERY     x 2   BREAST EXCISIONAL BIOPSY Right 2003   benign   BREAST LUMPECTOMY     right   BREAST LUMPECTOMY WITH RADIOACTIVE SEED LOCALIZATION Left 03/31/2017   Procedure: LEFT BREAST LUMPECTOMY WITH RADIOACTIVE SEED LOCALIZATION;  Surgeon: Coralie Keens, MD;  Location: [place];  Service: General;  Laterality: Left;   CHOLECYSTECTOMY     COLONOSCOPY     Explor Lap, Hepatoduodenostomy  07/23/11   Dr Lazarus Gowda, [place]     both feet   GASTRIC BYPASS  05/04/2013   PARTIAL HYSTERECTOMY     TUBAL LIGATION     VEIN LIGATION AND STRIPPING     left leg   WRIST SURGERY     right   Family History  Problem Relation Age of Onset   Cancer Mother        lung, smoker   Alcohol abuse Father    GER disease Sister    Heart attack Sister    Depression Sister    Anxiety disorder Sister    Hypertension Sister    GER disease Brother    Migraines Neg Hx    Breast cancer Neg Hx    Social History   Socioeconomic History   Marital status: Married    Spouse name: Not  on file   Number of children: 2   Years of education: college   Highest education level: Not on file  Occupational History   Occupation: retired    Fish farm manager: RETIRED  Tobacco Use   Smoking status: Never   Smokeless tobacco: Never  Vaping Use   Vaping Use: Never used  Substance and Sexual Activity   Alcohol use: No   Drug use: No   Sexual activity: Yes  Other Topics Concern   Not on file  Social History Narrative   Does not have a living will or HPOA.   Desires CPR.   Does not want prolonged life support.   Social Determinants of Health   Financial Resource Strain: Low Risk    Difficulty of Paying Living Expenses: Not hard at all  Food Insecurity: No Food Insecurity   Worried About Charity fundraiser in the Last Year: Never true   [place] in the Last Year: Never true  Transportation Needs: No Transportation Needs   Lack of Transportation  (Medical): No   Lack of Transportation (Non-Medical): No  Physical Activity: Sufficiently Active   Days of Exercise per Week: 5 days   Minutes of Exercise per Session: 50 min  Stress: No Stress Concern Present   Feeling of Stress : Not at all  Social Connections: Unknown   Frequency of Communication with Friends and Family: More than three times a week   Frequency of Social Gatherings with Friends and Family: More than three times a week   Attends Religious Services: Not on Electrical engineer or Organizations: Not on file   Attends Archivist Meetings: Not on file   Marital Status: Married  Human resources officer Violence: Not At Risk   Fear of Current or Ex-Partner: No   Emotionally Abused: No   Physically Abused: No   Sexually Abused: No   Current Meds  Medication Sig   aspirin 81 MG tablet Take 81 mg by mouth daily.   Biotin 1 MG CAPS Take 1 mg by mouth daily.    CHOLECALCIFEROL PO Take by mouth daily.   CYANOCOBALAMIN IJ Inject as directed every 30 (thirty) days.   denosumab (PROLIA) 60 MG/ML SOSY injection Inject 60 mg into the skin every 6 (six) months.   doxycycline (VIBRA-TABS) 100 MG tablet Take 1 tablet (100 mg total) by mouth 2 (two) times daily for 10 days   esomeprazole (NEXIUM) 40 MG capsule Take 1 capsule (40 mg total) by mouth 2 (two) times daily before a meal. 30 minutes before food   ezetimibe (ZETIA) 10 MG tablet TAKE 1 TABLET(10 MG) BY MOUTH DAILY   Ferrous Sulfate (IRON PO) Take 1 tablet by mouth every other day. OTC   isosorbide mononitrate (IMDUR) 30 MG 24 hr tablet Take 0.5 tablets (15 mg total) by mouth daily.   Multiple Vitamin (MULTI-VITAMINS) TABS Take 1 tablet by mouth daily.    Multiple Vitamins-Minerals (OCUVITE PRESERVISION PO) Take by mouth 2 (two) times daily.   ondansetron (ZOFRAN) 4 MG tablet Take 1 tablet (4 mg total) by mouth every 8 (eight) hours as needed for nausea or vomiting.   propranolol (INDERAL) 40 MG tablet Take 1  tablet (40 mg total) by mouth 2 (two) times daily.   simvastatin (ZOCOR) 40 MG tablet TAKE 1 TABLET(40 MG) BY MOUTH DAILY AT 6 PM   ursodiol (ACTIGALL) 250 MG tablet TAKE 1 TABLET(250 MG) BY MOUTH THREE TIMES DAILY   venlafaxine (EFFEXOR)  37.5 MG tablet TAKE 2 TABLETS BY MOUTH EVERY MORNING AND 1 TABLET EVERY EVENING   Allergies  Allergen Reactions   Lipitor [Atorvastatin Calcium]     Joint pain   Recent Results (from the past 2160 hour(s))  Basic metabolic panel     Status: None   Collection Time: 10/23/21 11:18 AM  Result Value Ref Range   Glucose 90 70 - 99 mg/dL   BUN 16 8 - 27 mg/dL   Creatinine, Ser 0.75 0.57 - 1.00 mg/dL   eGFR 83 >59 mL/min/1.73   BUN/Creatinine Ratio 21 12 - 28   Sodium 138 134 - 144 mmol/L   Potassium 4.8 3.5 - 5.2 mmol/L   Chloride 102 96 - 106 mmol/L   CO2 25 20 - 29 mmol/L   Calcium 8.8 8.7 - 10.3 mg/dL  ECHOCARDIOGRAM COMPLETE     Status: None   Collection Time: 11/15/21  1:36 PM  Result Value Ref Range   AR max vel 2.44 cm2   AV Peak grad 6.0 mmHg   Ao pk vel 1.23 m/s   S' Lateral 2.80 cm   Area-P 1/2 3.79 cm2   AV Area VTI 2.13 cm2   AV Mean grad 3.0 mmHg   Single Plane A4C EF 60.2 %   Single Plane A2C EF 62.2 %   Calc EF 60.3 %   AV Area mean vel 2.26 cm2  Lipid panel     Status: Abnormal   Collection Time: 11/16/21  9:00 AM  Result Value Ref Range   Cholesterol, Total 186 100 - 199 mg/dL   Triglycerides 117 0 - 149 mg/dL   HDL 64 >39 mg/dL   VLDL Cholesterol Cal 21 5 - 40 mg/dL   LDL Chol Calc (NIH) 101 (H) 0 - 99 mg/dL   Chol/HDL Ratio 2.9 0.0 - 4.4 ratio    Comment:                                   T. Chol/HDL Ratio                                             Men  Women                               1/2 Avg.Risk  3.4    3.3                                   Avg.Risk  5.0    4.4                                2X Avg.Risk  9.6    7.1                                3X Avg.Risk 23.4   11.0   Novel Coronavirus, NAA ([facility])      Status: None   Collection Time: 01/11/22 12:32 PM   Specimen: Nasopharyngeal(NP) swabs in vial transport medium   Nasopharynge  Previous  Result Value Ref Range  SARS-CoV-2, NAA Comment Not Detected    Comment: We are unable to reliably determine a result for the specimen due to the presence of PCR inhibitor(s) in the specimen submitted.  If clinically indicated, please recollect an additional specimen for testing. This nucleic acid amplification test was developed and its performance characteristics determined by [employer]. Nucleic acid amplification tests include RT-PCR and TMA. This test has not been FDA cleared or approved. This test has been authorized by FDA under an Emergency Use Authorization (EUA). This test is only authorized for the duration of time the declaration that circumstances exist justifying the authorization of the emergency use of in vitro diagnostic tests for detection of SARS-CoV-2 virus and/or diagnosis of COVID-19 infection under section 564(b)(1) of the Act, 21 U.S.C. 765YYT-0(P) (1), unless the authorization is terminated or revoked sooner. When diagnostic testing is negative, the possibility of a false negative result should be considere d in the context of a patient's recent exposures and the presence of clinical signs and symptoms consistent with COVID-19. An individual without symptoms of COVID-19 and who is not shedding SARS-CoV-2 virus would expect to have a negative (not detected) result in this assay.   POCT rapid strep A     Status: None   Collection Time: 01/11/22  2:17 PM  Result Value Ref Range   Rapid Strep A Screen Negative Negative  POC COVID-19     Status: None   Collection Time: 01/11/22  2:17 PM  Result Value Ref Range   SARS Coronavirus 2 Ag Negative Negative   Objective  Body mass index is 20.25 kg/m. Wt Readings from Last 3 Encounters:  01/17/22 118 lb (53.5 kg)  01/11/22 120 lb 12.8 oz (54.8 kg)  12/28/21 120 lb  (54.4 kg)   Temp Readings from Last 3 Encounters:  01/17/22 98.3 F (36.8 C) (Oral)  01/11/22 98.4 F (36.9 C) (Oral)  07/27/21 (!) 97 F (36.1 C) (Temporal)   BP Readings from Last 3 Encounters:  01/17/22 114/60  01/11/22 110/70  12/28/21 138/60   Pulse Readings from Last 3 Encounters:  01/17/22 69  01/11/22 62  12/28/21 60    Physical Exam Vitals and nursing note reviewed.  Constitutional:      Appearance: Normal appearance. She is well-developed and well-groomed.  HENT:     Head: Normocephalic and atraumatic.  Eyes:     Conjunctiva/sclera: Conjunctivae normal.     Pupils: Pupils are equal, round, and reactive to light.  Cardiovascular:     Rate and Rhythm: Normal rate and regular rhythm.     Heart sounds: Normal heart sounds. No murmur heard. Pulmonary:     Effort: Pulmonary effort is normal.     Breath sounds: Normal breath sounds.  Abdominal:     General: Abdomen is flat. Bowel sounds are normal.     Tenderness: There is no abdominal tenderness.  Musculoskeletal:        General: No tenderness.  Skin:    General: Skin is warm and dry.  Neurological:     General: No focal deficit present.     Mental Status: She is alert and oriented to person, place, and time. Mental status is at baseline.     Cranial Nerves: Cranial nerves 2-12 are intact.     Motor: Motor function is intact.     Coordination: Coordination is intact.     Gait: Gait is intact.  Psychiatric:        Attention and Perception: Attention and perception normal.  Mood and Affect: Mood and affect normal.        Speech: Speech normal.        Behavior: Behavior normal. Behavior is cooperative.        Thought Content: Thought content normal.        Cognition and Memory: Cognition and memory normal.        Judgment: Judgment normal.    Assessment  Plan  Abnormal ct ab/pelvis FINDINGS: Lower chest: Included lung bases are clear.  Heart size is normal.   Hepatobiliary: Prior  cholecystectomy. Small amount of pneumobilia within the liver. No focal liver lesion identified.   Pancreas: Main pancreatic duct is mildly dilated. No focal lesion identified. No peripancreatic inflammatory changes.   Spleen: Normal in size without focal abnormality.   Adrenals/Urinary Tract: Unremarkable adrenal glands. Kidneys enhance symmetrically without focal lesion, stone, or hydronephrosis. Ureters are nondilated. 5 mm focus of hyperenhancement along the base of the urinary bladder posteriorly (series 6, image 93). Ureterocele noted.   Stomach/Bowel: Oral contrast is present throughout the bowel. Postsurgical changes from prior gastric bypass without evidence of obstruction or other complication. No abnormally dilated loops of bowel. Appendix is not visualized. Large volume of stool throughout the colon. No focal bowel wall thickening or inflammatory changes.   Vascular/Lymphatic: Scattered aortoiliac atherosclerotic calcifications without aneurysm. No abdominopelvic lymphadenopathy.   Reproductive: Status post hysterectomy. No adnexal masses.   Other: No free fluid. No abdominopelvic fluid collection. No pneumoperitoneum. No abdominal wall hernia.   Musculoskeletal: No acute or significant osseous findings.   IMPRESSION: 1. No acute abdominopelvic findings. 2. Postsurgical changes from prior cholecystectomy with pneumobilia. 3. Mildly dilated pancreatic duct. No evidence of an obstructive etiology by CT. Consider further evaluation with MRCP. 4. Large volume of stool throughout the colon. Correlate for constipation. 5. Nonspecific 5 mm focus of hyperenhancement along the base of the urinary bladder posteriorly, which could represent a small bladder neoplasm. Recommend further evaluation with ultrasound and/or direct visualization. 6. Ureterocele with evidence of pelvic floor laxity.   Aortic Atherosclerosis (ICD10-I70.0).     Electronically Signed   By:  Davina Poke D.O.   On: 02/08/2022 09:08  -->refer to [facility] in [place] and urology   Pancreatic duct dilated s/p bile duct bypass Ct ab/pelvis with contrast r/o bile duct issue  If there is one disc consider MRI/MRCP or ERCP and has had these procedures in the past   Iron deficiency anemia, unspecified iron deficiency anemia type - Plan: IBC + Ferritin, CBC with Differential/Platelet  Vitamin D deficiency - Plan: Vitamin D (25 hydroxy)   Hyperlipidemia, unspecified hyperlipidemia type Reviewed 11/16/21 lipid  Established with cardiology if has syncopal episodes in the future they rec cardiac cath had ct calcium score    B12 deficiency - Plan: Vitamin B12  Fatigue See above labs and imaging  Coronary artery disease involving native coronary artery of native heart without angina pectoris F/u cards  See above  Goiter - Plan: US THYROID  Generalized abdominal pain - Plan: CT Abdomen Pelvis W Contrast See above  labs H/O gastric bypass    HM Flu shot today Tdap, shingrix vaccine given prev still has Rx as of 01/17/22 Immune MMR moderna 3/3 +covid 09/2020    mammo 03/06/20 neg ordered 02/2021  left breast pain breast mri 11/28/20 normal Mammogram 05/08/21 negative ordered for 2023     Pap s/p hysterectomy for prolapse ovaries still intact pap 03/15/15 neg -of note menopause age 31 yo started cycle  age 24 y.o    colonoscopy had 04/16/13 tubular adenoma x 1 referred [facility] today Dr. Tillie Rung and f/u other GI issues s/p gastric bypass 2014 see HPI  -colonoscopy 05/20/18 negative polyps no bx f/u in 5 years    DEXA 02/15/2019 due repeat bone density discuss with Dr. Matthew Saras ob/gyn [facility] Dexa 07/23/21 week improved    HCV neg 04/20/11    F/u with dermatology yearly Dr. Rolm Bookbinder [place] due h/o NMSC nose  -dermatology 10/11 2019 negative tbse, seen 2020 f/u in 1 year upcoming due 02/2021      [facility] appt 06/17/2019 DEXA ordered last scan early osteoporosis     rec  healthy diet and exercise  Provider: Dr. Olivia Mackie McLean-Scocuzza-[facility]

## 2022-01-17 NOTE — Patient Instructions (Addendum)
Latest Reference Range & Units 11/16/21 09:00  ?Total CHOL/HDL Ratio 0.0 - 4.4 ratio 2.9  ?Cholesterol, Total 100 - 199 mg/dL 186  ?HDL Cholesterol >39 mg/dL 64  ?Triglycerides 0 - 149 mg/dL 117  ?VLDL Cholesterol Cal 5 - 40 mg/dL 21  ?LDL Chol Calc (NIH) 0 - 99 mg/dL 101 (H)  ?(H): Data is abnormally high ? ?Can take iron with stool softner colace 1-2 pills daily  ? ?

## 2022-01-18 ENCOUNTER — Encounter: Payer: Self-pay | Admitting: Internal Medicine

## 2022-01-18 DIAGNOSIS — D509 Iron deficiency anemia, unspecified: Secondary | ICD-10-CM | POA: Insufficient documentation

## 2022-01-18 LAB — URINE CULTURE
MICRO NUMBER:: 13352122
SPECIMEN QUALITY:: ADEQUATE

## 2022-01-18 LAB — IBC + FERRITIN
Ferritin: 309.3 ng/mL — ABNORMAL HIGH (ref 10.0–291.0)
Iron: 35 ug/dL — ABNORMAL LOW (ref 42–145)
Saturation Ratios: 15.7 % — ABNORMAL LOW (ref 20.0–50.0)
TIBC: 222.6 ug/dL — ABNORMAL LOW (ref 250.0–450.0)
Transferrin: 159 mg/dL — ABNORMAL LOW (ref 212.0–360.0)

## 2022-01-18 LAB — URINALYSIS, ROUTINE W REFLEX MICROSCOPIC
Bacteria, UA: NONE SEEN /HPF
Bilirubin Urine: NEGATIVE
Glucose, UA: NEGATIVE
Hgb urine dipstick: NEGATIVE
Hyaline Cast: NONE SEEN /LPF
Ketones, ur: NEGATIVE
Leukocytes,Ua: NEGATIVE
Nitrite: NEGATIVE
Specific Gravity, Urine: 1.026 (ref 1.001–1.035)
WBC, UA: NONE SEEN /HPF (ref 0–5)
pH: 6 (ref 5.0–8.0)

## 2022-01-18 LAB — CBC WITH DIFFERENTIAL/PLATELET
Basophils Absolute: 0.1 10*3/uL (ref 0.0–0.1)
Basophils Relative: 0.9 % (ref 0.0–3.0)
Eosinophils Absolute: 0.2 10*3/uL (ref 0.0–0.7)
Eosinophils Relative: 2.5 % (ref 0.0–5.0)
HCT: 31.6 % — ABNORMAL LOW (ref 36.0–46.0)
Hemoglobin: 10.4 g/dL — ABNORMAL LOW (ref 12.0–15.0)
Lymphocytes Relative: 15.2 % (ref 12.0–46.0)
Lymphs Abs: 1 10*3/uL (ref 0.7–4.0)
MCHC: 32.9 g/dL (ref 30.0–36.0)
MCV: 93.6 fl (ref 78.0–100.0)
Monocytes Absolute: 0.6 10*3/uL (ref 0.1–1.0)
Monocytes Relative: 9.8 % (ref 3.0–12.0)
Neutro Abs: 4.5 10*3/uL (ref 1.4–7.7)
Neutrophils Relative %: 71.6 % (ref 43.0–77.0)
Platelets: 259 10*3/uL (ref 150.0–400.0)
RBC: 3.38 Mil/uL — ABNORMAL LOW (ref 3.87–5.11)
RDW: 12.9 % (ref 11.5–15.5)
WBC: 6.3 10*3/uL (ref 4.0–10.5)

## 2022-01-18 LAB — COMPREHENSIVE METABOLIC PANEL
ALT: 9 U/L (ref 0–35)
AST: 10 U/L (ref 0–37)
Albumin: 3.4 g/dL — ABNORMAL LOW (ref 3.5–5.2)
Alkaline Phosphatase: 79 U/L (ref 39–117)
BUN: 15 mg/dL (ref 6–23)
CO2: 26 mEq/L (ref 19–32)
Calcium: 7.9 mg/dL — ABNORMAL LOW (ref 8.4–10.5)
Chloride: 100 mEq/L (ref 96–112)
Creatinine, Ser: 0.54 mg/dL (ref 0.40–1.20)
GFR: 90.57 mL/min (ref >60.00)
Glucose, Bld: 95 mg/dL (ref 70–99)
Potassium: 4.3 mEq/L (ref 3.5–5.1)
Sodium: 137 mEq/L (ref 135–145)
Total Bilirubin: 0.5 mg/dL (ref 0.2–1.2)
Total Protein: 6.5 g/dL (ref 6.0–8.3)

## 2022-01-18 LAB — MICROSCOPIC MESSAGE

## 2022-01-18 LAB — VITAMIN D 25 HYDROXY (VIT D DEFICIENCY, FRACTURES): VITD: 32.83 ng/mL (ref 30.00–100.00)

## 2022-01-18 LAB — VITAMIN B12: Vitamin B-12: >1504 pg/mL — ABNORMAL HIGH (ref 211–911)

## 2022-01-21 ENCOUNTER — Telehealth: Payer: Self-pay

## 2022-01-21 NOTE — Telephone Encounter (Signed)
Lvm for pt to return call in regards to labs.  ?

## 2022-01-25 ENCOUNTER — Telehealth: Payer: Self-pay

## 2022-01-25 ENCOUNTER — Ambulatory Visit: Payer: Medicare PPO | Admitting: Internal Medicine

## 2022-01-25 NOTE — Telephone Encounter (Signed)
Patient states she saw Dr. Karlyn Agee and she was supposed to have an ultrasound and CT scan but she hasn't heard from anyone.  I let her know that it looks like the process has been started and someone will be in touch with her to schedule the appointment, probably next week. ?

## 2022-02-06 ENCOUNTER — Ambulatory Visit
Admission: RE | Admit: 2022-02-06 | Discharge: 2022-02-06 | Disposition: A | Discharge status: Home / Self Care | Payer: Medicare PPO | Source: Ambulatory Visit | Attending: Internal Medicine | Admitting: Internal Medicine

## 2022-02-06 DIAGNOSIS — R1084 Generalized abdominal pain: Secondary | ICD-10-CM | POA: Insufficient documentation

## 2022-02-06 DIAGNOSIS — E049 Nontoxic goiter, unspecified: Secondary | ICD-10-CM | POA: Insufficient documentation

## 2022-02-06 DIAGNOSIS — Z9884 Bariatric surgery status: Secondary | ICD-10-CM | POA: Diagnosis not present

## 2022-02-06 DIAGNOSIS — K8689 Other specified diseases of pancreas: Secondary | ICD-10-CM | POA: Diagnosis not present

## 2022-02-06 DIAGNOSIS — E041 Nontoxic single thyroid nodule: Secondary | ICD-10-CM | POA: Diagnosis not present

## 2022-02-06 DIAGNOSIS — R109 Unspecified abdominal pain: Secondary | ICD-10-CM | POA: Diagnosis not present

## 2022-02-06 MED ORDER — IOHEXOL 300 MG/ML  SOLN
80.0000 mL | Freq: Once | INTRAMUSCULAR | Status: AC | PRN
  Administered 2022-02-06: 80 mL via INTRAVENOUS

## 2022-02-08 DIAGNOSIS — I700 Atherosclerosis of aorta: Secondary | ICD-10-CM | POA: Insufficient documentation

## 2022-02-08 DIAGNOSIS — K5900 Constipation, unspecified: Secondary | ICD-10-CM

## 2022-02-08 DIAGNOSIS — N2889 Other specified disorders of kidney and ureter: Secondary | ICD-10-CM | POA: Insufficient documentation

## 2022-02-08 DIAGNOSIS — K8689 Other specified diseases of pancreas: Secondary | ICD-10-CM | POA: Insufficient documentation

## 2022-02-08 DIAGNOSIS — D494 Neoplasm of unspecified behavior of bladder: Secondary | ICD-10-CM | POA: Insufficient documentation

## 2022-02-08 HISTORY — DX: Constipation, unspecified: K59.00

## 2022-02-08 NOTE — Addendum Note (Signed)
Addended by: Orland Mustard on: 02/08/2022 02:06 PM   Modules accepted: Orders

## 2022-02-08 NOTE — Addendum Note (Signed)
Addended by: Orland Mustard on: 02/08/2022 04:38 PM   Modules accepted: Orders

## 2022-02-15 ENCOUNTER — Telehealth (INDEPENDENT_AMBULATORY_CARE_PROVIDER_SITE_OTHER): Payer: Medicare PPO | Admitting: Internal Medicine

## 2022-02-15 ENCOUNTER — Encounter: Payer: Self-pay | Admitting: Internal Medicine

## 2022-02-15 DIAGNOSIS — J0191 Acute recurrent sinusitis, unspecified: Secondary | ICD-10-CM | POA: Insufficient documentation

## 2022-02-15 DIAGNOSIS — J0101 Acute recurrent maxillary sinusitis: Secondary | ICD-10-CM

## 2022-02-15 HISTORY — DX: Acute recurrent sinusitis, unspecified: J01.91

## 2022-02-15 MED ORDER — AZITHROMYCIN 500 MG PO TABS: 500.0000 mg | ORAL_TABLET | Freq: Every day | ORAL | 0 refills | Status: DC

## 2022-02-15 MED ORDER — PREDNISONE 10 MG PO TABS: ORAL_TABLET | ORAL | 0 refills | Status: DC

## 2022-02-15 NOTE — Assessment & Plan Note (Addendum)
Treated with doxycycline May 1.  symptoms resolved but returned 3 weeks later.  No allergy symptoms. Azithromycin and prednisone prescribed.  Daily use of a probiotic advised for 3 weeks.

## 2022-02-15 NOTE — Progress Notes (Signed)
Virtual Visit converted to Telephone  Note  This visit type was conducted due to national recommendations for restrictions regarding the COVID-19 pandemic (e.g. social distancing).  This format is felt to be most appropriate for this patient at this time.  All issues noted in this document were discussed and addressed.  No physical exam was performed (except for noted visual exam findings with Video Visits).   I attempted to connect  [email or web address]@ on 02/15/22 at  4:30 PM EDT by a video enabled telemedicine application and verified that I am speaking with the correct person using two identifiers. Location patient: home Location provider: work or home office Persons participating in the virtual visit: patient, provider  I discussed the limitations, risks, security and privacy concerns of performing an evaluation and management service by telephone and the availability of in person appointments. I also discussed with the patient that there may be a patient responsible charge related to this service. The patient expressed understanding and agreed to proceed.  Interactive audio and video telecommunications were attempted between this provider and patient, however failed, due to patient having technical difficulties OR patient did not have access to video capability.  We continued and completed visit with audio only.   Reason for visit: sinusitis  HPI:   76 yr old female presents with  profound fatigue,  facial pain,  sinus congestion  and productive cough for the past week.    No sneezing .  started as a viral URi ,  took OTC decongestants,  but congestion has progressed.  Feeling profoundly fatigued  Treated 3 weeks ago with doxycycline for same symptoms   symptoms cleared up completely. But returned .     ROS: See pertinent positives and negatives per HPI.  Past Medical History:  Diagnosis Date   Anxiety    Bile duct stricture    Breast mass, left    Choledocholithiasis    recurrent    COVID-19    09/30/20, 10/07/20    GERD (gastroesophageal reflux disease)    Hypertension    Migraine without aura, without mention of intractable migraine without mention of status migrainosus 01/03/2014   Rapid heart beat    Rotaviral gastroenteritis    12/2020    Past Surgical History:  Procedure Laterality Date   ABDOMINAL HYSTERECTOMY     BLADDER SURGERY     x 2   BREAST EXCISIONAL BIOPSY Right 2003   benign   BREAST LUMPECTOMY     right   BREAST LUMPECTOMY WITH RADIOACTIVE SEED LOCALIZATION Left 03/31/2017   Procedure: LEFT BREAST LUMPECTOMY WITH RADIOACTIVE SEED LOCALIZATION;  Surgeon: Coralie Keens, MD;  Location: [facility];  Service: General;  Laterality: Left;   CHOLECYSTECTOMY     COLONOSCOPY     Explor Lap, Hepatoduodenostomy  07/23/11   Dr Lazarus Gowda, [place]     both feet   GASTRIC BYPASS  05/04/2013   PARTIAL HYSTERECTOMY     TUBAL LIGATION     VEIN LIGATION AND STRIPPING     left leg   WRIST SURGERY     right    Family History  Problem Relation Age of Onset   Cancer Mother        lung, smoker   Alcohol abuse Father    GER disease Sister    Heart attack Sister    Depression Sister    Anxiety disorder Sister    Hypertension Sister    GER disease Brother    Migraines Neg Hx  Breast cancer Neg Hx     SOCIAL HX:  reports that she has never smoked. She has never used smokeless tobacco. She reports that she does not drink alcohol and does not use drugs.    Current Outpatient Medications:    aspirin 81 MG tablet, Take 81 mg by mouth daily., Disp: , Rfl:    azithromycin (ZITHROMAX) 500 MG tablet, Take 1 tablet (500 mg total) by mouth daily., Disp: 7 tablet, Rfl: 0   Biotin 1 MG CAPS, Take 1 mg by mouth daily. , Disp: , Rfl:    CHOLECALCIFEROL PO, Take by mouth daily., Disp: , Rfl:    CYANOCOBALAMIN IJ, Inject as directed every 30 (thirty) days., Disp: , Rfl:    denosumab (PROLIA) 60 MG/ML SOSY injection, Inject 60 mg into the skin every 6 (six)  months., Disp: , Rfl:    esomeprazole (NEXIUM) 40 MG capsule, Take 1 capsule (40 mg total) by mouth 2 (two) times daily before a meal. 30 minutes before food, Disp: 180 capsule, Rfl: 3   ezetimibe (ZETIA) 10 MG tablet, TAKE 1 TABLET(10 MG) BY MOUTH DAILY, Disp: 90 tablet, Rfl: 3   Ferrous Sulfate (IRON PO), Take 1 tablet by mouth every other day. OTC, Disp: , Rfl:    Multiple Vitamin (MULTI-VITAMINS) TABS, Take 1 tablet by mouth daily. , Disp: , Rfl:    Multiple Vitamins-Minerals (OCUVITE PRESERVISION PO), Take by mouth 2 (two) times daily., Disp: , Rfl:    ondansetron (ZOFRAN) 4 MG tablet, Take 1 tablet (4 mg total) by mouth every 8 (eight) hours as needed for nausea or vomiting., Disp: 30 tablet, Rfl: 11   predniSONE (DELTASONE) 10 MG tablet, 6 tablets on Day 1 , then reduce by 1 tablet daily until gone, Disp: 21 tablet, Rfl: 0   propranolol (INDERAL) 40 MG tablet, Take 1 tablet (40 mg total) by mouth 2 (two) times daily., Disp: 180 tablet, Rfl: 3   simvastatin (ZOCOR) 40 MG tablet, TAKE 1 TABLET(40 MG) BY MOUTH DAILY AT 6 PM, Disp: 90 tablet, Rfl: 3   ursodiol (ACTIGALL) 250 MG tablet, TAKE 1 TABLET(250 MG) BY MOUTH THREE TIMES DAILY, Disp: 270 tablet, Rfl: 3   venlafaxine (EFFEXOR) 37.5 MG tablet, TAKE 2 TABLETS BY MOUTH EVERY MORNING AND 1 TABLET EVERY EVENING, Disp: 270 tablet, Rfl: 3   isosorbide mononitrate (IMDUR) 30 MG 24 hr tablet, Take 0.5 tablets (15 mg total) by mouth daily., Disp: 15 tablet, Rfl: 3  EXAM:   General impression: alert, cooperative and articulate.  No signs of being in distress  Lungs: speech is fluent sentence length suggests that patient is not short of breath and not punctuated by cough, sneezing or sniffing. Marland Kitchen   Psych: affect normal.  speech is articulate and non pressured .  Denies suicidal thoughts   ASSESSMENT AND PLAN:  Discussed the following assessment and plan:  Acute recurrent maxillary sinusitis  Acute recurrent sinusitis Treated with doxycycline  May 1.  symptoms resolved but returned 3 weeks later.  No allergy symptoms. Azithromycin and prednisone prescribed.  Daily use of a probiotic advised for 3 weeks.     I discussed the assessment and treatment plan with the patient. The patient was provided an opportunity to ask questions and all were answered. The patient agreed with the plan and demonstrated an understanding of the instructions.   The patient was advised to call back or seek an in-person evaluation if the symptoms worsen or if the condition fails to improve as anticipated.  I spent 20 minutes dedicated to the care of this patient on the date of this encounter to include pre-visit review of her medical history,  non  Face-to-face time with the patient , and post visit ordering of testing and therapeutics.    Crecencio Mc, MD

## 2022-02-26 ENCOUNTER — Encounter: Payer: Self-pay | Admitting: Urology

## 2022-02-26 ENCOUNTER — Other Ambulatory Visit: Payer: Self-pay

## 2022-02-26 ENCOUNTER — Ambulatory Visit (INDEPENDENT_AMBULATORY_CARE_PROVIDER_SITE_OTHER): Payer: Medicare PPO | Admitting: Urology

## 2022-02-26 ENCOUNTER — Other Ambulatory Visit: Payer: Self-pay | Admitting: *Deleted

## 2022-02-26 VITALS — BP 131/65 | HR 63 | Ht 64.0 in | Wt 121.0 lb

## 2022-02-26 DIAGNOSIS — N3289 Other specified disorders of bladder: Secondary | ICD-10-CM | POA: Diagnosis not present

## 2022-02-26 DIAGNOSIS — M810 Age-related osteoporosis without current pathological fracture: Secondary | ICD-10-CM | POA: Diagnosis not present

## 2022-02-26 DIAGNOSIS — N810 Urethrocele: Secondary | ICD-10-CM

## 2022-02-26 DIAGNOSIS — N329 Bladder disorder, unspecified: Secondary | ICD-10-CM

## 2022-02-26 NOTE — Patient Instructions (Signed)

## 2022-02-26 NOTE — Progress Notes (Signed)
02/26/22 3:50 PM   Katie [name] 02-06-1946 [id number]  CC: Possible bladder lesion  HPI: 76 year old female referred for a possible bladder lesion seen on CT.  She is a 76 year old female with reported history of " bladder tack" surgery x2, including with mesh with Dr. Eliberto Ivory.  This was done many years ago, and she does not remember the dates.  She denies any significant urinary symptoms at this time aside from some mild urgency.  She recently had a CT abdomen and pelvis performed on 02/06/2022 for some lower abdominal pain that showed a nonspecific 5 mm focus of hyperenhancement along the base of the urinary bladder posteriorly.  Radiology also comments on a ureterocele, though in my review of the CT this is a typo and should read cystocele.  She denies any smoking history, no history of gross hematuria.  Urinalysis on 01/17/2022 benign, UA today pending.   PMH: Past Medical History:  Diagnosis Date   Anxiety    Bile duct stricture    Breast mass, left    Choledocholithiasis    recurrent   COVID-19    09/30/20, 10/07/20    GERD (gastroesophageal reflux disease)    Hypertension    Migraine without aura, without mention of intractable migraine without mention of status migrainosus 01/03/2014   Rapid heart beat    Rotaviral gastroenteritis    12/2020    Surgical History: Past Surgical History:  Procedure Laterality Date   ABDOMINAL HYSTERECTOMY     BLADDER SURGERY     x 2   BREAST EXCISIONAL BIOPSY Right 2003   benign   BREAST LUMPECTOMY     right   BREAST LUMPECTOMY WITH RADIOACTIVE SEED LOCALIZATION Left 03/31/2017   Procedure: LEFT BREAST LUMPECTOMY WITH RADIOACTIVE SEED LOCALIZATION;  Surgeon: Coralie Keens, MD;  Location: [facility];  Service: General;  Laterality: Left;   CHOLECYSTECTOMY     COLONOSCOPY     Explor Lap, Hepatoduodenostomy  07/23/11   Dr Lazarus Gowda, [place]     both feet   GASTRIC BYPASS  05/04/2013   PARTIAL HYSTERECTOMY     TUBAL LIGATION     VEIN  LIGATION AND STRIPPING     left leg   WRIST SURGERY     right    Family History: Family History  Problem Relation Age of Onset   Cancer Mother        lung, smoker   Alcohol abuse Father    GER disease Sister    Heart attack Sister    Depression Sister    Anxiety disorder Sister    Hypertension Sister    GER disease Brother    Migraines Neg Hx    Breast cancer Neg Hx     Social History:  reports that she has never smoked. She has never been exposed to tobacco smoke. She has never used smokeless tobacco. She reports that she does not drink alcohol and does not use drugs.  Physical Exam: BP 131/65   Pulse 63   Ht '5\' 4"'$  (1.626 m)   Wt 121 lb (54.9 kg)   BMI 20.77 kg/m    Constitutional:  Alert and oriented, No acute distress. Cardiovascular: No clubbing, cyanosis, or edema. Respiratory: Normal respiratory effort, no increased work of breathing. GI: Abdomen is soft, nontender, nondistended, no abdominal masses  Laboratory Data: Reviewed  Pertinent Imaging: I have personally viewed and interpreted the CT abdomen pelvis showing a nonspecific 5 mm focus of hyperenhancement along the base of  the urinary bladder posteriorly.  Radiology also comments on a ureterocele, though in my review of the CT this is a typo and should read cystocele.  Assessment & Plan:   76 year old female with a small 5 mm focus of hyperenhancement along the base of the urinary bladder posteriorly on CT of unclear etiology.  Possible etiologies could include scar tissue, mesh, or sequela of prior bladder incontinence surgeries, or potentially malignancy.  I recommended cystoscopy for further evaluation and she is in agreement.  I do not see any evidence of a ureterocele as mentioned by radiology, and I think this is a typo and should read cystocele.  Cystoscopy for further evaluation of possible bladder lesion  Nickolas Madrid, MD 02/26/2022  [facility] [address], [place], [place] [address] [phone]

## 2022-02-27 LAB — URINALYSIS, COMPLETE
Bilirubin, UA: NEGATIVE
Glucose, UA: NEGATIVE
Ketones, UA: NEGATIVE
Leukocytes,UA: NEGATIVE
Nitrite, UA: NEGATIVE
Protein,UA: NEGATIVE
RBC, UA: NEGATIVE
Specific Gravity, UA: 1.013 (ref 1.005–1.030)
Urobilinogen, Ur: 0.2 mg/dL (ref 0.2–1.0)
pH, UA: 7 (ref 5.0–7.5)

## 2022-02-27 LAB — MICROSCOPIC EXAMINATION
Bacteria, UA: NONE SEEN
Casts: NONE SEEN /lpf
RBC, Urine: NONE SEEN /hpf (ref 0–2)
WBC, UA: NONE SEEN /hpf (ref 0–5)

## 2022-03-08 DIAGNOSIS — M810 Age-related osteoporosis without current pathological fracture: Secondary | ICD-10-CM | POA: Diagnosis not present

## 2022-03-11 ENCOUNTER — Other Ambulatory Visit: Payer: Self-pay | Admitting: *Deleted

## 2022-03-11 DIAGNOSIS — R208 Other disturbances of skin sensation: Secondary | ICD-10-CM | POA: Diagnosis not present

## 2022-03-11 DIAGNOSIS — B078 Other viral warts: Secondary | ICD-10-CM | POA: Diagnosis not present

## 2022-03-11 DIAGNOSIS — I872 Venous insufficiency (chronic) (peripheral): Secondary | ICD-10-CM | POA: Diagnosis not present

## 2022-03-11 DIAGNOSIS — L821 Other seborrheic keratosis: Secondary | ICD-10-CM | POA: Diagnosis not present

## 2022-03-11 DIAGNOSIS — L814 Other melanin hyperpigmentation: Secondary | ICD-10-CM | POA: Diagnosis not present

## 2022-03-11 DIAGNOSIS — L819 Disorder of pigmentation, unspecified: Secondary | ICD-10-CM | POA: Diagnosis not present

## 2022-03-11 DIAGNOSIS — I8311 Varicose veins of right lower extremity with inflammation: Secondary | ICD-10-CM | POA: Diagnosis not present

## 2022-03-11 DIAGNOSIS — Z85828 Personal history of other malignant neoplasm of skin: Secondary | ICD-10-CM | POA: Diagnosis not present

## 2022-03-11 DIAGNOSIS — I8312 Varicose veins of left lower extremity with inflammation: Secondary | ICD-10-CM | POA: Diagnosis not present

## 2022-03-11 DIAGNOSIS — L820 Inflamed seborrheic keratosis: Secondary | ICD-10-CM | POA: Diagnosis not present

## 2022-03-11 MED ORDER — ISOSORBIDE MONONITRATE ER 30 MG PO TB24: 15.0000 mg | ORAL_TABLET | Freq: Every day | ORAL | 2 refills | Status: DC

## 2022-03-18 ENCOUNTER — Ambulatory Visit: Payer: Medicare PPO | Admitting: Urology

## 2022-03-18 ENCOUNTER — Encounter: Payer: Self-pay | Admitting: Urology

## 2022-03-18 VITALS — BP 136/66 | HR 58 | Ht 64.0 in | Wt 120.0 lb

## 2022-03-18 DIAGNOSIS — N329 Bladder disorder, unspecified: Secondary | ICD-10-CM | POA: Diagnosis not present

## 2022-03-18 LAB — URINALYSIS, COMPLETE
Bilirubin, UA: NEGATIVE
Glucose, UA: NEGATIVE
Ketones, UA: NEGATIVE
Leukocytes,UA: NEGATIVE
Nitrite, UA: NEGATIVE
Protein,UA: NEGATIVE
RBC, UA: NEGATIVE
Specific Gravity, UA: 1.015 (ref 1.005–1.030)
Urobilinogen, Ur: 0.2 mg/dL (ref 0.2–1.0)
pH, UA: 7 (ref 5.0–7.5)

## 2022-03-18 LAB — MICROSCOPIC EXAMINATION: Bacteria, UA: NONE SEEN

## 2022-03-18 NOTE — Progress Notes (Signed)
Cystoscopy Procedure Note:  Indication: Possible bladder lesion on CT  After informed consent and discussion of the procedure and its risks, Katie [name] was positioned and prepped in the standard fashion. Cystoscopy was performed with a flexible cystoscope. The urethra, bladder neck and entire bladder was visualized in a standard fashion. The ureteral orifices were visualized in their normal location and orientation.  Bladder mucosa grossly normal throughout, no abnormalities on retroflexion  Findings: Normal cystoscopy  Assessment and Plan: Follow-up with urology as needed  Nickolas Madrid, MD 03/18/2022

## 2022-03-29 ENCOUNTER — Ambulatory Visit: Payer: Medicare PPO | Admitting: Internal Medicine

## 2022-03-29 ENCOUNTER — Encounter: Payer: Self-pay | Admitting: Internal Medicine

## 2022-03-29 VITALS — BP 130/70 | HR 54 | Temp 98.1°F | Ht 64.0 in | Wt 121.0 lb

## 2022-03-29 DIAGNOSIS — E441 Mild protein-calorie malnutrition: Secondary | ICD-10-CM | POA: Diagnosis not present

## 2022-03-29 DIAGNOSIS — E611 Iron deficiency: Secondary | ICD-10-CM

## 2022-03-29 DIAGNOSIS — D509 Iron deficiency anemia, unspecified: Secondary | ICD-10-CM | POA: Diagnosis not present

## 2022-03-29 LAB — IBC + FERRITIN
Ferritin: 37.3 ng/mL (ref 10.0–291.0)
Iron: 78 ug/dL (ref 42–145)
Saturation Ratios: 19.3 % — ABNORMAL LOW (ref 20.0–50.0)
TIBC: 403.2 ug/dL (ref 250.0–450.0)
Transferrin: 288 mg/dL (ref 212.0–360.0)

## 2022-03-29 LAB — COMPREHENSIVE METABOLIC PANEL
ALT: 12 U/L (ref 0–35)
AST: 14 U/L (ref 0–37)
Albumin: 4.4 g/dL (ref 3.5–5.2)
Alkaline Phosphatase: 59 U/L (ref 39–117)
BUN: 13 mg/dL (ref 6–23)
CO2: 28 mEq/L (ref 19–32)
Calcium: 9 mg/dL (ref 8.4–10.5)
Chloride: 102 mEq/L (ref 96–112)
Creatinine, Ser: 0.69 mg/dL (ref 0.40–1.20)
GFR: 85.26 mL/min (ref >60.00)
Glucose, Bld: 84 mg/dL (ref 70–99)
Potassium: 4.6 mEq/L (ref 3.5–5.1)
Sodium: 136 mEq/L (ref 135–145)
Total Bilirubin: 0.5 mg/dL (ref 0.2–1.2)
Total Protein: 6.6 g/dL (ref 6.0–8.3)

## 2022-03-29 LAB — CBC WITH DIFFERENTIAL/PLATELET
Basophils Absolute: 0 10*3/uL (ref 0.0–0.1)
Basophils Relative: 0.5 % (ref 0.0–3.0)
Eosinophils Absolute: 0.2 10*3/uL (ref 0.0–0.7)
Eosinophils Relative: 3.8 % (ref 0.0–5.0)
HCT: 36.1 % (ref 36.0–46.0)
Hemoglobin: 12.1 g/dL (ref 12.0–15.0)
Lymphocytes Relative: 22.3 % (ref 12.0–46.0)
Lymphs Abs: 1 10*3/uL (ref 0.7–4.0)
MCHC: 33.6 g/dL (ref 30.0–36.0)
MCV: 91.9 fl (ref 78.0–100.0)
Monocytes Absolute: 0.4 10*3/uL (ref 0.1–1.0)
Monocytes Relative: 7.8 % (ref 3.0–12.0)
Neutro Abs: 3.1 10*3/uL (ref 1.4–7.7)
Neutrophils Relative %: 65.6 % (ref 43.0–77.0)
Platelets: 228 10*3/uL (ref 150.0–400.0)
RBC: 3.93 Mil/uL (ref 3.87–5.11)
RDW: 14 % (ref 11.5–15.5)
WBC: 4.7 10*3/uL (ref 4.0–10.5)

## 2022-03-29 NOTE — Progress Notes (Signed)
Chief Complaint  Patient presents with   Follow-up    Pt wants her iron, protein, and calcium rechecked   F/u  1. Doing well abnormal ct cystoscopy 03/18/22 urology neg f/u prn  2. Iron def, low calcium and low protein will check labs today energy better  3. Rec Tdap and shingrix     Review of Systems  Constitutional:  Negative for weight loss.  HENT:  Negative for hearing loss.   Eyes:  Negative for blurred vision.  Respiratory:  Negative for shortness of breath.   Cardiovascular:  Negative for chest pain.  Gastrointestinal:  Negative for abdominal pain and blood in stool.  Genitourinary:  Negative for dysuria.  Musculoskeletal:  Negative for falls and joint pain.  Skin:  Negative for rash.  Neurological:  Negative for headaches.  Psychiatric/Behavioral:  Negative for depression.    Past Medical History:  Diagnosis Date   Anxiety    Bile duct stricture    Breast mass, left    Choledocholithiasis    recurrent   COVID-19    09/30/20, 10/07/20    GERD (gastroesophageal reflux disease)    Hypertension    Migraine without aura, without mention of intractable migraine without mention of status migrainosus 01/03/2014   Rapid heart beat    Rotaviral gastroenteritis    12/2020   Past Surgical History:  Procedure Laterality Date   ABDOMINAL HYSTERECTOMY     BLADDER SURGERY     x 2   BREAST EXCISIONAL BIOPSY Right 2003   benign   BREAST LUMPECTOMY     right   BREAST LUMPECTOMY WITH RADIOACTIVE SEED LOCALIZATION Left 03/31/2017   Procedure: LEFT BREAST LUMPECTOMY WITH RADIOACTIVE SEED LOCALIZATION;  Surgeon: Coralie Keens, MD;  Location: [facility];  Service: General;  Laterality: Left;   CHOLECYSTECTOMY     COLONOSCOPY     CYSTOSCOPY     normal 03/18/22   Explor Lap, Hepatoduodenostomy  07/23/2011   Dr Lazarus Gowda, [place]     both feet   GASTRIC BYPASS  05/04/2013   PARTIAL HYSTERECTOMY     SKIN BIOPSY     skin bx RUB benign   TUBAL LIGATION     VEIN LIGATION AND  STRIPPING     left leg   WRIST SURGERY     right   Family History  Problem Relation Age of Onset   Cancer Mother        lung, smoker   Alcohol abuse Father    GER disease Sister    Heart attack Sister    Depression Sister    Anxiety disorder Sister    Hypertension Sister    GER disease Brother    Migraines Neg Hx    Breast cancer Neg Hx    Social History   Socioeconomic History   Marital status: Married    Spouse name: Not on file   Number of children: 2   Years of education: college   Highest education level: Not on file  Occupational History   Occupation: retired    Fish farm manager: RETIRED  Tobacco Use   Smoking status: Never    Passive exposure: Never   Smokeless tobacco: Never  Vaping Use   Vaping Use: Never used  Substance and Sexual Activity   Alcohol use: No   Drug use: No   Sexual activity: Yes  Other Topics Concern   Not on file  Social History Narrative   Does not have a living will or HPOA.  Desires CPR.   Does not want prolonged life support.   Social Determinants of Health   Financial Resource Strain: Low Risk  (12/13/2021)   Overall Financial Resource Strain (CARDIA)    Difficulty of Paying Living Expenses: Not hard at all  Food Insecurity: No Food Insecurity (12/13/2021)   Hunger Vital Sign    Worried About Running Out of Food in the Last Year: Never true    Ran Out of Food in the Last Year: Never true  Transportation Needs: No Transportation Needs (12/13/2021)   PRAPARE - Hydrologist (Medical): No    Lack of Transportation (Non-Medical): No  Physical Activity: Sufficiently Active (12/13/2021)   Exercise Vital Sign    Days of Exercise per Week: 5 days    Minutes of Exercise per Session: 50 min  Stress: No Stress Concern Present (12/13/2021)   [place]    Feeling of Stress : Not at all  Social Connections: Unknown (12/13/2021)   Social Connection and  Isolation Panel [NHANES]    Frequency of Communication with Friends and Family: More than three times a week    Frequency of Social Gatherings with Friends and Family: More than three times a week    Attends Religious Services: Not on file    Active Member of Clubs or Organizations: Not on file    Attends Archivist Meetings: Not on file    Marital Status: Married  Intimate Partner Violence: Not At Risk (12/13/2021)   Humiliation, Afraid, Rape, and Kick questionnaire    Fear of Current or Ex-Partner: No    Emotionally Abused: No    Physically Abused: No    Sexually Abused: No   Current Meds  Medication Sig   aspirin 81 MG tablet Take 81 mg by mouth daily.   Biotin 1 MG CAPS Take 1 mg by mouth daily.    CHOLECALCIFEROL PO Take by mouth daily.   CYANOCOBALAMIN IJ Inject as directed every 30 (thirty) days.   denosumab (PROLIA) 60 MG/ML SOSY injection Inject 60 mg into the skin every 6 (six) months.   esomeprazole (NEXIUM) 40 MG capsule Take 1 capsule (40 mg total) by mouth 2 (two) times daily before a meal. 30 minutes before food   ezetimibe (ZETIA) 10 MG tablet TAKE 1 TABLET(10 MG) BY MOUTH DAILY   Ferrous Sulfate (IRON PO) Take 1 tablet by mouth every other day. OTC   isosorbide mononitrate (IMDUR) 30 MG 24 hr tablet Take 0.5 tablets (15 mg total) by mouth daily.   Multiple Vitamin (MULTI-VITAMINS) TABS Take 1 tablet by mouth daily.    ondansetron (ZOFRAN) 4 MG tablet Take 1 tablet (4 mg total) by mouth every 8 (eight) hours as needed for nausea or vomiting.   propranolol (INDERAL) 40 MG tablet Take 1 tablet (40 mg total) by mouth 2 (two) times daily.   simvastatin (ZOCOR) 40 MG tablet TAKE 1 TABLET(40 MG) BY MOUTH DAILY AT 6 PM   ursodiol (ACTIGALL) 250 MG tablet TAKE 1 TABLET(250 MG) BY MOUTH THREE TIMES DAILY   venlafaxine (EFFEXOR) 37.5 MG tablet TAKE 2 TABLETS BY MOUTH EVERY MORNING AND 1 TABLET EVERY EVENING   Allergies  Allergen Reactions   Lipitor [Atorvastatin  Calcium]     Joint pain   Recent Results (from the past 2160 hour(s))  Novel Coronavirus, NAA ([facility])     Status: None   Collection Time: 01/11/22 12:32 PM   Specimen: Nasopharyngeal(NP)  swabs in vial transport medium   Nasopharynge  Previous  Result Value Ref Range   SARS-CoV-2, NAA Comment Not Detected    Comment: We are unable to reliably determine a result for the specimen due to the presence of PCR inhibitor(s) in the specimen submitted.  If clinically indicated, please recollect an additional specimen for testing. This nucleic acid amplification test was developed and its performance characteristics determined by [employer]. Nucleic acid amplification tests include RT-PCR and TMA. This test has not been FDA cleared or approved. This test has been authorized by FDA under an Emergency Use Authorization (EUA). This test is only authorized for the duration of time the declaration that circumstances exist justifying the authorization of the emergency use of in vitro diagnostic tests for detection of SARS-CoV-2 virus and/or diagnosis of COVID-19 infection under section 564(b)(1) of the Act, 21 U.S.C. 132GMW-1(U) (1), unless the authorization is terminated or revoked sooner. When diagnostic testing is negative, the possibility of a false negative result should be considere d in the context of a patient's recent exposures and the presence of clinical signs and symptoms consistent with COVID-19. An individual without symptoms of COVID-19 and who is not shedding SARS-CoV-2 virus would expect to have a negative (not detected) result in this assay.   POCT rapid strep A     Status: None   Collection Time: 01/11/22  2:17 PM  Result Value Ref Range   Rapid Strep A Screen Negative Negative  POC COVID-19     Status: None   Collection Time: 01/11/22  2:17 PM  Result Value Ref Range   SARS Coronavirus 2 Ag Negative Negative  Urinalysis, Routine w reflex microscopic     Status:  Abnormal   Collection Time: 01/17/22  2:37 PM  Result Value Ref Range   Color, Urine DARK YELLOW YELLOW   APPearance CLEAR CLEAR   Specific Gravity, Urine 1.026 1.001 - 1.035   pH 6.0 5.0 - 8.0   Glucose, UA NEGATIVE NEGATIVE   Bilirubin Urine NEGATIVE NEGATIVE   Ketones, ur NEGATIVE NEGATIVE   Hgb urine dipstick NEGATIVE NEGATIVE   Protein, ur 1+ (A) NEGATIVE   Nitrite NEGATIVE NEGATIVE   Leukocytes,Ua NEGATIVE NEGATIVE   WBC, UA NONE SEEN 0 - 5 /HPF   RBC / HPF 0-2 0 - 2 /HPF   Squamous Epithelial / LPF 0-5 < OR = 5 /HPF   Bacteria, UA NONE SEEN NONE SEEN /HPF   Calcium Oxalate Crystal FEW NONE OR FEW /HPF   Hyaline Cast NONE SEEN NONE SEEN /LPF  Urine Culture     Status: None   Collection Time: 01/17/22  2:37 PM   Specimen: Urine  Result Value Ref Range   MICRO NUMBER: [id number]    SPECIMEN QUALITY: Adequate    Sample Source NOT GIVEN    STATUS: FINAL    Result:      Less than 10,000 CFU/mL of single Gram positive organism isolated. No further testing will be performed. If clinically indicated, recollection using a method to minimize contamination, with prompt transfer to Urine Culture Transport Tube, is recommended.  MICROSCOPIC MESSAGE     Status: None   Collection Time: 01/17/22  2:37 PM  Result Value Ref Range   Note      Comment: This urine was analyzed for the presence of WBC,  RBC, bacteria, casts, and other formed elements.  Only those elements seen were reported. . .   IBC + Ferritin     Status: Abnormal  Collection Time: 01/17/22  2:38 PM  Result Value Ref Range   Iron 35 (L) 42 - 145 ug/dL   Transferrin 159.0 (L) 212.0 - 360.0 mg/dL   Saturation Ratios 15.7 (L) 20.0 - 50.0 %   Ferritin 309.3 (H) 10.0 - 291.0 ng/mL   TIBC 222.6 (L) 250.0 - 450.0 mcg/dL  Vitamin D (25 hydroxy)     Status: None   Collection Time: 01/17/22  2:38 PM  Result Value Ref Range   VITD 32.83 30.00 - 100.00 ng/mL  Vitamin B12     Status: Abnormal   Collection Time: 01/17/22   2:38 PM  Result Value Ref Range   Vitamin B-12 >1504 (H) 211 - 911 pg/mL  CBC with Differential/Platelet     Status: Abnormal   Collection Time: 01/17/22  2:38 PM  Result Value Ref Range   WBC 6.3 4.0 - 10.5 K/uL   RBC 3.38 (L) 3.87 - 5.11 Mil/uL   Hemoglobin 10.4 (L) 12.0 - 15.0 g/dL   HCT 31.6 (L) 36.0 - 46.0 %   MCV 93.6 78.0 - 100.0 fl   MCHC 32.9 30.0 - 36.0 g/dL   RDW 12.9 11.5 - 15.5 %   Platelets 259.0 Repeated and verified X2. 150.0 - 400.0 K/uL   Neutrophils Relative % 71.6 43.0 - 77.0 %   Lymphocytes Relative 15.2 12.0 - 46.0 %   Monocytes Relative 9.8 3.0 - 12.0 %   Eosinophils Relative 2.5 0.0 - 5.0 %   Basophils Relative 0.9 0.0 - 3.0 %   Neutro Abs 4.5 1.4 - 7.7 K/uL   Lymphs Abs 1.0 0.7 - 4.0 K/uL   Monocytes Absolute 0.6 0.1 - 1.0 K/uL   Eosinophils Absolute 0.2 0.0 - 0.7 K/uL   Basophils Absolute 0.1 0.0 - 0.1 K/uL  Comprehensive metabolic panel     Status: Abnormal   Collection Time: 01/17/22  2:38 PM  Result Value Ref Range   Sodium 137 135 - 145 mEq/L   Potassium 4.3 3.5 - 5.1 mEq/L   Chloride 100 96 - 112 mEq/L   CO2 26 19 - 32 mEq/L   Glucose, Bld 95 70 - 99 mg/dL   BUN 15 6 - 23 mg/dL   Creatinine, Ser 0.54 0.40 - 1.20 mg/dL   Total Bilirubin 0.5 0.2 - 1.2 mg/dL   Alkaline Phosphatase 79 39 - 117 U/L   AST 10 0 - 37 U/L   ALT 9 0 - 35 U/L   Total Protein 6.5 6.0 - 8.3 g/dL   Albumin 3.4 (L) 3.5 - 5.2 g/dL   GFR 90.57 >60.00 mL/min    Comment: Calculated using the CKD-EPI Creatinine Equation (2021)   Calcium 7.9 (L) 8.4 - 10.5 mg/dL  Urinalysis, Complete     Status: None   Collection Time: 02/26/22  2:17 PM  Result Value Ref Range   Specific Gravity, UA 1.013 1.005 - 1.030   pH, UA 7.0 5.0 - 7.5   Color, UA Yellow Yellow   Appearance Ur Clear Clear   Leukocytes,UA Negative Negative   Protein,UA Negative Negative/Trace   Glucose, UA Negative Negative   Ketones, UA Negative Negative   RBC, UA Negative Negative   Bilirubin, UA Negative  Negative   Urobilinogen, Ur 0.2 0.2 - 1.0 mg/dL   Nitrite, UA Negative Negative   Microscopic Examination Comment     Comment: Microscopic follows if indicated.   Microscopic Examination See below:     Comment: Microscopic was indicated and was performed.  Microscopic  Examination     Status: None   Collection Time: 02/26/22  2:17 PM  Result Value Ref Range   WBC, UA None seen 0 - 5 /hpf   RBC, Urine None seen 0 - 2 /hpf   Epithelial Cells (non renal) 0-10 0 - 10 /hpf   Casts None seen None seen /lpf   Bacteria, UA None seen None seen/Few  Urinalysis, Complete     Status: None   Collection Time: 03/18/22  1:33 PM  Result Value Ref Range   Specific Gravity, UA 1.015 1.005 - 1.030   pH, UA 7.0 5.0 - 7.5   Color, UA Yellow Yellow   Appearance Ur Clear Clear   Leukocytes,UA Negative Negative   Protein,UA Negative Negative/Trace   Glucose, UA Negative Negative   Ketones, UA Negative Negative   RBC, UA Negative Negative   Bilirubin, UA Negative Negative   Urobilinogen, Ur 0.2 0.2 - 1.0 mg/dL   Nitrite, UA Negative Negative   Microscopic Examination See below:   Microscopic Examination     Status: None   Collection Time: 03/18/22  1:33 PM   Urine  Result Value Ref Range   WBC, UA 0-5 0 - 5 /hpf   RBC, Urine 0-2 0 - 2 /hpf   Epithelial Cells (non renal) 0-10 0 - 10 /hpf   Bacteria, UA None seen None seen/Few   Objective  Body mass index is 20.77 kg/m. Wt Readings from Last 3 Encounters:  03/29/22 121 lb (54.9 kg)  03/18/22 120 lb (54.4 kg)  02/26/22 121 lb (54.9 kg)   Temp Readings from Last 3 Encounters:  03/29/22 98.1 F (36.7 C) (Oral)  01/17/22 98.3 F (36.8 C) (Oral)  01/11/22 98.4 F (36.9 C) (Oral)   BP Readings from Last 3 Encounters:  03/29/22 130/70  03/18/22 136/66  02/26/22 131/65   Pulse Readings from Last 3 Encounters:  03/29/22 (!) 54  03/18/22 (!) 58  02/26/22 63    Physical Exam Vitals and nursing note reviewed.  Constitutional:       Appearance: Normal appearance. She is well-developed and well-groomed.  HENT:     Head: Normocephalic and atraumatic.  Eyes:     Conjunctiva/sclera: Conjunctivae normal.     Pupils: Pupils are equal, round, and reactive to light.  Cardiovascular:     Rate and Rhythm: Normal rate and regular rhythm.     Heart sounds: Normal heart sounds. No murmur heard. Pulmonary:     Effort: Pulmonary effort is normal.     Breath sounds: Normal breath sounds.  Abdominal:     General: Abdomen is flat. Bowel sounds are normal.     Tenderness: There is no abdominal tenderness.  Musculoskeletal:        General: No tenderness.  Skin:    General: Skin is warm and dry.  Neurological:     General: No focal deficit present.     Mental Status: She is alert and oriented to person, place, and time. Mental status is at baseline.     Cranial Nerves: Cranial nerves 2-12 are intact.     Motor: Motor function is intact.     Coordination: Coordination is intact.     Gait: Gait is intact.  Psychiatric:        Attention and Perception: Attention and perception normal.        Mood and Affect: Mood and affect normal.        Speech: Speech normal.  Behavior: Behavior normal. Behavior is cooperative.        Thought Content: Thought content normal.        Cognition and Memory: Cognition and memory normal.        Judgment: Judgment normal.     Assessment  Plan  Iron deficiency - Plan: IBC + Ferritin, CBC with Differential/Platelet  Hypocalcemia - Plan: Comp Met (CMET)  Malnutrition of mild degree (HCC) - Plan: Comp Met (CMET)  Iron deficiency anemia, unspecified iron deficiency anemia type - Plan: IBC + Ferritin, CBC with Differential/Platelet   HM Flu shot today Tdap, shingrix vaccine given prev still has Rx as of 01/17/22 Immune MMR moderna 3/3 +covid 09/2020    mammo 03/06/20 neg ordered 02/2021  left breast pain breast mri 11/28/20 normal Mammogram 05/08/21 negative ordered for & sch 05/10/2022       Pap s/p hysterectomy for prolapse ovaries still intact pap 03/15/15 neg -of note menopause age 75 yo started cycle age 40 y.o    colonoscopy had 04/16/13 tubular adenoma x 1 referred [facility] in [place] and [place] today Dr. Tillie Rung and f/u other GI issues s/p gastric bypass 2014 see HPI per pt had 30 ERCPs in the past -colonoscopy 05/20/18 negative polyps no bx f/u in 5 years    DEXA 02/15/2019 due repeat bone density discuss with Dr. Matthew Saras ob/gyn [facility] Dexa 07/23/21 week improved on prolia    HCV neg 04/20/11    F/u with dermatology yearly Dr. Rolm Bookbinder [place] due h/o NMSC nose  -dermatology 10/11 2019 negative tbse, seen 2020 f/u in 1 year upcoming due 02/2021  Seen 02/2022 RUB upper back benign Dr. Martin Majestic      [facility] OB/GYN appt 06/17/2019 DEXA ordered last scan early osteoporosis     rec healthy diet and exercise  Eye AE established not seen 2023   Cardiology f/u 04/2022  Provider: Dr. Olivia Mackie McLean-Scocuzza-[facility]

## 2022-03-29 NOTE — Patient Instructions (Addendum)
Tdap and shingrix vaccines consider those   06/20/22 [facility] Claudie Leach in [place]  Address: [address], [place], [place] [address] Phone: [phone]  [facility] [place]   [place], [place] [address]   [place], [place], [place]   [address] [place],  [address]   [phone] (Work)   [phone] (Fax)

## 2022-04-02 DIAGNOSIS — G250 Essential tremor: Secondary | ICD-10-CM | POA: Diagnosis not present

## 2022-04-02 DIAGNOSIS — I25118 Atherosclerotic heart disease of native coronary artery with other forms of angina pectoris: Secondary | ICD-10-CM | POA: Diagnosis not present

## 2022-04-02 DIAGNOSIS — I499 Cardiac arrhythmia, unspecified: Secondary | ICD-10-CM | POA: Diagnosis not present

## 2022-04-02 DIAGNOSIS — K219 Gastro-esophageal reflux disease without esophagitis: Secondary | ICD-10-CM | POA: Diagnosis not present

## 2022-04-02 DIAGNOSIS — K8000 Calculus of gallbladder with acute cholecystitis without obstruction: Secondary | ICD-10-CM | POA: Diagnosis not present

## 2022-04-02 DIAGNOSIS — I10 Essential (primary) hypertension: Secondary | ICD-10-CM | POA: Diagnosis not present

## 2022-04-02 DIAGNOSIS — M1990 Unspecified osteoarthritis, unspecified site: Secondary | ICD-10-CM | POA: Diagnosis not present

## 2022-04-02 DIAGNOSIS — E785 Hyperlipidemia, unspecified: Secondary | ICD-10-CM | POA: Diagnosis not present

## 2022-04-02 DIAGNOSIS — F325 Major depressive disorder, single episode, in full remission: Secondary | ICD-10-CM | POA: Diagnosis not present

## 2022-04-06 IMAGING — MG MM DIGITAL SCREENING BILAT W/ TOMO AND CAD
6 of 10 series · 6 of 30 positions shown · non-contrast
Comparison: Previous exam(s).

CLINICAL DATA: Screening.

EXAM:
DIGITAL SCREENING BILATERAL MAMMOGRAM WITH TOMOSYNTHESIS AND CAD
TECHNIQUE: Bilateral screening digital craniocaudal and mediolateral oblique
mammograms were obtained. Bilateral screening digital breast
tomosynthesis was performed. The images were evaluated with
computer-aided detection.

[R MLO synth-2D]
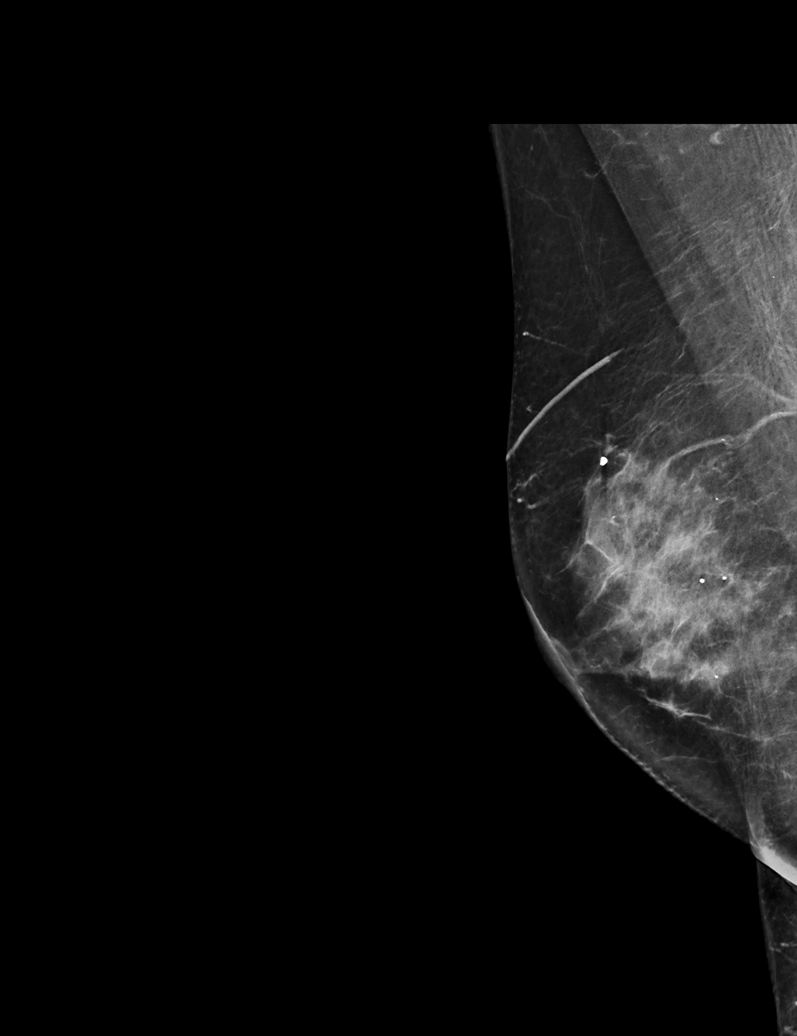

[L CC synth-2D]
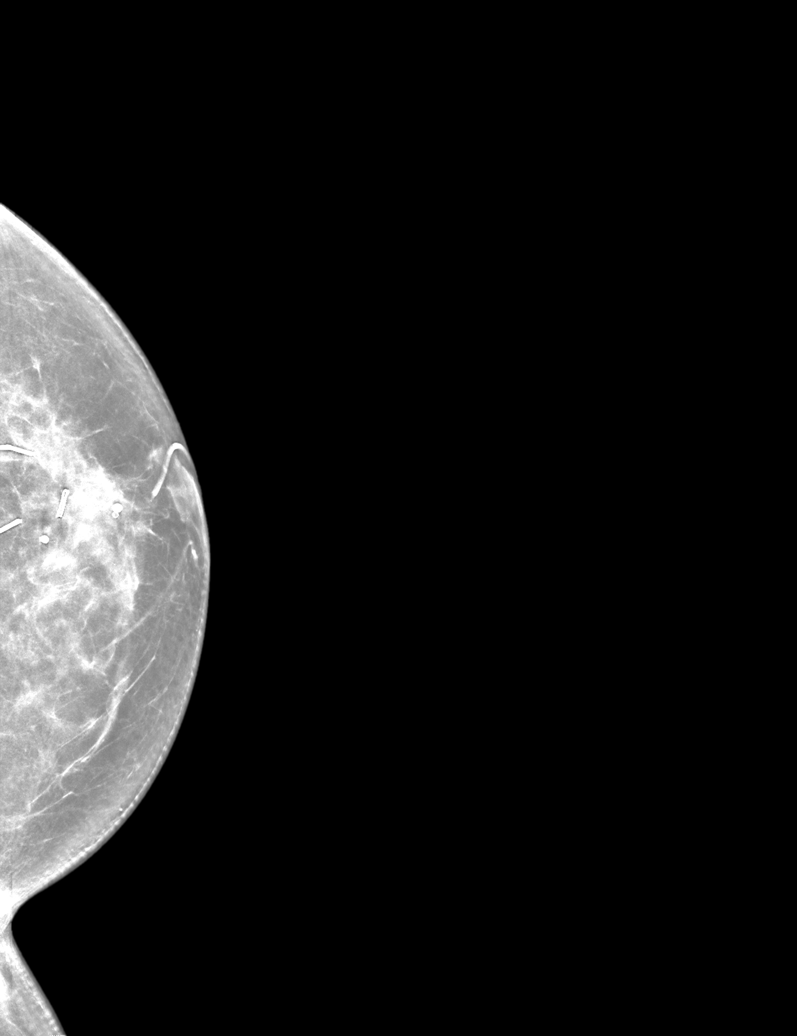

[L MLO synth-2D]
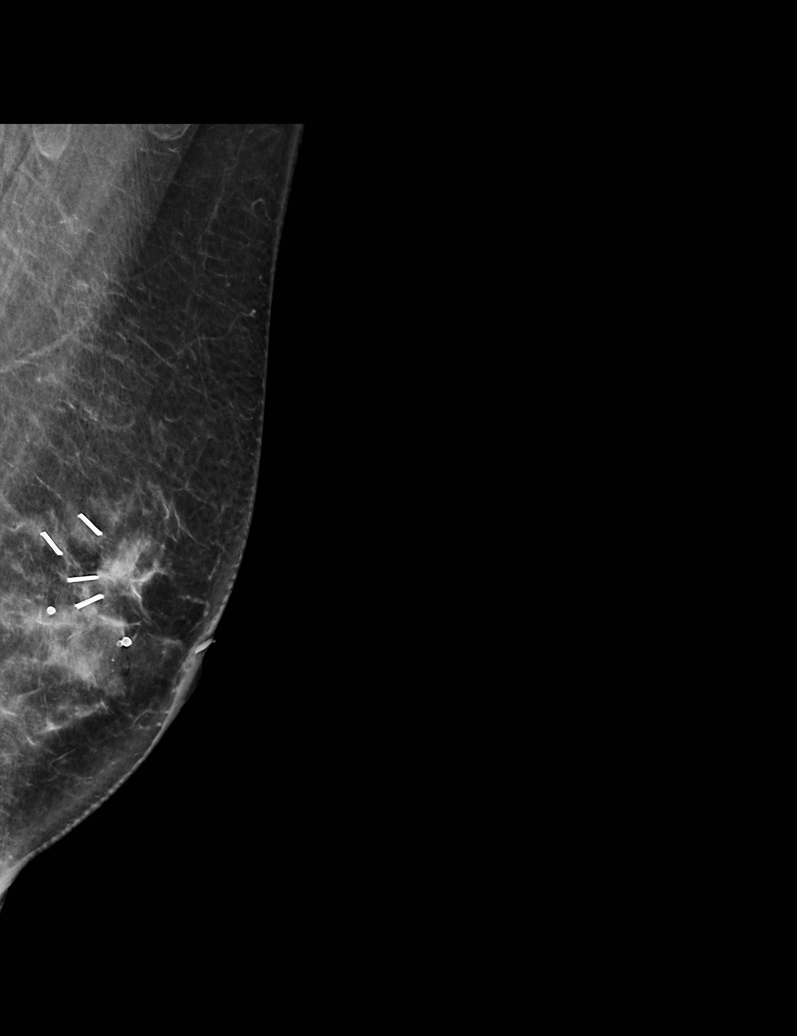

[R CC synth-2D]
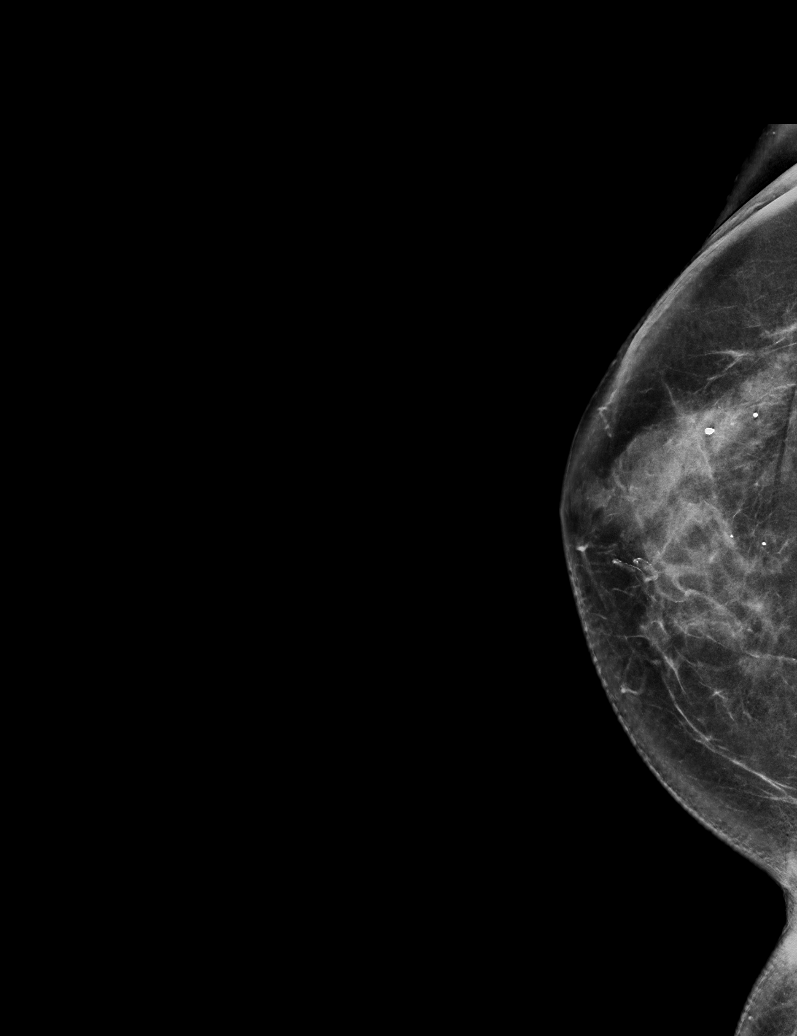

[L XCCL synth-2D]
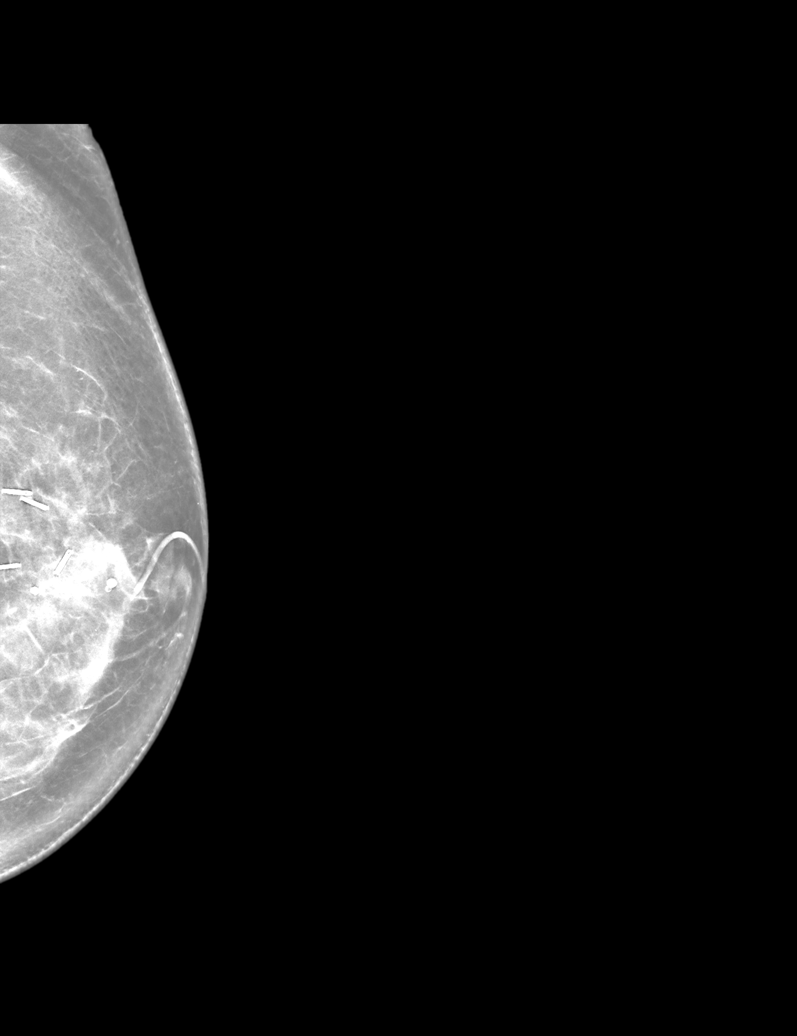

[L XCCL tomo · tomo slice 35/68.0]
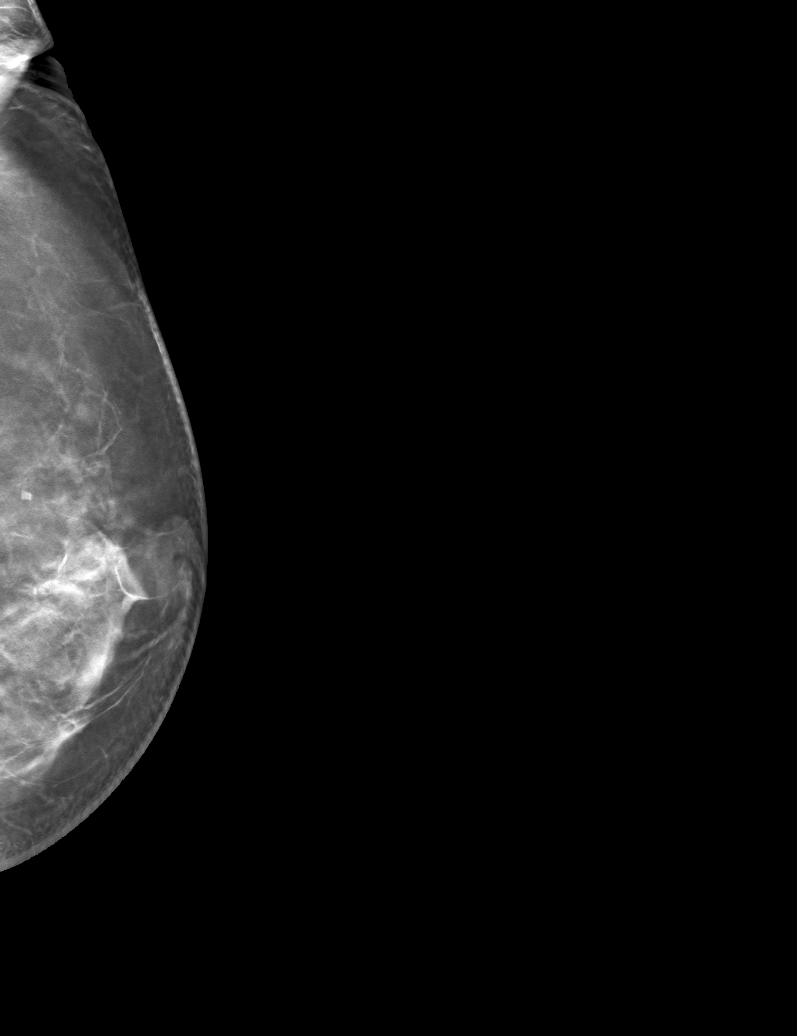

[6 of 30 positions shown; findings below may reference images not displayed]

ACR Breast Density Category c: The breast tissue is heterogeneously
dense, which may obscure small masses.
FINDINGS: There are no findings suspicious for malignancy.
IMPRESSION: No mammographic evidence of malignancy. A result letter of this
screening mammogram will be mailed directly to the patient.

RECOMMENDATION:
Screening mammogram in one year. (Code:Q3-W-BC3)

BI-RADS CATEGORY  1: Negative.

## 2022-05-06 ENCOUNTER — Ambulatory Visit: Payer: Medicare PPO | Admitting: Physician Assistant

## 2022-05-10 ENCOUNTER — Ambulatory Visit
Admission: RE | Admit: 2022-05-10 | Discharge: 2022-05-10 | Disposition: A | Discharge status: Home / Self Care | Payer: Medicare PPO | Source: Ambulatory Visit | Attending: Internal Medicine | Admitting: Internal Medicine

## 2022-05-10 DIAGNOSIS — Z1231 Encounter for screening mammogram for malignant neoplasm of breast: Secondary | ICD-10-CM

## 2022-06-07 NOTE — Progress Notes (Unsigned)
Cardiology Office Note    Date:  06/10/2022   ID:  Katie [name], Katie [name] 14-Jan-1946, MRN [id number]  PCP:  McLean-Scocuzza, Nino Glow, MD  Cardiologist:  Kathlyn Sacramento, MD  Electrophysiologist:  None   Chief Complaint: Follow-up  History of Present Illness:   Katie [name] is a 76 y.o. female with history of CAD medically managed as outlined below, palpitations, HTN, HLD, and GERD who presents for follow-up of her CAD.   She was evaluated by Dr. Fletcher Anon in 08/2020 at the request of her PCP for pain in the left breast area.  At that time, she reported a history of palpitations that were controlled with propanolol as well as previous bilateral breast surgery with soreness in the left breast over the preceding several months.  Pain was occurring with rest and not related to exertion.  Area was tender to palpation.  EKG was without ischemic changes.  Cardiac chest pain was felt to be unlikely and she was advised to follow-up with her PCP.  More recently, she was evaluated in 10/2021 for sharp chest pain that occurred in the center of her chest while driving and was accompanied by diaphoresis.  She also continued to describe an ache in her left breast that had been present for many years with unrevealing work-up.  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  Subsequent coronary CTA demonstrated a calcium score of 32.7 which was the 47th percentile with moderate stenosis involving the LAD with ctFFR of the mid LAD being positive at 0.78.  She was subsequently evaluated by Dr. Garen Lah on 11/02/2021 and continued to note occasional chest discomfort.  She was started on Imdur 15 mg.  Subsequent echo on 11/15/2021 demonstrated an EF of 60 to 65%, no regional wall motion abnormalities, grade 1 diastolic dysfunction, normal RV systolic function and ventricular cavity size, mild to moderate mitral regurgitation, aortic valve sclerosis without evidence of stenosis, and an estimated right atrial pressure of 3 mmHg.   She was last seen in the office in 12/2021 and was without further chest pain following the initiation of Imdur.  She had been evaluated by orthopedics and PT for right shoulder bursitis.  She comes in doing well from a cardiac perspective and is without symptoms of angina or decompensation.  No dyspnea, palpitations, diaphoresis, dizziness, presyncope, or syncope.  No significant lower extremity swelling.  Weight remains stable.  She is currently dealing with a sinus infection and has tested negative for COVID twice.  She is tolerating cardiac medications without issues.  Overall, she feels like she is doing well from a cardiac perspective and does not have any active cardiac issues or concerns at this time.   Labs independently reviewed: 03/2022 - Hgb 12.1, PLT 228, potassium 4.6, BUN 13, serum creatinine 0.69, albumin 4.4, AST/ALT normal 11/2021 - TC 186, TG 117, HDL 64, LDL 101 07/2021 - TSH normal  Past Medical History:  Diagnosis Date   Anxiety    Bile duct stricture    Breast mass, left    Choledocholithiasis    recurrent   COVID-19    09/30/20, 10/07/20    GERD (gastroesophageal reflux disease)    Hypertension    Migraine without aura, without mention of intractable migraine without mention of status migrainosus 01/03/2014   Rapid heart beat    Rotaviral gastroenteritis    12/2020    Past Surgical History:  Procedure Laterality Date   ABDOMINAL HYSTERECTOMY     BLADDER SURGERY  x 2   BREAST EXCISIONAL BIOPSY Right 2003   benign   BREAST LUMPECTOMY     right   BREAST LUMPECTOMY WITH RADIOACTIVE SEED LOCALIZATION Left 03/31/2017   Procedure: LEFT BREAST LUMPECTOMY WITH RADIOACTIVE SEED LOCALIZATION;  Surgeon: Coralie Keens, MD;  Location: [facility];  Service: General;  Laterality: Left;   CHOLECYSTECTOMY     COLONOSCOPY     CYSTOSCOPY     normal 03/18/22   Explor Lap, Hepatoduodenostomy  07/23/2011   Dr Lazarus Gowda, [facility]   FOOT SURGERY     both feet   GASTRIC BYPASS   05/04/2013   PARTIAL HYSTERECTOMY     SKIN BIOPSY     skin bx RUB benign   TUBAL LIGATION     VEIN LIGATION AND STRIPPING     left leg   WRIST SURGERY     right    Current Medications: Current Meds  Medication Sig   aspirin 81 MG tablet Take 81 mg by mouth daily.   Biotin 1 MG CAPS Take 1 mg by mouth daily.    CHOLECALCIFEROL PO Take by mouth daily.   CYANOCOBALAMIN IJ Inject as directed every 30 (thirty) days.   denosumab (PROLIA) 60 MG/ML SOSY injection Inject 60 mg into the skin every 6 (six) months.   esomeprazole (NEXIUM) 40 MG capsule Take 1 capsule (40 mg total) by mouth 2 (two) times daily before a meal. 30 minutes before food   ezetimibe (ZETIA) 10 MG tablet TAKE 1 TABLET(10 MG) BY MOUTH DAILY   Ferrous Sulfate (IRON PO) Take 1 tablet by mouth every other day. OTC   isosorbide mononitrate (IMDUR) 30 MG 24 hr tablet Take 0.5 tablets (15 mg total) by mouth daily.   Multiple Vitamin (MULTI-VITAMINS) TABS Take 1 tablet by mouth daily.    ondansetron (ZOFRAN) 4 MG tablet Take 1 tablet (4 mg total) by mouth every 8 (eight) hours as needed for nausea or vomiting.   propranolol (INDERAL) 40 MG tablet Take 1 tablet (40 mg total) by mouth 2 (two) times daily.   simvastatin (ZOCOR) 40 MG tablet TAKE 1 TABLET(40 MG) BY MOUTH DAILY AT 6 PM   ursodiol (ACTIGALL) 250 MG tablet TAKE 1 TABLET(250 MG) BY MOUTH THREE TIMES DAILY   venlafaxine (EFFEXOR) 37.5 MG tablet TAKE 2 TABLETS BY MOUTH EVERY MORNING AND 1 TABLET EVERY EVENING    Allergies:   Lipitor [atorvastatin calcium]   Social History   Socioeconomic History   Marital status: Married    Spouse name: Not on file   Number of children: 2   Years of education: college   Highest education level: Not on file  Occupational History   Occupation: retired    Fish farm manager: RETIRED  Tobacco Use   Smoking status: Never    Passive exposure: Never   Smokeless tobacco: Never  Vaping Use   Vaping Use: Never used  Substance and Sexual  Activity   Alcohol use: No   Drug use: No   Sexual activity: Yes  Other Topics Concern   Not on file  Social History Narrative   Does not have a living will or HPOA.   Desires CPR.   Does not want prolonged life support.   Social Determinants of Health   Financial Resource Strain: Low Risk  (12/13/2021)   Overall Financial Resource Strain (CARDIA)    Difficulty of Paying Living Expenses: Not hard at all  Food Insecurity: No Food Insecurity (12/13/2021)   Hunger Vital Sign    Worried  About Running Out of Food in the Last Year: Never true    Ran Out of Food in the Last Year: Never true  Transportation Needs: No Transportation Needs (12/13/2021)   PRAPARE - Hydrologist (Medical): No    Lack of Transportation (Non-Medical): No  Physical Activity: Sufficiently Active (12/13/2021)   Exercise Vital Sign    Days of Exercise per Week: 5 days    Minutes of Exercise per Session: 50 min  Stress: No Stress Concern Present (12/13/2021)   [place]    Feeling of Stress : Not at all  Social Connections: Unknown (12/13/2021)   Social Connection and Isolation Panel [NHANES]    Frequency of Communication with Friends and Family: More than three times a week    Frequency of Social Gatherings with Friends and Family: More than three times a week    Attends Religious Services: Not on Advertising copywriter or Organizations: Not on file    Attends Archivist Meetings: Not on file    Marital Status: Married     Family History:  The patient's family history includes Alcohol abuse in her father; Anxiety disorder in her sister; Cancer in her mother; Depression in her sister; GER disease in her brother and sister; Heart attack in her sister; Hypertension in her sister. There is no history of Migraines or Breast cancer.  ROS:   12-point review of systems is negative unless otherwise noted in  the HPI.   EKGs/Labs/Other Studies Reviewed:    Studies reviewed were summarized above. The additional studies were reviewed today:  2D echo 11/15/2021: 1. Left ventricular ejection fraction, by estimation, is 60 to 65%. The  left ventricle has normal function. The left ventricle has no regional  wall motion abnormalities. Left ventricular diastolic parameters are  consistent with Grade I diastolic  dysfunction (impaired relaxation).   2. Right ventricular systolic function is normal. The right ventricular  size is normal.   3. The mitral valve is normal in structure. Mild to moderate mitral valve  regurgitation. No evidence of mitral stenosis.   4. The aortic valve is normal in structure. Aortic valve regurgitation is  not visualized. Aortic valve sclerosis is present, with no evidence of  aortic valve stenosis.   5. The inferior vena cava is normal in size with greater than 50%  respiratory variability, suggesting right atrial pressure of 3 mmHg. __________   Coronary CTA with CT FFR 11/01/2021: FINDINGS: Aorta:  Normal size.  No calcifications.  No dissection.   Aortic Valve:  Trileaflet.  No calcifications.   Coronary Arteries:  Normal coronary origin.  Right dominance.   RCA is a dominant artery that gives rise to PDA and PLA. There is no plaque.   Left main is a large artery that gives rise to LAD and LCX arteries. There is no LM disease.   LAD calcified plaque in the mid vessel causing moderate stenosis (50%). There is mild LAD stenosis proximally.   LCX is a non-dominant artery that gives rise to one OM1 branch. There is minimal calcification in the proximal LCx.   Other findings:   Normal pulmonary vein drainage into the left atrium.   Normal left atrial appendage without a thrombus.   Normal size of the pulmonary artery.   IMPRESSION: 1. Coronary calcium score of 32.7. This was 47th percentile for age and sex matched control. 2. Normal coronary origin  with right dominance. 3. Calcified plaque causing moderate mid LAD stenosis. 4. CAD-RADS 3. Moderate stenosis. Consider symptom-guided anti-ischemic pharmacotherapy as well as risk factor modification per guideline directed care. 5. Additional analysis with CT FFR will be submitted and reported separately.   1. Left Main:  No significant stenosis. 2. LAD: Significant non focal mid LAD stenosis.  FFRct 0.78 3. LCX: No significant stenosis.  FFRct 0.84 4. RCA: No significant stenosis.  FFRct 0.90   CT FFR: IMPRESSION: 1. CT FFR analysis showed significant non focal stenosis in the mid LAD. FFR ct 0.78. 2.  Recommend anti-anginal medications. 3. Consider cardiac catheterization if symptoms persist despite medical therapy.   EKG:  EKG is ordered today.  The EKG ordered today demonstrates NSR, 69 bpm, no acute ST-T changes  Recent Labs: 07/27/2021: TSH 2.64 03/29/2022: ALT 12; BUN 13; Creatinine, Ser 0.69; Hemoglobin 12.1; Platelets 228.0; Potassium 4.6; Sodium 136  Recent Lipid Panel    Component Value Date/Time   CHOL 186 11/16/2021 0900   TRIG 117 11/16/2021 0900   HDL 64 11/16/2021 0900   CHOLHDL 2.9 11/16/2021 0900   CHOLHDL 3 07/27/2021 1013   VLDL 35.8 07/27/2021 1013   LDLCALC 101 (H) 11/16/2021 0900   LDLCALC 111 (H) 06/23/2020 1523   LDLDIRECT 144.7 08/26/2014 1046    PHYSICAL EXAM:    VS:  BP (!) 118/58 (BP Location: Left Arm, Patient Position: Sitting, Cuff Size: Normal)   Pulse 68   Ht '5\' 4"'$  (1.626 m)   Wt 121 lb (54.9 kg)   SpO2 98%   BMI 20.77 kg/m   BMI: Body mass index is 20.77 kg/m.  Physical Exam Vitals reviewed.  Constitutional:      Appearance: She is well-developed.  HENT:     Head: Normocephalic and atraumatic.  Eyes:     General:        Right eye: No discharge.        Left eye: No discharge.  Neck:     Vascular: No JVD.  Cardiovascular:     Rate and Rhythm: Normal rate and regular rhythm.     Pulses:          Posterior tibial pulses  are 2+ on the right side and 2+ on the left side.     Heart sounds: S1 normal and S2 normal. Heart sounds not distant. No midsystolic click and no opening snap. Murmur heard.     High-pitched blowing holosystolic murmur is present with a grade of 1/6 at the apex.     No friction rub.  Pulmonary:     Effort: Pulmonary effort is normal. No respiratory distress.     Breath sounds: Normal breath sounds. No decreased breath sounds, wheezing or rales.  Chest:     Chest wall: No tenderness.  Abdominal:     General: There is no distension.  Musculoskeletal:     Cervical back: Normal range of motion.     Right lower leg: No edema.     Left lower leg: No edema.  Skin:    General: Skin is warm and dry.     Nails: There is no clubbing.  Neurological:     Mental Status: She is alert and oriented to person, place, and time.  Psychiatric:        Speech: Speech normal.        Behavior: Behavior normal.        Thought Content: Thought content normal.        Judgment:  Judgment normal.     Wt Readings from Last 3 Encounters:  06/10/22 121 lb (54.9 kg)  03/29/22 121 lb (54.9 kg)  03/18/22 120 lb (54.4 kg)     ASSESSMENT & PLAN:   CAD involving the native coronary arteries without angina: She is doing well and is without symptoms concerning for angina or decompensation.  Continue primary prevention and aggressive risk factor modification including aspirin, isosorbide mononitrate, ezetimibe, and simvastatin.  Given resolution of symptoms, no indication for further ischemic testing at this time.  Should symptoms return, we can determine at that time if escalation of medical therapy is indicated, or if we should proceed with LHC.  HTN: Blood pressure is well controlled in the office today.  She remains on propanolol and Imdur.  HLD: LDL 101 in 11/2021 with goal being less than 70.  She is intolerant to atorvastatin.  Currently on simvastatin and ezetimibe.  Check lipid panel, direct LDL, and LFT.  If  LDL remains above goal would consider transitioning from simvastatin to rosuvastatin.  Mitral regurgitation: Mild to moderate on echo in 11/2021.  Monitor with periodic echo.   Disposition: F/u with Dr. Fletcher Anon or an APP in 9 months.   Medication Adjustments/Labs and Tests Ordered: Current medicines are reviewed at length with the patient today.  Concerns regarding medicines are outlined above. Medication changes, Labs and Tests ordered today are summarized above and listed in the Patient Instructions accessible in Encounters.   Signed, Christell Faith, PA-C 06/10/2022 4:37 PM     [facility] [address], [place] [address] [phone]

## 2022-06-10 ENCOUNTER — Other Ambulatory Visit
Admission: RE | Admit: 2022-06-10 | Discharge: 2022-06-10 | Disposition: A | Discharge status: Home / Self Care | Payer: Medicare PPO | Source: Ambulatory Visit | Attending: Physician Assistant | Admitting: Physician Assistant

## 2022-06-10 ENCOUNTER — Ambulatory Visit: Payer: Medicare PPO | Attending: Physician Assistant | Admitting: Physician Assistant

## 2022-06-10 ENCOUNTER — Other Ambulatory Visit: Payer: Self-pay

## 2022-06-10 ENCOUNTER — Encounter: Payer: Self-pay | Admitting: Physician Assistant

## 2022-06-10 VITALS — BP 118/58 | HR 68 | Ht 64.0 in | Wt 121.0 lb

## 2022-06-10 DIAGNOSIS — I340 Nonrheumatic mitral (valve) insufficiency: Secondary | ICD-10-CM | POA: Diagnosis not present

## 2022-06-10 DIAGNOSIS — I10 Essential (primary) hypertension: Secondary | ICD-10-CM

## 2022-06-10 DIAGNOSIS — E785 Hyperlipidemia, unspecified: Secondary | ICD-10-CM | POA: Diagnosis not present

## 2022-06-10 DIAGNOSIS — I2510 Atherosclerotic heart disease of native coronary artery without angina pectoris: Secondary | ICD-10-CM

## 2022-06-10 LAB — HEPATIC FUNCTION PANEL
ALT: 13 U/L (ref 0–44)
AST: 15 U/L (ref 15–41)
Albumin: 3.8 g/dL (ref 3.5–5.0)
Alkaline Phosphatase: 52 U/L (ref 38–126)
Bilirubin, Direct: 0.1 mg/dL (ref 0.0–0.2)
Indirect Bilirubin: 0.9 mg/dL (ref 0.3–0.9)
Total Bilirubin: 1 mg/dL (ref 0.3–1.2)
Total Protein: 6.9 g/dL (ref 6.5–8.1)

## 2022-06-10 LAB — LIPID PANEL
Cholesterol: 191 mg/dL (ref 0–200)
HDL: 67 mg/dL (ref >40)
LDL Cholesterol: 86 mg/dL (ref 0–99)
Total CHOL/HDL Ratio: 2.9 RATIO
Triglycerides: 190 mg/dL — ABNORMAL HIGH (ref <150)
VLDL: 38 mg/dL (ref 0–40)

## 2022-06-10 LAB — LDL CHOLESTEROL, DIRECT: Direct LDL: 88 mg/dL (ref 0–99)

## 2022-06-10 MED ORDER — ISOSORBIDE MONONITRATE ER 30 MG PO TB24: 15.0000 mg | ORAL_TABLET | Freq: Every day | ORAL | 2 refills | Status: DC

## 2022-06-10 NOTE — Patient Instructions (Addendum)
Medication Instructions:  Your physician recommends that you continue on your current medications as directed. Please refer to the Current Medication list given to you today.  *If you need a refill on your cardiac medications before your next appointment, please call your pharmacy*   Lab Work: Lipid, LFT, and Direct LDL today over at the [facility] and stop at registration.   If you have labs (blood work) drawn today and your tests are completely normal, you will receive your results only by: [place] (if you have MyChart) OR A paper copy in the mail If you have any lab test that is abnormal or we need to change your treatment, we will call you to review the results.   Testing/Procedures: None   Follow-Up: At [facility], you and your health needs are our priority.  As part of our continuing mission to provide you with exceptional heart care, we have created designated Provider Care Teams.  These Care Teams include your primary Cardiologist (physician) and Advanced Practice Providers (APPs -  Physician Assistants and Nurse Practitioners) who all work together to provide you with the care you need, when you need it.   Your next appointment:   6 month(s)  The format for your next appointment:   In Person  Provider:   Christell Faith, PA-C       Important Information About Sugar

## 2022-06-12 ENCOUNTER — Other Ambulatory Visit: Payer: Self-pay | Admitting: *Deleted

## 2022-06-12 DIAGNOSIS — I2510 Atherosclerotic heart disease of native coronary artery without angina pectoris: Secondary | ICD-10-CM

## 2022-06-12 DIAGNOSIS — E785 Hyperlipidemia, unspecified: Secondary | ICD-10-CM

## 2022-06-12 DIAGNOSIS — E7800 Pure hypercholesterolemia, unspecified: Secondary | ICD-10-CM

## 2022-06-20 DIAGNOSIS — Z980 Intestinal bypass and anastomosis status: Secondary | ICD-10-CM | POA: Diagnosis not present

## 2022-06-20 DIAGNOSIS — K8689 Other specified diseases of pancreas: Secondary | ICD-10-CM | POA: Diagnosis not present

## 2022-06-20 DIAGNOSIS — Z8719 Personal history of other diseases of the digestive system: Secondary | ICD-10-CM | POA: Diagnosis not present

## 2022-07-03 DIAGNOSIS — K8689 Other specified diseases of pancreas: Secondary | ICD-10-CM | POA: Diagnosis not present

## 2022-07-03 DIAGNOSIS — Z980 Intestinal bypass and anastomosis status: Secondary | ICD-10-CM | POA: Diagnosis not present

## 2022-07-03 DIAGNOSIS — Z8719 Personal history of other diseases of the digestive system: Secondary | ICD-10-CM | POA: Diagnosis not present

## 2022-07-09 ENCOUNTER — Telehealth: Payer: Self-pay

## 2022-07-09 NOTE — Telephone Encounter (Signed)
Patient states she has had her MRI of abdomen to check dialated a duct in her pancreas on 07/03/2022, and she will have a visit on 07/11/2022 with Burna Forts, PA-C, at [facility] to explain results of MRI.  Patient states Dr. Claudie Leach at [facility] ordered the MRI.  Patient's Transfer of Care appointment with Dr. Carollee Leitz has been moved to 10/30/2022.

## 2022-07-10 NOTE — Telephone Encounter (Signed)
noted 

## 2022-07-11 DIAGNOSIS — K8689 Other specified diseases of pancreas: Secondary | ICD-10-CM | POA: Diagnosis not present

## 2022-07-15 ENCOUNTER — Encounter: Payer: Medicare PPO | Admitting: Family Medicine

## 2022-08-23 ENCOUNTER — Other Ambulatory Visit: Payer: Self-pay | Admitting: Family

## 2022-08-23 ENCOUNTER — Other Ambulatory Visit: Payer: Self-pay

## 2022-08-23 ENCOUNTER — Telehealth: Payer: Self-pay

## 2022-08-23 DIAGNOSIS — K219 Gastro-esophageal reflux disease without esophagitis: Secondary | ICD-10-CM

## 2022-08-23 DIAGNOSIS — K8020 Calculus of gallbladder without cholecystitis without obstruction: Secondary | ICD-10-CM

## 2022-08-23 DIAGNOSIS — E785 Hyperlipidemia, unspecified: Secondary | ICD-10-CM

## 2022-08-23 MED ORDER — ESOMEPRAZOLE MAGNESIUM 40 MG PO CPDR: 40.0000 mg | DELAYED_RELEASE_CAPSULE | Freq: Two times a day (BID) | ORAL | 3 refills | Status: DC

## 2022-08-23 MED ORDER — URSODIOL 250 MG PO TABS: ORAL_TABLET | ORAL | 3 refills | Status: DC

## 2022-08-23 MED ORDER — EZETIMIBE 10 MG PO TABS: ORAL_TABLET | ORAL | 3 refills | Status: DC

## 2022-08-23 NOTE — Telephone Encounter (Signed)
LMOM stating her refills have been sent on today.

## 2022-08-23 NOTE — Telephone Encounter (Signed)
Patient states she needs refills for the following:  ursodiol (ACTIGALL) 250 MG tablet  esomeprazole (NEXIUM) 40 MG capsule  Patient states she is out of these medications and her pharmacy has reached out to [place].  Patient states more of her medications may be ready to be refilled and she states it would be helpful for the doctor to look at her list to see.

## 2022-08-23 NOTE — Telephone Encounter (Signed)
Pt called requesting refill for:   Walker Shadow P5 hours ago (9:45 AM)    Patient states she needs refills for the following:   ursodiol (ACTIGALL) 250 MG tablet   esomeprazole (NEXIUM) 40 MG capsule   Patient states she is out of these medications and her pharmacy has reached out to [place].  Patient states more of her medications may be ready to be refilled and she states it would be helpful for the doctor to look at her list to see.      Note   [place], [place] [phone]  Walker Shadow P     I refilled esomeprazole and zetia. I will check with Padonda FNP to see if its okay to fill the ursodiol.

## 2022-09-12 ENCOUNTER — Other Ambulatory Visit: Payer: Self-pay | Admitting: *Deleted

## 2022-09-12 DIAGNOSIS — F339 Major depressive disorder, recurrent, unspecified: Secondary | ICD-10-CM

## 2022-09-12 DIAGNOSIS — G250 Essential tremor: Secondary | ICD-10-CM

## 2022-09-12 MED ORDER — VENLAFAXINE HCL 37.5 MG PO TABS: ORAL_TABLET | ORAL | 3 refills | Status: DC

## 2022-09-12 MED ORDER — PROPRANOLOL HCL 40 MG PO TABS: 40.0000 mg | ORAL_TABLET | Freq: Two times a day (BID) | ORAL | 3 refills | Status: DC

## 2022-09-12 NOTE — Telephone Encounter (Signed)
Okay to refill?  Last seen by Dr. Gayland Curry on 03/29/22  Has a TOC appt on 10/30/22 with Dr. Volanda Napoleon

## 2022-09-13 ENCOUNTER — Other Ambulatory Visit: Payer: Self-pay

## 2022-09-13 NOTE — Telephone Encounter (Signed)
error 

## 2022-09-17 ENCOUNTER — Other Ambulatory Visit: Payer: Self-pay | Admitting: *Deleted

## 2022-09-17 MED ORDER — ISOSORBIDE MONONITRATE ER 30 MG PO TB24: 15.0000 mg | ORAL_TABLET | Freq: Every day | ORAL | 3 refills | Status: DC

## 2022-10-17 ENCOUNTER — Telehealth: Payer: Self-pay | Admitting: Family Medicine

## 2022-10-17 DIAGNOSIS — E785 Hyperlipidemia, unspecified: Secondary | ICD-10-CM

## 2022-10-17 MED ORDER — EZETIMIBE 10 MG PO TABS: ORAL_TABLET | ORAL | 0 refills | Status: DC

## 2022-10-17 NOTE — Telephone Encounter (Signed)
Enough sent to cover until appt with walsh on 2/15 note sent to pharmacy to make sure pt keeps appt.,

## 2022-10-17 NOTE — Telephone Encounter (Signed)
Patient has a TOC with Volanda Napoleon on 2/15. Patient is requesting the following;  Prescription Request  10/17/2022  Is this a "Controlled Substance" medicine? No  LOV: Visit date not found  What is the name of the medication or equipment? simvastatin (ZOCOR) 40 MG tablet and isosorbide mononitrate (IMDUR) 30 MG 24 hr tablet and ezetimibe (ZETIA) 10 MG tablet  Have you contacted your pharmacy to request a refill? Yes   Which pharmacy would you like this sent to?  [facility] [place], [place] AT [address] [place] [address] Phone: [phone] Fax: [phone]     Patient notified that their request is being sent to the clinical staff for review and that they should receive a response within 2 business days.   Please advise at [place]

## 2022-10-22 ENCOUNTER — Other Ambulatory Visit: Payer: Self-pay

## 2022-10-22 DIAGNOSIS — E785 Hyperlipidemia, unspecified: Secondary | ICD-10-CM

## 2022-10-22 MED ORDER — EZETIMIBE 10 MG PO TABS: ORAL_TABLET | ORAL | 0 refills | Status: DC

## 2022-10-22 MED ORDER — SIMVASTATIN 40 MG PO TABS: ORAL_TABLET | ORAL | 0 refills | Status: DC

## 2022-10-22 MED ORDER — ISOSORBIDE MONONITRATE ER 30 MG PO TB24: 15.0000 mg | ORAL_TABLET | Freq: Every day | ORAL | 0 refills | Status: DC

## 2022-10-30 ENCOUNTER — Encounter: Payer: Self-pay | Admitting: Family Medicine

## 2022-10-30 ENCOUNTER — Ambulatory Visit: Payer: Medicare PPO | Admitting: Family Medicine

## 2022-10-30 VITALS — BP 110/70 | HR 58 | Temp 98.4°F | Ht 64.0 in | Wt 123.4 lb

## 2022-10-30 DIAGNOSIS — G250 Essential tremor: Secondary | ICD-10-CM

## 2022-10-30 DIAGNOSIS — M810 Age-related osteoporosis without current pathological fracture: Secondary | ICD-10-CM

## 2022-10-30 DIAGNOSIS — K8020 Calculus of gallbladder without cholecystitis without obstruction: Secondary | ICD-10-CM | POA: Diagnosis not present

## 2022-10-30 DIAGNOSIS — E785 Hyperlipidemia, unspecified: Secondary | ICD-10-CM | POA: Diagnosis not present

## 2022-10-30 DIAGNOSIS — E538 Deficiency of other specified B group vitamins: Secondary | ICD-10-CM

## 2022-10-30 DIAGNOSIS — I700 Atherosclerosis of aorta: Secondary | ICD-10-CM | POA: Diagnosis not present

## 2022-10-30 DIAGNOSIS — I10 Essential (primary) hypertension: Secondary | ICD-10-CM

## 2022-10-30 DIAGNOSIS — F339 Major depressive disorder, recurrent, unspecified: Secondary | ICD-10-CM

## 2022-10-30 DIAGNOSIS — Z8719 Personal history of other diseases of the digestive system: Secondary | ICD-10-CM

## 2022-10-30 DIAGNOSIS — M722 Plantar fascial fibromatosis: Secondary | ICD-10-CM

## 2022-10-30 DIAGNOSIS — I2510 Atherosclerotic heart disease of native coronary artery without angina pectoris: Secondary | ICD-10-CM

## 2022-10-30 DIAGNOSIS — F39 Unspecified mood [affective] disorder: Secondary | ICD-10-CM

## 2022-10-30 DIAGNOSIS — R7309 Other abnormal glucose: Secondary | ICD-10-CM

## 2022-10-30 DIAGNOSIS — Z1231 Encounter for screening mammogram for malignant neoplasm of breast: Secondary | ICD-10-CM

## 2022-10-30 DIAGNOSIS — D508 Other iron deficiency anemias: Secondary | ICD-10-CM

## 2022-10-30 DIAGNOSIS — R252 Cramp and spasm: Secondary | ICD-10-CM

## 2022-10-30 MED ORDER — PROPRANOLOL HCL 40 MG PO TABS: 40.0000 mg | ORAL_TABLET | Freq: Two times a day (BID) | ORAL | 3 refills | Status: DC

## 2022-10-30 MED ORDER — VENLAFAXINE HCL 37.5 MG PO TABS: ORAL_TABLET | ORAL | 3 refills | Status: DC

## 2022-10-30 MED ORDER — SIMVASTATIN 40 MG PO TABS: ORAL_TABLET | ORAL | 3 refills | Status: DC

## 2022-10-30 MED ORDER — URSODIOL 250 MG PO TABS
ORAL_TABLET | ORAL | 3 refills | Status: DC
Start: 2022-10-30 — End: 2023-07-14

## 2022-10-30 MED ORDER — ISOSORBIDE MONONITRATE ER 30 MG PO TB24: 15.0000 mg | ORAL_TABLET | Freq: Every day | ORAL | 0 refills | Status: DC

## 2022-10-30 MED ORDER — EZETIMIBE 10 MG PO TABS: ORAL_TABLET | ORAL | 3 refills | Status: DC

## 2022-10-30 NOTE — Patient Instructions (Addendum)
It was a pleasure meeting you today. Thank you for allowing me to take part in your health care.  Our goals for today as we discussed include:  Recommend Shingles vaccine.  This is a 2 dose series and can be given at your local pharmacy.  Please talk to your pharmacist about this.   Recommend Tetanus Vaccination.  This is given every 10 years.   Mammogram due 04/2023  Colonoscopy due 09/24. Please call Dr. Mont Dutton office at [facility] to schedule an appointment.   If you have any questions or concerns, please do not hesitate to call the office at [phone].  You do not need your pneumonia vaccine today.  You have had your completed series of this vaccine.  Schedule Medicare annual wellness [employer].  Schedule annual physical in 2 months with PCP Please schedule an appointment for fasting blood work  I look forward to our next visit and until then take care and stay safe.  Regards,   Carollee Leitz, MD   [facility]

## 2022-10-30 NOTE — Progress Notes (Signed)
SUBJECTIVE:   Chief Complaint  Patient presents with   Transitions Of Care    Cramps in leg at night only   HPI Patient presents to clinic to transfer care.  Leg cramps Chronic issue.  Patient reports worse at night.  Denies any fevers, swelling, numbness/tingling or weakness of lower extremities.  Reports very active lifestyle and constantly on her feet.  Has supportive shoes.  Wears compression stockings.  Has tried heating pad which seems to help relieve pain.  Has not use any other conservative measurements.  Has tried dill pickle juice which also has helped with leg cramps.  Hyperlipidemia Currently on Zocor 40 mg daily and Zetia 10 mg daily.  Tolerating medications well.  No myalgias.  Requesting refills  CAD Currently on statin, Zetia, ASA, isosorbide mononitrate.  Denies any chest pain, shortness of breath or heart palpitations.  Followed by cardiology.  Hypertension Asymptomatic.  Currently on propranolol 40 mg daily and tolerating well.  Denies any headaches, visual changes, dizziness or weakness.  No chest pain, shortness of breath or lower extremity edema.  Remains very active.  Requesting refills for propranolol   History of gallstones. Asymptomatic.  Takes Ursodiol 250 mg 3 times daily.  Previously followed by GI at [facility] for dilation of pancreatic duct and history of bypass gastrojejunostomy.  Mood disorder Asymptomatic.  Denies any SI/HI.  Currently on Effexor 75 mg in a.m. and 37.5 mg p.m. tolerating medication well.   PERTINENT PMH / PSH: CAD   OBJECTIVE:  BP 110/70   Pulse (!) 58   Temp 98.4 F (36.9 C) (Oral)   Ht 5' 4"$  (1.626 m)   Wt 123 lb 6.4 oz (56 kg)   SpO2 98%   BMI 21.18 kg/m    Physical Exam Vitals reviewed.  Constitutional:      General: She is not in acute distress.    Appearance: She is not ill-appearing.  HENT:     Head: Normocephalic.     Right Ear: Tympanic membrane, ear canal and external ear normal.     Left Ear: Tympanic  membrane, ear canal and external ear normal.     Nose: Nose normal.  Eyes:     Conjunctiva/sclera: Conjunctivae normal.  Neck:     Thyroid: No thyromegaly or thyroid tenderness.  Cardiovascular:     Rate and Rhythm: Normal rate and regular rhythm.     Heart sounds: Normal heart sounds.  Pulmonary:     Effort: Pulmonary effort is normal.     Breath sounds: Normal breath sounds.  Abdominal:     General: Abdomen is flat. Bowel sounds are normal.     Palpations: Abdomen is soft.  Musculoskeletal:        General: Normal range of motion.     Cervical back: Normal range of motion.  Neurological:     Mental Status: She is alert and oriented to person, place, and time. Mental status is at baseline.  Psychiatric:        Mood and Affect: Mood normal.        Behavior: Behavior normal.        Thought Content: Thought content normal.        Judgment: Judgment normal.       10/30/2022    1:11 PM 03/29/2022    9:33 AM 02/15/2022    4:33 PM 12/13/2021    3:36 PM 07/27/2021    9:26 AM  Depression screen PHQ 2/9  Decreased Interest 0 0 0 0  0  Down, Depressed, Hopeless 0 0 0 0 0  PHQ - 2 Score 0 0 0 0 0  Altered sleeping   0  0  Tired, decreased energy   0  0  Change in appetite   0  0  Feeling bad or failure about yourself    0  0  Trouble concentrating   0  0  Moving slowly or fidgety/restless   0  0  Suicidal thoughts   0  0  PHQ-9 Score   0  0  Difficult doing work/chores   Not difficult at all  Not difficult at all       10/30/2022    1:11 PM 01/26/2021    9:47 AM 11/24/2019    1:46 PM  GAD 7 : Generalized Anxiety Score  Nervous, Anxious, on Edge 0 0 0  Control/stop worrying 0 0 0  Worry too much - different things 0 0 0  Trouble relaxing 0 0 0  Restless 0 0 0  Easily annoyed or irritable 0 0 0  Afraid - awful might happen 0 0 0  Total GAD 7 Score 0 0 0  Anxiety Difficulty Not difficult at all Not difficult at all Not difficult at all      ASSESSMENT/PLAN:  Aortic  atherosclerosis [facility]) Assessment & Plan: Noted on CT abdomen 06/09/2022.  Currently on statin therapy. Continue simvastatin 40 mg daily Lipids annually  Orders: -     Comprehensive metabolic panel; Future  Hyperlipidemia, unspecified hyperlipidemia type Assessment & Plan: Chronic. Tolerating statin and Zetia.  Denies any myalgias. Refill Zetia 10 mg daily Refill Zocor 40 mg daily Fasting lipids at next visit  Orders: -     Ezetimibe; TAKE 1 TABLET(10 MG) BY MOUTH DAILY  Dispense: 90 tablet; Refill: 3 -     Simvastatin; TAKE 1 TABLET(40 MG) BY MOUTH DAILY AT 6 PM  Dispense: 90 tablet; Refill: 3  Gallstones  Episode of recurrent major depressive disorder, unspecified depression episode severity ([place])  Coronary artery disease involving native coronary artery of native heart without angina pectoris -     Isosorbide Mononitrate ER; Take 0.5 tablets (15 mg total) by mouth daily.  Dispense: 30 tablet; Refill: 0 -     Propranolol HCl; Take 1 tablet (40 mg total) by mouth 2 (two) times daily.  Dispense: 180 tablet; Refill: 3  Mood disorder (HCC) Assessment & Plan: Chronic.  Stable.  Denies SI/HI.  Long-term use of Effexor and tolerating well. Refill Effexor 75 mg every morning and 37.5 mg p.m.   Orders: -     TSH; Future -     Venlafaxine HCl; TAKE 2 TABLETS BY MOUTH EVERY MORNING AND 1 TABLET EVERY EVENING  Dispense: 270 tablet; Refill: 3  Vitamin B12 deficiency -     Vitamin B12; Future  Osteoporosis, unspecified osteoporosis type, unspecified pathological fracture presence Assessment & Plan: Currently on Prolia 60 mg every 6 monthly.  Reports DEXA up-to-date. Managed by OB/GYN   Orders: -     VITAMIN D 25 Hydroxy (Vit-D Deficiency, Fractures); Future  Hypertension, unspecified type Assessment & Plan: Chronic.  Stable. Continue propranolol 40 mg daily    History of gallstones Assessment & Plan: Chronic.  Stable.  Takes ursodiol Refill ursodiol to 250 mg 3 times  daily  Orders: -     Ursodiol; Take one three times a day  Dispense: 270 tablet; Refill: 3  Leg cramps Assessment & Plan: Chronic.  Likely secondary to increased repetitive  use.  Resolves with heat.  Recommend continued heat at night. Offered physical therapy.  Patient politely declined, son has exercises that is working on she will try as well. Recommend calf stretching Can take Tylenol 325-500 mg every 6 hours as needed If no improvement or continues to worsen follow-up with PCP.   Other iron deficiency anemia Assessment & Plan: Not taking iron supplements. Check CBC today  Orders: -     CBC with Differential/Platelet; Future  Plantar fasciitis Assessment & Plan: Likely contributing to chronic leg spasms Take tylenol and/or aleve as needed for pain  Plantar fascia stretch for 20-30 seconds (do 3 of these) in morning Lowering/raise on a step exercises 3 x 10 once or twice a day - this is very important for long term recovery. Can add heel walks, toe walks forward and backward as well Ice heel for 15 minutes as needed. Avoid flat shoes/barefoot walking as much as possible. Arch straps have been shown to help with pain. Inserts are important (dr. Zoe Lan active series, spencos, our green insoles, custom orthotics). Steroid injection is a consideration for short term pain relief if you are struggling. Physical therapy is also an option.    Abnormal glucose -     Hemoglobin A1c; Future    HCM Colonoscopy up-to-date.  Dr. Mont Dutton [facility] normal no bxs f/u in 5 years. Due for 05/2023.  Patient to call GI office to schedule appointment.  If referral needed I be happy to place. Mammogram due 08/24.  Referral sent Recommend shingles vaccine Recommend tetanus vaccine Recommend flu vaccine Recommend scheduling Medicare annual wellness visit, Tuesdays and Thursdays afternoons.  PDMP reviewed  Return in about 2 months (around 12/29/2022).  Carollee Leitz, MD

## 2022-10-30 NOTE — Assessment & Plan Note (Signed)
Noted on CT abdomen 06/09/2022.  Currently on statin therapy. Continue simvastatin 40 mg daily Lipids annually

## 2022-11-02 ENCOUNTER — Encounter: Payer: Self-pay | Admitting: Family Medicine

## 2022-11-02 DIAGNOSIS — F39 Unspecified mood [affective] disorder: Secondary | ICD-10-CM | POA: Insufficient documentation

## 2022-11-02 DIAGNOSIS — I2510 Atherosclerotic heart disease of native coronary artery without angina pectoris: Secondary | ICD-10-CM | POA: Insufficient documentation

## 2022-11-02 DIAGNOSIS — R7309 Other abnormal glucose: Secondary | ICD-10-CM | POA: Insufficient documentation

## 2022-11-02 DIAGNOSIS — Z8719 Personal history of other diseases of the digestive system: Secondary | ICD-10-CM | POA: Insufficient documentation

## 2022-11-02 NOTE — Assessment & Plan Note (Signed)
Chronic.  Stable.  Denies SI/HI.  Long-term use of Effexor and tolerating well. Refill Effexor 75 mg every morning and 37.5 mg p.m.

## 2022-11-02 NOTE — Assessment & Plan Note (Signed)
Chronic. Tolerating statin and Zetia.  Denies any myalgias. Refill Zetia 10 mg daily Refill Zocor 40 mg daily Fasting lipids at next visit

## 2022-11-02 NOTE — Assessment & Plan Note (Signed)
Chronic.  Likely secondary to increased repetitive use.  Resolves with heat.  Recommend continued heat at night. Offered physical therapy.  Patient politely declined, son has exercises that is working on she will try as well. Recommend calf stretching Can take Tylenol 325-500 mg every 6 hours as needed If no improvement or continues to worsen follow-up with PCP.

## 2022-11-02 NOTE — Assessment & Plan Note (Signed)
Likely contributing to chronic leg spasms Take tylenol and/or aleve as needed for pain  Plantar fascia stretch for 20-30 seconds (do 3 of these) in morning Lowering/raise on a step exercises 3 x 10 once or twice a day - this is very important for long term recovery. Can add heel walks, toe walks forward and backward as well Ice heel for 15 minutes as needed. Avoid flat shoes/barefoot walking as much as possible. Arch straps have been shown to help with pain. Inserts are important (dr. Zoe Lan active series, spencos, our green insoles, custom orthotics). Steroid injection is a consideration for short term pain relief if you are struggling. Physical therapy is also an option.

## 2022-11-02 NOTE — Assessment & Plan Note (Addendum)
Not taking iron supplements. Check CBC today

## 2022-11-02 NOTE — Assessment & Plan Note (Signed)
Chronic.  Stable.  Takes ursodiol Refill ursodiol to 250 mg 3 times daily

## 2022-11-02 NOTE — Assessment & Plan Note (Signed)
Currently on Prolia 60 mg every 6 monthly.  Reports DEXA up-to-date. Managed by OB/GYN

## 2022-11-02 NOTE — Assessment & Plan Note (Signed)
Chronic.  Stable. Continue propranolol 40 mg daily

## 2022-11-25 ENCOUNTER — Telehealth: Payer: Self-pay | Admitting: Cardiovascular Disease

## 2022-11-25 NOTE — Telephone Encounter (Signed)
Returned the call to the patient. 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 She stated that this is happening every few weeks and it may last 1-2 hours. She denied chest pain and shortness of breath. She stated that this usually only happens when she is exerting herself. She does stay hydrated.   Her blood pressure and heart rate when she got home was 147/60 and 56.  She has an appointment this Thursday.   Patient made aware of ED precautions should new or worsening symptoms occur. Patient verbalized understanding.

## 2022-11-25 NOTE — Telephone Encounter (Signed)
Pt c/o Syncope: STAT if syncope occurred within 30 minutes and pt complains of lightheadedness High Priority if episode of passing out, completely, today or in last 24 hours   Did you pass out today?   No   When is the last time you passed out?   Yes   Has this occurred multiple times?   Yes   Did you have any symptoms prior to passing out? Cold sweat, a little headache and dizziness  Patient stated she broke out in a cold sweat and felt like she was going to pass out and the last couple of days patient states she has been feeling dizzy which is unusual for her.

## 2022-11-26 NOTE — Telephone Encounter (Signed)
Thank you for the update.  Will assess at office visit.  Agree with ED precautions.

## 2022-11-27 NOTE — Progress Notes (Signed)
 " Cardiology Office Note:    Date:  11/28/2022   ID:  Katie [name], DOB August 18, 1946, MRN [id number]  PCP:  Hope Merle, MD   [facility] Providers Cardiologist:  Deatrice Cage, MD     Referring MD: Hope Merle, MD   Chief Complaint  Patient presents with   Follow-up    Dizziness, HTN home readings    History of Present Illness:    Katie [name] is a 77 y.o. female with a hx of HTN, CAD (significant non focal stenosis in the mid LAD. FFR ct 0.78), GERD, neoplasm of the bladder, HLD, h/o gastric bypass, mild > mod MR.  She was evaluated by Dr. Cage in December 2021, for pain near her left breast, pain was not determined to be cardiac in nature.  Most recently she was evaluated in February 2023 for sharp chest pain that was accompanied by diaphoresis, pain was increased with movement. She had an echo on 11/16/21 which revealed EF of 60-65%, grade I DD, mild > mod MR. She has a coronary CTA in 2023 which showed a calcium score of 32.7, FFR of the mid LAD 0.78. She saw Dr. Darliss after this and Imdur  was started for occasional chest discomfort.   Most recently evaluated in our office on 06/10/22 with Bernardino Bring, PA, at that time she was doing well from a cardiac perspective.   She contacted our triage nurse on 11/25/22 for recent episodes of diaphoresis and near syncope.   She presents today for follow-up of recent episode of near syncope.  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  She suddenly felt like she was going to pass out and became diaphoretic.  She was able to rest, called her husband who picked her up, they got something to eat and she felt better.  She has not had any recurrence of episodes since then. She denies chest pain, palpitations, dyspnea, pnd, orthopnea, n, v, dizziness, syncope, edema, weight gain, or early satiety.    Past Medical History:   Diagnosis Date   Acute recurrent sinusitis 02/15/2022   ANEMIA, PERNICIOUS 03/13/2007   Qualifier: Diagnosis of   By: Henrietta LPN, Regina       Anxiety    Anxiety 06/16/2019   Atypical chest pain 09/04/2020   Benign head tremor 11/08/2013   Bile duct stricture    BILE DUCT STRICTURE 04/26/2008   Qualifier: Diagnosis of   By: Vernell LPN, Deborah       Breast mass, left    Breast pain, left 01/02/2021   Calculus of bile duct 04/26/2008   Qualifier: Diagnosis of   By: Vernell LPN, Deborah       Choledocholithiasis    recurrent   Constipation 02/08/2022   COVID-19    09/30/20, 10/07/20    Depression 02/13/1995   Long history of this.    GERD (gastroesophageal reflux disease)    Hypertension    Migraine without aura, without mention of intractable migraine without mention of status migrainosus 01/03/2014   Rapid heart beat    Rotaviral gastroenteritis    12/2020    Past Surgical History:  Procedure Laterality Date   ABDOMINAL HYSTERECTOMY     BLADDER SURGERY     x 2   BREAST EXCISIONAL BIOPSY Right 2003   benign   BREAST LUMPECTOMY  right   BREAST LUMPECTOMY WITH RADIOACTIVE SEED LOCALIZATION Left 03/31/2017   Procedure: LEFT BREAST LUMPECTOMY WITH RADIOACTIVE SEED LOCALIZATION;  Surgeon: Vernetta Berg, MD;  Location: [facility] OR;  Service: General;  Laterality: Left;   CHOLECYSTECTOMY     COLONOSCOPY     CYSTOSCOPY     normal 03/18/22   Explor Lap, Hepatoduodenostomy  07/23/2011   Dr Warden, [facility]   FOOT SURGERY     both feet   GASTRIC BYPASS  05/04/2013   PARTIAL HYSTERECTOMY     SKIN BIOPSY     skin bx RUB benign   TUBAL LIGATION     VEIN LIGATION AND STRIPPING     left leg   WRIST SURGERY     right    Current Medications: Current Meds  Medication Sig   aspirin 81 MG tablet Take 81 mg by mouth daily.   Biotin 1 MG CAPS Take 1 mg by mouth daily.    CHOLECALCIFEROL PO Take by mouth daily.   CYANOCOBALAMIN  IJ Inject as directed every 30 (thirty) days.   denosumab  (PROLIA) 60 MG/ML SOSY injection Inject 60 mg into the skin every 6 (six) months.   esomeprazole  (NEXIUM ) 40 MG capsule Take 1 capsule (40 mg total) by mouth 2 (two) times daily before a meal. 30 minutes before food   ezetimibe  (ZETIA ) 10 MG tablet TAKE 1 TABLET(10 MG) BY MOUTH DAILY   Ferrous Sulfate (IRON PO) Take 1 tablet by mouth every other day. OTC   isosorbide  mononitrate (IMDUR ) 30 MG 24 hr tablet Take 0.5 tablets (15 mg total) by mouth daily.   Multiple Vitamin (MULTI-VITAMINS) TABS Take 1 tablet by mouth daily.    ondansetron  (ZOFRAN ) 4 MG tablet Take 1 tablet (4 mg total) by mouth every 8 (eight) hours as needed for nausea or vomiting.   propranolol  (INDERAL ) 40 MG tablet Take 1 tablet (40 mg total) by mouth 2 (two) times daily.   simvastatin  (ZOCOR ) 40 MG tablet TAKE 1 TABLET(40 MG) BY MOUTH DAILY AT 6 PM   ursodiol  (ACTIGALL ) 250 MG tablet Take one three times a day   venlafaxine  (EFFEXOR ) 37.5 MG tablet TAKE 2 TABLETS BY MOUTH EVERY MORNING AND 1 TABLET EVERY EVENING     Allergies:   Lipitor [atorvastatin calcium]   Social History   Socioeconomic History   Marital status: Married    Spouse name: Not on file   Number of children: 2   Years of education: college   Highest education level: Not on file  Occupational History   Occupation: retired    Associate Professor: RETIRED  Tobacco Use   Smoking status: Never    Passive exposure: Never   Smokeless tobacco: Never  Vaping Use   Vaping Use: Never used  Substance and Sexual Activity   Alcohol use: No   Drug use: No   Sexual activity: Yes  Other Topics Concern   Not on file  Social History Narrative   Does not have a living will or HPOA.   Desires CPR.   Does not want prolonged life support.   Social Determinants of Health   Financial Resource Strain: Low Risk  (12/13/2021)   Overall Financial Resource Strain (CARDIA)    Difficulty of Paying Living Expenses: Not hard at all  Food Insecurity: No Food Insecurity (12/13/2021)    Hunger Vital Sign    Worried About Running Out of Food in the Last Year: Never true    Ran Out of Food in the Last Year:  Never true  Transportation Needs: No Transportation Needs (12/13/2021)   PRAPARE - Administrator, Civil Service (Medical): No    Lack of Transportation (Non-Medical): No  Physical Activity: Sufficiently Active (12/13/2021)   Exercise Vital Sign    Days of Exercise per Week: 5 days    Minutes of Exercise per Session: 50 min  Stress: No Stress Concern Present (12/13/2021)   [employer] of Occupational Health - Occupational Stress Questionnaire    Feeling of Stress : Not at all  Social Connections: Unknown (12/13/2021)   Social Connection and Isolation Panel [NHANES]    Frequency of Communication with Friends and Family: More than three times a week    Frequency of Social Gatherings with Friends and Family: More than three times a week    Attends Religious Services: Not on Marketing Executive or Organizations: Not on file    Attends Banker Meetings: Not on file    Marital Status: Married     Family History: The patient's family history includes Alcohol abuse in her father; Anxiety disorder in her sister; Cancer in her mother; Depression in her sister; GER disease in her brother and sister; Heart attack in her sister; Hypertension in her sister. There is no history of Migraines or Breast cancer.  ROS:   Please see the history of present illness.     All other systems reviewed and are negative.  EKGs/Labs/Other Studies Reviewed:    The following studies were reviewed today:  EKG:  EKG is ordered today.  The ekg ordered today demonstrates sinus bradycardia, HR 50 bpm.   Recent Labs: 03/29/2022: BUN 13; Creatinine, Ser 0.69; Potassium 4.6; Sodium 136 06/10/2022: ALT 13 11/28/2022: Hemoglobin 11.4; Platelets 214  Recent Lipid Panel    Component Value Date/Time   CHOL 191 06/10/2022 1615   CHOL 186 11/16/2021 0900   TRIG  190 (H) 06/10/2022 1615   HDL 67 06/10/2022 1615   HDL 64 11/16/2021 0900   CHOLHDL 2.9 06/10/2022 1615   VLDL 38 06/10/2022 1615   LDLCALC 86 06/10/2022 1615   LDLCALC 101 (H) 11/16/2021 0900   LDLCALC 111 (H) 06/23/2020 1523   LDLDIRECT 88 06/10/2022 1615     Risk Assessment/Calculations:                Physical Exam:    VS:  BP 104/66   Pulse (!) 50   Ht 5' 4 (1.626 m)   Wt 123 lb 6.4 oz (56 kg)   SpO2 98%   BMI 21.18 kg/m     Wt Readings from Last 3 Encounters:  11/28/22 123 lb 6.4 oz (56 kg)  10/30/22 123 lb 6.4 oz (56 kg)  06/10/22 121 lb (54.9 kg)     GEN: appears younger than stated age,  Well nourished, well developed in no acute distress HEENT: Normal NECK: No JVD; No carotid bruits LYMPHATICS: No lymphadenopathy CARDIAC: RRR, no murmurs, rubs, gallops RESPIRATORY:  Clear to auscultation without rales, wheezing or rhonchi  ABDOMEN: Soft, non-tender, non-distended MUSCULOSKELETAL:  No edema; No deformity  SKIN: Warm and dry NEUROLOGIC:  Alert and oriented x 3 PSYCHIATRIC:  Normal affect   ASSESSMENT:    1. Coronary artery disease involving native coronary artery of native heart without angina pectoris   2. Hyperlipidemia LDL goal <70   3. Essential hypertension   4. Chest pain of uncertain etiology   5. Mitral valve insufficiency, unspecified etiology   6. Near syncope  PLAN:    In order of problems listed above:   Near syncope - this occurred in the setting of decreased PO intake recently, sounds to be a hypoglycemic event. Encouraged increased oral hydration and small frequent snacks in light of her past gastric bypass history. Will repeat CBC and BMET today.   CAD - coronary CTA in 2023 which showed a calcium score of 32.7, FFR of the mid LAD 0.78, continue ASA, Imdur , propranolol , Zetia , Zocor . Stable with no anginal symptoms. No indication for ischemic evaluation.    HLD - LDL on 06/10/22 was 86, continue Zetia , Zocor , will repeat FLP  today and LFT.   HTN - BP today 104/66, well controlled, continue current antihypertensive medication regimen  Mitral regurgitation - mild > mod on echo 11/2021, no indication for repeat imaging at this time.   Disposition - CBC, BMET, FLP, LFT. Already has recall scheduled and would like to keep that appt.        Medication Adjustments/Labs and Tests Ordered: Current medicines are reviewed at length with the patient today.  Concerns regarding medicines are outlined above.  Orders Placed This Encounter  Procedures   Lipid panel   Direct LDL   CBC   Basic metabolic panel   EKG 12-Lead   No orders of the defined types were placed in this encounter.   Patient Instructions  Medication Instructions:  No changes at this time.   *If you need a refill on your cardiac medications before your next appointment, please call your pharmacy*   Lab Work: Lipid, Direct LDL, CBC, & BMET to be done today. Go over to the [facility] entrance and check in at registration.   If you have labs (blood work) drawn today and your tests are completely normal, you will receive your results only by: MyChart Message (if you have MyChart) OR A paper copy in the mail If you have any lab test that is abnormal or we need to change your treatment, we will call you to review the results.   Testing/Procedures: None   Follow-Up: At [facility], you and your health needs are our priority.  As part of our continuing mission to provide you with exceptional heart care, we have created designated Provider Care Teams.  These Care Teams include your primary Cardiologist (physician) and Advanced Practice Providers (APPs -  Physician Assistants and Nurse Practitioners) who all work together to provide you with the care you need, when you need it.   Your next appointment:   Keep current Recall for June     Signed, Ocean Kearley C Christophor Eick, NP  11/28/2022 12:47 PM    [facility] "

## 2022-11-28 ENCOUNTER — Ambulatory Visit: Payer: Medicare PPO | Attending: Physician Assistant | Admitting: Cardiology

## 2022-11-28 ENCOUNTER — Other Ambulatory Visit
Admission: RE | Admit: 2022-11-28 | Discharge: 2022-11-28 | Disposition: A | Discharge status: Home / Self Care | Payer: Medicare PPO | Source: Ambulatory Visit | Attending: Cardiology | Admitting: Cardiology

## 2022-11-28 ENCOUNTER — Encounter: Payer: Self-pay | Admitting: Physician Assistant

## 2022-11-28 VITALS — BP 104/66 | HR 50 | Ht 64.0 in | Wt 123.4 lb

## 2022-11-28 DIAGNOSIS — R079 Chest pain, unspecified: Secondary | ICD-10-CM | POA: Diagnosis present

## 2022-11-28 DIAGNOSIS — I340 Nonrheumatic mitral (valve) insufficiency: Secondary | ICD-10-CM | POA: Insufficient documentation

## 2022-11-28 DIAGNOSIS — R55 Syncope and collapse: Secondary | ICD-10-CM

## 2022-11-28 DIAGNOSIS — I10 Essential (primary) hypertension: Secondary | ICD-10-CM | POA: Diagnosis present

## 2022-11-28 DIAGNOSIS — I2510 Atherosclerotic heart disease of native coronary artery without angina pectoris: Secondary | ICD-10-CM | POA: Diagnosis present

## 2022-11-28 DIAGNOSIS — E785 Hyperlipidemia, unspecified: Secondary | ICD-10-CM | POA: Insufficient documentation

## 2022-11-28 LAB — LIPID PANEL
Cholesterol: 199 mg/dL (ref 0–200)
HDL: 69 mg/dL
LDL Cholesterol: 103 mg/dL — ABNORMAL HIGH (ref 0–99)
Total CHOL/HDL Ratio: 2.9 ratio
Triglycerides: 136 mg/dL
VLDL: 27 mg/dL (ref 0–40)

## 2022-11-28 LAB — CBC
HCT: 33.8 % — ABNORMAL LOW (ref 36.0–46.0)
Hemoglobin: 11.4 g/dL — ABNORMAL LOW (ref 12.0–15.0)
MCH: 31.4 pg (ref 26.0–34.0)
MCHC: 33.7 g/dL (ref 30.0–36.0)
MCV: 93.1 fL (ref 80.0–100.0)
Platelets: 214 K/uL (ref 150–400)
RBC: 3.63 MIL/uL — ABNORMAL LOW (ref 3.87–5.11)
RDW: 12.2 % (ref 11.5–15.5)
WBC: 5.4 K/uL (ref 4.0–10.5)
nRBC: 0 % (ref 0.0–0.2)

## 2022-11-28 LAB — BASIC METABOLIC PANEL WITH GFR
Anion gap: 8 (ref 5–15)
BUN: 16 mg/dL (ref 8–23)
CO2: 25 mmol/L (ref 22–32)
Calcium: 8.2 mg/dL — ABNORMAL LOW (ref 8.9–10.3)
Chloride: 103 mmol/L (ref 98–111)
Creatinine, Ser: 0.69 mg/dL (ref 0.44–1.00)
GFR, Estimated: >60 mL/min
Glucose, Bld: 84 mg/dL (ref 70–99)
Potassium: 4 mmol/L (ref 3.5–5.1)
Sodium: 136 mmol/L (ref 135–145)

## 2022-11-28 LAB — LDL CHOLESTEROL, DIRECT: Direct LDL: 116 mg/dL — ABNORMAL HIGH (ref 0–99)

## 2022-11-28 NOTE — Patient Instructions (Signed)
Medication Instructions:  No changes at this time.   *If you need a refill on your cardiac medications before your next appointment, please call your pharmacy*   Lab Work: Lipid, Direct LDL, CBC, & BMET to be done today. Go over to the [facility] entrance and check in at registration.   If you have labs (blood work) drawn today and your tests are completely normal, you will receive your results only by: [place] (if you have MyChart) OR A paper copy in the mail If you have any lab test that is abnormal or we need to change your treatment, we will call you to review the results.   Testing/Procedures: None   Follow-Up: At [facility], you and your health needs are our priority.  As part of our continuing mission to provide you with exceptional heart care, we have created designated Provider Care Teams.  These Care Teams include your primary Cardiologist (physician) and Advanced Practice Providers (APPs -  Physician Assistants and Nurse Practitioners) who all work together to provide you with the care you need, when you need it.   Your next appointment:   Keep current Recall for June

## 2022-12-04 ENCOUNTER — Telehealth: Payer: Self-pay | Admitting: Family Medicine

## 2022-12-04 NOTE — Telephone Encounter (Signed)
Pt called in staying that she tested positive for Covid on Monday, went to UC, and they only prescribed her cough med. And she was wondering if Dr. Volanda Napoleon can send med Paxlovid over @ [place]??

## 2022-12-04 NOTE — Telephone Encounter (Signed)
I called and spoke with the patient and informed her that since she saw UC for this problem we cannot write medications for her but I did inform her to call the UC she went to and let them know that she wants the Paxlovid for her symptoms and she understood and stated she would call them.  Beryl Balz,cma

## 2022-12-19 ENCOUNTER — Encounter: Payer: Self-pay | Admitting: *Deleted

## 2022-12-19 ENCOUNTER — Ambulatory Visit (INDEPENDENT_AMBULATORY_CARE_PROVIDER_SITE_OTHER): Payer: Medicare PPO | Admitting: *Deleted

## 2022-12-19 VITALS — BP 104/66 | Ht 64.0 in | Wt 123.4 lb

## 2022-12-19 DIAGNOSIS — Z0000 Encounter for general adult medical examination without abnormal findings: Secondary | ICD-10-CM

## 2022-12-19 NOTE — Patient Instructions (Signed)
  Ms. Garbin , Thank you for taking time to come for your Medicare Wellness Visit. I appreciate your ongoing commitment to your health goals. Please review the following plan we discussed and let me know if I can assist you in the future.   These are the goals we discussed:  Goals      Follow up with Primary Care Provider     As needed.        This is a list of the screening recommended for you and due dates:  Health Maintenance  Topic Date Due   Zoster (Shingles) Vaccine (1 of 2) Never done   DTaP/Tdap/Td vaccine (2 - Tdap) 12/09/2018   COVID-19 Vaccine (4 - 2023-24 season) 05/17/2022   Flu Shot  04/17/2023   Mammogram  05/11/2023   Colon Cancer Screening  05/21/2023   Medicare Annual Wellness Visit  12/19/2023   Pneumonia Vaccine  Completed   DEXA scan (bone density measurement)  Completed   Hepatitis C Screening: USPSTF Recommendation to screen - Ages 26-79 yo.  Completed   HPV Vaccine  Aged Out

## 2022-12-19 NOTE — Progress Notes (Signed)
I connected with  Konrad Dolores on 12/19/22 by a audio enabled telemedicine application and verified that I am speaking with the correct person using two identifiers.  Patient Location: Home  Provider Location: Office/Clinic  I discussed the limitations of evaluation and management by telemedicine. The patient expressed understanding and agreed to proceed.   Subjective:   Katie [name] is a 77 y.o. female who presents for Medicare Annual (Subsequent) preventive examination.  Cardiac Risk Factors include: none     Objective:    Today's Vitals   12/19/22 1541  BP: 104/66  Weight: 123 lb 6.4 oz (56 kg)  Height: 5\' 4"  (1.626 m)   Body mass index is 21.18 kg/m.     12/19/2022    3:59 PM 12/13/2021    3:39 PM 05/05/2020    9:45 AM 03/31/2017    9:19 AM 04/19/2016    9:34 AM 02/03/2015   10:47 AM  Advanced Directives  Does Patient Have a Medical Advance Directive? No No  No No No  Does patient want to make changes to medical advance directive?   No - Patient declined     Would patient like information on creating a medical advance directive? Yes (ED - Information included in AVS) No - Patient declined  No - Patient declined Yes - Educational materials given Yes - Educational materials given    Current Medications (verified) Outpatient Encounter Medications as of 12/19/2022  Medication Sig   Biotin 1 MG CAPS Take 1 mg by mouth daily.    CHOLECALCIFEROL PO Take by mouth daily.   denosumab (PROLIA) 60 MG/ML SOSY injection Inject 60 mg into the skin every 6 (six) months.   esomeprazole (NEXIUM) 40 MG capsule Take 1 capsule (40 mg total) by mouth 2 (two) times daily before a meal. 30 minutes before food   ezetimibe (ZETIA) 10 MG tablet TAKE 1 TABLET(10 MG) BY MOUTH DAILY   Ferrous Sulfate (IRON PO) Take 1 tablet by mouth every other day. OTC   isosorbide mononitrate (IMDUR) 30 MG 24 hr tablet Take 0.5 tablets (15 mg total) by mouth daily.   Multiple Vitamins-Minerals (PRESERVISION AREDS)  CAPS Take by mouth 2 (two) times daily.   ondansetron (ZOFRAN) 4 MG tablet Take 1 tablet (4 mg total) by mouth every 8 (eight) hours as needed for nausea or vomiting.   propranolol (INDERAL) 40 MG tablet Take 1 tablet (40 mg total) by mouth 2 (two) times daily.   simvastatin (ZOCOR) 40 MG tablet TAKE 1 TABLET(40 MG) BY MOUTH DAILY AT 6 PM   ursodiol (ACTIGALL) 250 MG tablet Take one three times a day   venlafaxine (EFFEXOR) 37.5 MG tablet TAKE 2 TABLETS BY MOUTH EVERY MORNING AND 1 TABLET EVERY EVENING   aspirin 81 MG tablet Take 81 mg by mouth daily. (Patient not taking: Reported on 12/19/2022)   CYANOCOBALAMIN IJ Inject as directed every 30 (thirty) days. (Patient not taking: Reported on 12/19/2022)   Multiple Vitamin (MULTI-VITAMINS) TABS Take 1 tablet by mouth daily.  (Patient not taking: Reported on 12/19/2022)   No facility-administered encounter medications on file as of 12/19/2022.    Allergies (verified) Lipitor [atorvastatin calcium]   History: Past Medical History:  Diagnosis Date   Acute recurrent sinusitis 02/15/2022   ANEMIA, PERNICIOUS 03/13/2007   Qualifier: Diagnosis of   By: Lurlean Nanny LPN, Regina       Anxiety    Anxiety 06/16/2019   Atypical chest pain 09/04/2020   Benign head tremor 11/08/2013  Bile duct stricture    BILE DUCT STRICTURE 04/26/2008   Qualifier: Diagnosis of   By: Apolonio Schneiders LPN, Deborah       Breast mass, left    Breast pain, left 01/02/2021   Calculus of bile duct 04/26/2008   Qualifier: Diagnosis of   By: Apolonio Schneiders LPN, Deborah       Choledocholithiasis    recurrent   Constipation 02/08/2022   COVID-19    09/30/20, 10/07/20    Depression 02/13/1995   Long history of this.    GERD (gastroesophageal reflux disease)    Hypertension    Migraine without aura, without mention of intractable migraine without mention of status migrainosus 01/03/2014   Rapid heart beat    Rotaviral gastroenteritis    12/2020   Past Surgical History:  Procedure Laterality Date    ABDOMINAL HYSTERECTOMY     BLADDER SURGERY     x 2   BREAST EXCISIONAL BIOPSY Right 2003   benign   BREAST LUMPECTOMY     right   BREAST LUMPECTOMY WITH RADIOACTIVE SEED LOCALIZATION Left 03/31/2017   Procedure: LEFT BREAST LUMPECTOMY WITH RADIOACTIVE SEED LOCALIZATION;  Surgeon: Coralie Keens, MD;  Location: [facility];  Service: General;  Laterality: Left;   CHOLECYSTECTOMY     COLONOSCOPY     CYSTOSCOPY     normal 03/18/22   Explor Lap, Hepatoduodenostomy  07/23/2011   Dr Lazarus Gowda, [place]     both feet   GASTRIC BYPASS  05/04/2013   PARTIAL HYSTERECTOMY     SKIN BIOPSY     skin bx RUB benign   TUBAL LIGATION     VEIN LIGATION AND STRIPPING     left leg   WRIST SURGERY     right   Family History  Problem Relation Age of Onset   Cancer Mother        lung, smoker   Alcohol abuse Father    GER disease Sister    Heart attack Sister    Depression Sister    Anxiety disorder Sister    Hypertension Sister    GER disease Brother    Migraines Neg Hx    Breast cancer Neg Hx    Social History   Socioeconomic History   Marital status: Married    Spouse name: Not on file   Number of children: 2   Years of education: college   Highest education level: Not on file  Occupational History   Occupation: retired    Fish farm manager: RETIRED  Tobacco Use   Smoking status: Never    Passive exposure: Never   Smokeless tobacco: Never  Vaping Use   Vaping Use: Never used  Substance and Sexual Activity   Alcohol use: No   Drug use: No   Sexual activity: Yes  Other Topics Concern   Not on file  Social History Narrative   Does not have a living will or HPOA.   Desires CPR.   Does not want prolonged life support.   Social Determinants of Health   Financial Resource Strain: Low Risk  (12/13/2021)   Overall Financial Resource Strain (CARDIA)    Difficulty of Paying Living Expenses: Not hard at all  Food Insecurity: No Food Insecurity (12/19/2022)   Hunger Vital Sign     Worried About Running Out of Food in the Last Year: Never true    Ran Out of Food in the Last Year: Never true  Transportation Needs: No Transportation Needs (12/19/2022)   PRAPARE -  Hydrologist (Medical): No    Lack of Transportation (Non-Medical): No  Physical Activity: Insufficiently Active (12/19/2022)   Exercise Vital Sign    Days of Exercise per Week: 3 days    Minutes of Exercise per Session: 10 min  Stress: No Stress Concern Present (12/13/2021)   [place]    Feeling of Stress : Not at all  Social Connections: [place]ann[place] (12/19/2022)   Social Connection and Isolation Panel [NHANES]    Frequency of Communication with Friends and Family: Not on file    Frequency of Social Gatherings with Friends and Family: More than three times a week    Attends Religious Services: More than 4 times per year    Active Member of [employer] or Organizations: Yes    Attends Music therapist: More than 4 times per year    Marital Status: Married    Tobacco Counseling Counseling given: Not Answered   Clinical Intake:  Pre-visit preparation completed: Yes        BMI - recorded: 21.17 Nutritional Status: BMI of 19-24  Normal Nutritional Risks: None Diabetes: No  How often do you need to have someone help you when you read instructions, pamphlets, or other written materials from your doctor or pharmacy?: 1 - Never  Diabetic? No         Activities of Daily Living    12/19/2022    3:56 PM  In your present state of health, do you have any difficulty performing the following activities:  Hearing? 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 1  Comment Vision is changing, plans to see eye doctor soon. Wears glasses  Difficulty concentrating or making decisions? 0  Walking or climbing stairs? 0  Dressing or bathing? 0  Doing errands, shopping? 0  Preparing Food and eating ? N  Using  the Toilet? N  In the past six months, have you accidently leaked urine? N  Do you have problems with loss of bowel control? N  Managing your Medications? N  Managing your Finances? N  Housekeeping or managing your Housekeeping? N    Patient Care Team: Carollee Leitz, MD as PCP - General (Family Medicine) Wellington Hampshire, MD as PCP - Cardiology (Cardiology) Kathrynn Ducking, MD (Inactive) as Consulting Physician (Neurology) Rolm Bookbinder, MD as Consulting Physician (Dermatology) Molli Posey, MD as Consulting Physician (Obstetrics and Gynecology) Thelma Comp, [place] as Consulting Physician (Optometry)  Indicate any recent Medical Services you may have received from other than [facility] providers in the past year (date may be approximate).     Assessment:   This is a routine wellness examination for Morine.  Hearing/Vision screen No results found.  Dietary issues and exercise activities discussed: Current Exercise Habits: The patient does not participate in regular exercise at present, Type of exercise: walking, Time (Minutes): 15, Frequency (Times/Week): 3, Weekly Exercise (Minutes/Week): 45, Intensity: Mild, Exercise limited by: None identified   Goals Addressed   None    Depression Screen    12/19/2022    3:53 PM 10/30/2022    1:11 PM 03/29/2022    9:33 AM 02/15/2022    4:33 PM 12/13/2021    3:36 PM 07/27/2021    9:26 AM 01/26/2021    9:46 AM  PHQ 2/9 Scores  PHQ - 2 Score 0 0 0 0 0 0 0  PHQ- 9 Score    0  0 0  Fall Risk    12/19/2022    3:53 PM 10/30/2022    1:11 PM 03/29/2022    9:33 AM 02/15/2022    4:33 PM 12/13/2021    3:47 PM  Fall Risk   Falls in the past year? 0 0 0 0 0  Number falls in past yr: 0 0   0  Injury with Fall? 0 0     Risk for fall due to : No Fall Risks No Fall Risks  No Fall Risks   Follow up  Falls evaluation completed  Falls evaluation completed Falls evaluation completed    [place]:  Any stairs in or  around the home? No  If so, are there any without handrails? No  Home free of loose throw rugs in walkways, pet beds, electrical cords, etc? Yes  Adequate lighting in your home to reduce risk of falls? Yes   ASSISTIVE DEVICES UTILIZED TO PREVENT FALLS:  Life alert? No  Use of a cane, walker or w/c? No  Grab bars in the bathroom? No  Shower chair or bench in shower? Yes  Elevated toilet seat or a handicapped toilet? No    Cognitive Function:    04/19/2016    9:38 AM  MMSE - Mini Mental State Exam  Orientation to time 5  Orientation to Place 5  Registration 3  Attention/ Calculation 0  Recall 3  Language- name 2 objects 0  Language- repeat 1  Language- follow 3 step command 3  Language- read & follow direction 0  Write a sentence 0  Copy design 0  Total score 20        12/19/2022    3:58 PM 05/05/2020    9:44 AM  6CIT Screen  What Year?  0 points  What month?  0 points  What time? 0 points 0 points  Count back from 20 0 points   Months in reverse 0 points   Repeat phrase 0 points     Immunizations Immunization History  Administered Date(s) Administered   Fluad Quad(high Dose 65+) 07/27/2021   Influenza, High Dose Seasonal PF 06/16/2020   Influenza,inj,Quad PF,6+ Mos 08/02/2015   Influenza-Unspecified 06/24/2014, 06/17/2019, 06/16/2022   Moderna Sars-Covid-2 Vaccination 09/27/2019, 10/25/2019, 08/30/2020   Pneumococcal Conjugate-13 08/24/2014   Pneumococcal Polysaccharide-23 04/19/2016   Td 12/08/2008   Zoster, Live 07/19/2015    TDAP status: Up to date  Flu Vaccine status: Up to date  Pneumococcal vaccine status: Up to date  Covid-19 vaccine status: Information provided on how to obtain vaccines.   Qualifies for Shingles Vaccine? No   Zostavax completed No   Shingrix Completed?: No.    Education has been provided regarding the importance of this vaccine. Patient has been advised to call insurance company to determine out of pocket expense if they have  not yet received this vaccine. Advised may also receive vaccine at local pharmacy or Health Dept. Verbalized acceptance and understanding.  Screening Tests Health Maintenance  Topic Date Due   Zoster Vaccines- Shingrix (1 of 2) Never done   DTaP/Tdap/Td (2 - Tdap) 12/09/2018   COVID-19 Vaccine (4 - 2023-24 season) 05/17/2022   INFLUENZA VACCINE  04/17/2023   MAMMOGRAM  05/11/2023   COLONOSCOPY (Pts 45-4yrs Insurance coverage will need to be confirmed)  05/21/2023   Medicare Annual Wellness (AWV)  12/19/2023   Pneumonia Vaccine 29+ Years old  Completed   DEXA SCAN  Completed   Hepatitis C Screening  Completed  HPV VACCINES  Aged Out    Health Maintenance  Health Maintenance Due  Topic Date Due   Zoster Vaccines- Shingrix (1 of 2) Never done   DTaP/Tdap/Td (2 - Tdap) 12/09/2018   COVID-19 Vaccine (4 - 2023-24 season) 05/17/2022    Colorectal cancer screening: Type of screening: Colonoscopy. Completed 05/20/2018. Repeat every 5 years  Mammogram status: Ordered on 11/02/2022. Pt provided with contact info and advised to call to schedule appt.   Bone Density last performed on 02/15/2019, results not in health maintenance.  Lung Cancer Screening: (Low Dose CT Chest recommended if Age 50-80 years, 30 pack-year currently smoking OR have quit w/in 15years.) does not qualify.     Additional Screening:  Hepatitis C Screening: does qualify; Completed 04/19/2016  Vision Screening: Recommended annual ophthalmology exams for early detection of glaucoma and other disorders of the eye. Is the patient up to date with their annual eye exam?  No  Who is the provider or what is the name of the office in which the patient attends annual eye exams? [facility] If pt is not established with a provider, would they like to be referred to a provider to establish care? No .   Dental Screening: Recommended annual dental exams for proper oral hygiene  Community Resource Referral / Chronic Care  Management: CRR required this visit?  No   CCM required this visit?  No      Plan:     I have personally reviewed and noted the following in the patient's chart:   Medical and social history Use of alcohol, tobacco or illicit drugs  Current medications and supplements including opioid prescriptions. Patient is not currently taking opioid prescriptions. Functional ability and status Nutritional status Physical activity Advanced directives List of other physicians Hospitalizations, surgeries, and ER visits in previous 12 months Vitals Screenings to include cognitive, depression, and falls Referrals and appointments  In addition, I have reviewed and discussed with patient certain preventive protocols, quality metrics, and best practice recommendations. A written personalized care plan for preventive services as well as general preventive health recommendations were provided to patient.     Cannon Kettle, [place]   12/19/2022   Nurse Notes: Total time spent on telephone visit today was 13 minutes.

## 2022-12-26 ENCOUNTER — Other Ambulatory Visit (INDEPENDENT_AMBULATORY_CARE_PROVIDER_SITE_OTHER): Payer: Medicare PPO

## 2022-12-26 DIAGNOSIS — F39 Unspecified mood [affective] disorder: Secondary | ICD-10-CM | POA: Diagnosis not present

## 2022-12-26 DIAGNOSIS — R7309 Other abnormal glucose: Secondary | ICD-10-CM

## 2022-12-26 DIAGNOSIS — D508 Other iron deficiency anemias: Secondary | ICD-10-CM | POA: Diagnosis not present

## 2022-12-26 DIAGNOSIS — M810 Age-related osteoporosis without current pathological fracture: Secondary | ICD-10-CM

## 2022-12-26 DIAGNOSIS — E538 Deficiency of other specified B group vitamins: Secondary | ICD-10-CM

## 2022-12-26 DIAGNOSIS — I700 Atherosclerosis of aorta: Secondary | ICD-10-CM | POA: Diagnosis not present

## 2022-12-26 LAB — COMPREHENSIVE METABOLIC PANEL
ALT: 13 U/L (ref 0–35)
AST: 11 U/L (ref 0–37)
Albumin: 3.8 g/dL (ref 3.5–5.2)
Alkaline Phosphatase: 52 U/L (ref 39–117)
BUN: 12 mg/dL (ref 6–23)
CO2: 25 mEq/L (ref 19–32)
Calcium: 8.5 mg/dL (ref 8.4–10.5)
Chloride: 106 mEq/L (ref 96–112)
Creatinine, Ser: 0.73 mg/dL (ref 0.40–1.20)
GFR: 80.37 mL/min (ref >60.00)
Glucose, Bld: 98 mg/dL (ref 70–99)
Potassium: 4 mEq/L (ref 3.5–5.1)
Sodium: 139 mEq/L (ref 135–145)
Total Bilirubin: 0.5 mg/dL (ref 0.2–1.2)
Total Protein: 5.8 g/dL — ABNORMAL LOW (ref 6.0–8.3)

## 2022-12-26 LAB — HEMOGLOBIN A1C: Hgb A1c MFr Bld: 5.6 % (ref 4.6–6.5)

## 2022-12-26 LAB — CBC WITH DIFFERENTIAL/PLATELET
Basophils Absolute: 0 10*3/uL (ref 0.0–0.1)
Basophils Relative: 0.5 % (ref 0.0–3.0)
Eosinophils Absolute: 0.1 10*3/uL (ref 0.0–0.7)
Eosinophils Relative: 3.1 % (ref 0.0–5.0)
HCT: 32.5 % — ABNORMAL LOW (ref 36.0–46.0)
Hemoglobin: 10.9 g/dL — ABNORMAL LOW (ref 12.0–15.0)
Lymphocytes Relative: 20.7 % (ref 12.0–46.0)
Lymphs Abs: 0.9 10*3/uL (ref 0.7–4.0)
MCHC: 33.5 g/dL (ref 30.0–36.0)
MCV: 93.4 fl (ref 78.0–100.0)
Monocytes Absolute: 0.4 10*3/uL (ref 0.1–1.0)
Monocytes Relative: 8 % (ref 3.0–12.0)
Neutro Abs: 3 10*3/uL (ref 1.4–7.7)
Neutrophils Relative %: 67.7 % (ref 43.0–77.0)
Platelets: 226 10*3/uL (ref 150.0–400.0)
RBC: 3.48 Mil/uL — ABNORMAL LOW (ref 3.87–5.11)
RDW: 13.6 % (ref 11.5–15.5)
WBC: 4.4 10*3/uL (ref 4.0–10.5)

## 2022-12-26 LAB — TSH: TSH: 7.11 u[IU]/mL — ABNORMAL HIGH (ref 0.35–5.50)

## 2022-12-26 LAB — VITAMIN D 25 HYDROXY (VIT D DEFICIENCY, FRACTURES): VITD: 29.04 ng/mL — ABNORMAL LOW (ref 30.00–100.00)

## 2022-12-26 LAB — VITAMIN B12: Vitamin B-12: 303 pg/mL (ref 211–911)

## 2022-12-29 ENCOUNTER — Other Ambulatory Visit: Payer: Self-pay | Admitting: Family Medicine

## 2022-12-29 DIAGNOSIS — E559 Vitamin D deficiency, unspecified: Secondary | ICD-10-CM

## 2022-12-29 DIAGNOSIS — D649 Anemia, unspecified: Secondary | ICD-10-CM

## 2022-12-29 MED ORDER — VITAMIN D (ERGOCALCIFEROL) 1.25 MG (50000 UNIT) PO CAPS
50000.0000 [IU] | ORAL_CAPSULE | ORAL | 1 refills | Status: DC
Start: 2022-12-29 — End: 2023-05-26

## 2023-01-02 ENCOUNTER — Encounter: Payer: Medicare PPO | Admitting: Family Medicine

## 2023-01-06 DIAGNOSIS — Z6822 Body mass index (BMI) 22.0-22.9, adult: Secondary | ICD-10-CM | POA: Diagnosis not present

## 2023-01-06 DIAGNOSIS — Z01419 Encounter for gynecological examination (general) (routine) without abnormal findings: Secondary | ICD-10-CM | POA: Diagnosis not present

## 2023-01-10 ENCOUNTER — Ambulatory Visit (INDEPENDENT_AMBULATORY_CARE_PROVIDER_SITE_OTHER): Payer: Medicare PPO | Admitting: Family Medicine

## 2023-01-10 VITALS — BP 122/70 | HR 55 | Temp 98.2°F | Resp 16 | Ht 62.0 in | Wt 118.0 lb

## 2023-01-10 DIAGNOSIS — I10 Essential (primary) hypertension: Secondary | ICD-10-CM

## 2023-01-10 DIAGNOSIS — Z0000 Encounter for general adult medical examination without abnormal findings: Secondary | ICD-10-CM

## 2023-01-10 DIAGNOSIS — D509 Iron deficiency anemia, unspecified: Secondary | ICD-10-CM

## 2023-01-10 DIAGNOSIS — R7989 Other specified abnormal findings of blood chemistry: Secondary | ICD-10-CM

## 2023-01-10 DIAGNOSIS — Z1211 Encounter for screening for malignant neoplasm of colon: Secondary | ICD-10-CM

## 2023-01-10 NOTE — Patient Instructions (Addendum)
It was a pleasure meeting you today. Thank you for allowing me to take part in your health care.  Our goals for today as we discussed include:  Colonoscopy due Sept 2024.   Checking Ferritin today.  If normal will call to schedule Colonoscopy earlier.  We will get some labs today.  If they are abnormal or we need to do something about them, I will call you.  If they are normal, I will send you a message on MyChart (if it is active) or a letter in the mail.  If you don't hear from [place] in 2 weeks, please call the office at the number below.    Schedule lab appointment in 2 months for repeat thyroid level.  Follow up with PCP in 6 months or sooner if needed  Recommend Shingles vaccine.  This is a 2 dose series and can be given at your local pharmacy.  Please talk to your pharmacist about this.   Recommend Tetanus Vaccination.  This is given every 10 years.   Mammogram due 04/2023 Referral sent for Mammogram. Please call to schedule appointment. [facility] [address] [place], [place] [address] [phone]    If you have any questions or concerns, please do not hesitate to call the office at [phone].  I look forward to our next visit and until then take care and stay safe.  Regards,   Dana Allan, MD   [facility]

## 2023-01-10 NOTE — Progress Notes (Signed)
SUBJECTIVE:   Chief Complaint  Patient presents with   Annual Exam   HPI Presents to clinic for annual physical exam  No acute concerns today.  Anemia Prescribed ferrous sulfate every other day but not taking.  Denies any rectal bleeding, unintentional weight loss.  Colonoscopy due every 5 years for history of colon polyps.  Due September 2024.  Last ferritin 37.3, low norm.  Has been lower in past.  Elevated TSH Asymptomatic.  Not currently on medication.  No previous history of elevated thyroid function tests.  Osteoporosis Currently takes Prolia 60 mg every 6 monthly.  On vitamin D and calcium supplements.   PERTINENT PMH / PSH: Hypertension Osteoporosis Hyperlipidemia   OBJECTIVE:  BP 122/70   Pulse (!) 55   Temp 98.2 F (36.8 C)   Resp 16   Ht 5\' 2"  (1.575 m)   Wt 118 lb (53.5 kg)   SpO2 98%   BMI 21.58 kg/m    Physical Exam Vitals reviewed.  Constitutional:      General: She is not in acute distress.    Appearance: Normal appearance. She is normal weight. She is not ill-appearing.  HENT:     Head: Normocephalic.     Right Ear: Tympanic membrane, ear canal and external ear normal.     Left Ear: Tympanic membrane, ear canal and external ear normal.     Nose: Nose normal.     Mouth/Throat:     Mouth: Mucous membranes are moist.  Eyes:     Extraocular Movements: Extraocular movements intact.     Conjunctiva/sclera: Conjunctivae normal.     Pupils: Pupils are equal, round, and reactive to light.  Neck:     Thyroid: No thyromegaly or thyroid tenderness.     Vascular: No carotid bruit.  Cardiovascular:     Rate and Rhythm: Normal rate and regular rhythm.     Pulses: Normal pulses.     Heart sounds: Normal heart sounds.  Pulmonary:     Effort: Pulmonary effort is normal.     Breath sounds: Normal breath sounds.  Abdominal:     General: Bowel sounds are normal. There is no distension.     Palpations: Abdomen is soft.     Tenderness: There is no  abdominal tenderness. There is no right CVA tenderness, left CVA tenderness, guarding or rebound.  Musculoskeletal:        General: Normal range of motion.     Cervical back: Normal range of motion.     Right lower leg: No edema.     Left lower leg: No edema.  Lymphadenopathy:     Cervical: No cervical adenopathy.  Skin:    Capillary Refill: Capillary refill takes less than 2 seconds.  Neurological:     General: No focal deficit present.     Mental Status: She is alert and oriented to person, place, and time. Mental status is at baseline.     Motor: No weakness.  Psychiatric:        Mood and Affect: Mood normal.        Behavior: Behavior normal.        Thought Content: Thought content normal.        Judgment: Judgment normal.       01/10/2023   10:05 AM 12/19/2022    3:53 PM 10/30/2022    1:11 PM 03/29/2022    9:33 AM 02/15/2022    4:33 PM  Depression screen PHQ 2/9  Decreased Interest 0 0 0  0 0  Down, Depressed, Hopeless 0 0 0 0 0  PHQ - 2 Score 0 0 0 0 0  Altered sleeping 0    0  Tired, decreased energy 0    0  Change in appetite 0    0  Feeling bad or failure about yourself  0    0  Trouble concentrating 0    0  Moving slowly or fidgety/restless 0    0  Suicidal thoughts 0    0  PHQ-9 Score 0    0  Difficult doing work/chores Not difficult at all    Not difficult at all       10/30/2022    1:11 PM 01/26/2021    9:47 AM 11/24/2019    1:46 PM  GAD 7 : Generalized Anxiety Score  Nervous, Anxious, on Edge 0 0 0  Control/stop worrying 0 0 0  Worry too much - different things 0 0 0  Trouble relaxing 0 0 0  Restless 0 0 0  Easily annoyed or irritable 0 0 0  Afraid - awful might happen 0 0 0  Total GAD 7 Score 0 0 0  Anxiety Difficulty Not difficult at all Not difficult at all Not difficult at all      ASSESSMENT/PLAN:  Annual physical exam Assessment & Plan: No recent falls Depression and anxiety screening negative Declined shingles vaccine Recommend tetanus  booster Mammogram referral sent.  Due 08/24 Colonoscopy due 9/24.  Referral sent. Medicare annual wellness completed Pap smear not indicated No indication for clinical breast exam.  Recommend home self breast examinations regularly   Iron deficiency anemia, unspecified iron deficiency anemia type Assessment & Plan: Chronic.  Not taking iron supplements.  Asymptomatic. Check iron panel today Restart iron supplements Referral sent to GI for colonoscopy.  Orders: -     Iron, TIBC and Ferritin Panel  Elevated TSH Assessment & Plan: New problem.  TSH elevated 7.11.  Asymptomatic.  No thyroid nodules palpable. Recheck TSH in 8 weeks.   Orders: -     TSH; Future  Colon cancer screening -     Ambulatory referral to Gastroenterology   PDMP reviewed  Return in about 6 months (around 07/12/2023).  Dana Allan, MD

## 2023-01-11 LAB — IRON,TIBC AND FERRITIN PANEL
%SAT: 26 % (calc) (ref 16–45)
Ferritin: 33 ng/mL (ref 16–288)
Iron: 83 ug/dL (ref 45–160)
TIBC: 325 mcg/dL (calc) (ref 250–450)

## 2023-01-27 ENCOUNTER — Encounter: Payer: Self-pay | Admitting: Family Medicine

## 2023-01-27 DIAGNOSIS — R7989 Other specified abnormal findings of blood chemistry: Secondary | ICD-10-CM | POA: Insufficient documentation

## 2023-01-27 DIAGNOSIS — Z131 Encounter for screening for diabetes mellitus: Secondary | ICD-10-CM | POA: Insufficient documentation

## 2023-01-27 DIAGNOSIS — Z1211 Encounter for screening for malignant neoplasm of colon: Secondary | ICD-10-CM | POA: Insufficient documentation

## 2023-01-27 DIAGNOSIS — Z0000 Encounter for general adult medical examination without abnormal findings: Secondary | ICD-10-CM | POA: Insufficient documentation

## 2023-01-27 NOTE — Assessment & Plan Note (Signed)
New problem.  TSH elevated 7.11.  Asymptomatic.  No thyroid nodules palpable. Recheck TSH in 8 weeks.

## 2023-01-27 NOTE — Assessment & Plan Note (Signed)
Chronic.  Not taking iron supplements.  Asymptomatic. Check iron panel today Restart iron supplements Referral sent to GI for colonoscopy.

## 2023-01-27 NOTE — Assessment & Plan Note (Signed)
Chronic.  Well-controlled on current medication.  Self discontinued low-dose aspirin Continue propranolol 40 mg twice daily

## 2023-01-27 NOTE — Assessment & Plan Note (Addendum)
No recent falls Depression and anxiety screening negative Declined shingles vaccine Recommend tetanus booster Mammogram referral sent.  Due 08/24 Colonoscopy due 9/24.  Referral sent. Medicare annual wellness completed Pap smear not indicated No indication for clinical breast exam.  Recommend home self breast examinations regularly

## 2023-02-17 DIAGNOSIS — M810 Age-related osteoporosis without current pathological fracture: Secondary | ICD-10-CM | POA: Diagnosis not present

## 2023-02-20 DIAGNOSIS — M810 Age-related osteoporosis without current pathological fracture: Secondary | ICD-10-CM | POA: Diagnosis not present

## 2023-03-07 NOTE — Progress Notes (Deleted)
Cardiology Office Note    Date:  03/07/2023   ID:  Katie, [name] [birth year], MRN [id number]  PCP:  Dana Allan, MD  Cardiologist:  Lorine Bears, MD  Electrophysiologist:  None   Chief Complaint: Follow up  History of Present Illness:   Katie [name] is a 77 y.o. female with history of CAD medically managed as outlined below, palpitations, HTN, HLD, and GERD who presents for follow-up of her CAD.   She was evaluated by Dr. Kirke Corin in 08/2020 at the request of her PCP for pain in the left breast area.  At that time, she reported a history of palpitations that were controlled with propanolol as well as previous bilateral breast surgery with soreness in the left breast over the preceding several months.  Pain was occurring with rest and not related to exertion.  Area was tender to palpation.  EKG was without ischemic changes.  Cardiac chest pain was felt to be unlikely and she was advised to follow-up with her PCP.  More recently, she was evaluated in 10/2021 for sharp chest pain that occurred in the center of her chest while driving and was accompanied by diaphoresis.  She also continued to describe an ache in her left breast that had been present for many years with unrevealing work-up.  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  Subsequent coronary CTA demonstrated a calcium score of 32.7 which was the 47th percentile with moderate stenosis involving the LAD with ctFFR of the mid LAD being positive at 0.78.  She was subsequently evaluated by Dr. Azucena Cecil on 11/02/2021 and continued to note occasional chest discomfort.  She was started on Imdur 15 mg.  Echo on 11/15/2021 demonstrated an EF of 60 to 65%, no regional wall motion abnormalities, grade 1 diastolic dysfunction, normal RV systolic function and ventricular cavity size, mild to moderate mitral regurgitation, aortic valve sclerosis without evidence of stenosis, and an estimated right atrial pressure of 3 mmHg.    She was last seen  in the office in 11/2022 for evaluation of near syncope.  She reported having only eaten half a banana and a cup of coffee for the day.  Episode was felt to be related to decreased oral intake.  ***   Labs independently reviewed: 12/2022 - A1c 5.6, Hgb 10.9, PLT 226, potassium 4.0, BUN 12, serum creatinine 0.73, albumin 3.8, AST/ALT normal, TSH 7.11 11/2022 - TC 189, TG 136, HDL 69, LDL 103  Past Medical History:  Diagnosis Date   Acute recurrent sinusitis 02/15/2022   ANEMIA, PERNICIOUS 03/13/2007   Qualifier: Diagnosis of   By: Pasty Arch LPN, Regina       Anxiety    Anxiety 06/16/2019   Atypical chest pain 09/04/2020   Benign head tremor 11/08/2013   Bile duct stricture    BILE DUCT STRICTURE 04/26/2008   Qualifier: Diagnosis of   By: Fleet Contras LPN, Deborah       Breast mass, left    Breast pain, left 01/02/2021   Calculus of bile duct 04/26/2008   Qualifier: Diagnosis of   By: Fleet Contras LPN, Deborah       Choledocholithiasis    recurrent   Constipation 02/08/2022   COVID-19    09/30/20, 10/07/20    Depression 02/13/1995   Long history of this.    GERD (gastroesophageal reflux disease)    Hypertension    Migraine without aura, without mention of intractable migraine without mention of status migrainosus 01/03/2014   Rapid heart beat  Rotaviral gastroenteritis    12/2020    Past Surgical History:  Procedure Laterality Date   ABDOMINAL HYSTERECTOMY     BLADDER SURGERY     x 2   BREAST EXCISIONAL BIOPSY Right 2003   benign   BREAST LUMPECTOMY     right   BREAST LUMPECTOMY WITH RADIOACTIVE SEED LOCALIZATION Left 03/31/2017   Procedure: LEFT BREAST LUMPECTOMY WITH RADIOACTIVE SEED LOCALIZATION;  Surgeon: Abigail Miyamoto, MD;  Location: [facility] OR;  Service: General;  Laterality: Left;   CHOLECYSTECTOMY     COLONOSCOPY     CYSTOSCOPY     normal 03/18/22   Explor Lap, Hepatoduodenostomy  07/23/2011   Dr Madaline Brilliant, [facility]   FOOT SURGERY     both feet   GASTRIC BYPASS  05/04/2013   PARTIAL  HYSTERECTOMY     SKIN BIOPSY     skin bx RUB benign   TUBAL LIGATION     VEIN LIGATION AND STRIPPING     left leg   WRIST SURGERY     right    Current Medications: No outpatient medications have been marked as taking for the 03/11/23 encounter (Appointment) with Sondra Barges, PA-C.    Allergies:   Lipitor [atorvastatin calcium]   Social History   Socioeconomic History   Marital status: Married    Spouse name: Not on file   Number of children: 2   Years of education: college   Highest education level: Not on file  Occupational History   Occupation: retired    Associate Professor: RETIRED  Tobacco Use   Smoking status: Never    Passive exposure: Never   Smokeless tobacco: Never  Vaping Use   Vaping Use: Never used  Substance and Sexual Activity   Alcohol use: No   Drug use: No   Sexual activity: Yes  Other Topics Concern   Not on file  Social History Narrative   Does not have a living will or HPOA.   Desires CPR.   Does not want prolonged life support.   Social Determinants of Health   Financial Resource Strain: Low Risk  (12/13/2021)   Overall Financial Resource Strain (CARDIA)    Difficulty of Paying Living Expenses: Not hard at all  Food Insecurity: No Food Insecurity (12/19/2022)   Hunger Vital Sign    Worried About Running Out of Food in the Last Year: Never true    Ran Out of Food in the Last Year: Never true  Transportation Needs: No Transportation Needs (12/19/2022)   PRAPARE - Administrator, Civil Service (Medical): No    Lack of Transportation (Non-Medical): No  Physical Activity: Insufficiently Active (12/19/2022)   Exercise Vital Sign    Days of Exercise per Week: 3 days    Minutes of Exercise per Session: 10 min  Stress: No Stress Concern Present (12/13/2021)   [employer] of Occupational Health - Occupational Stress Questionnaire    Feeling of Stress : Not at all  Social Connections: Socially Integrated (12/19/2022)   Social Connection and  Isolation Panel [NHANES]    Frequency of Communication with Friends and Family: Not on file    Frequency of Social Gatherings with Friends and Family: More than three times a week    Attends Religious Services: More than 4 times per year    Active Member of [employer] or Organizations: Yes    Attends Engineer, structural: More than 4 times per year    Marital Status: Married     Family  History:  The patient's family history includes Alcohol abuse in her father; Anxiety disorder in her sister; Cancer in her mother; Depression in her sister; GER disease in her brother and sister; Heart attack in her sister; Hypertension in her sister. There is no history of Migraines or Breast cancer.  ROS:   12-point review of systems is negative unless otherwise noted in the HPI.   EKGs/Labs/Other Studies Reviewed:    Studies reviewed were summarized above. The additional studies were reviewed today:  2D echo 11/15/2021: 1. Left ventricular ejection fraction, by estimation, is 60 to 65%. The  left ventricle has normal function. The left ventricle has no regional  wall motion abnormalities. Left ventricular diastolic parameters are  consistent with Grade I diastolic  dysfunction (impaired relaxation).   2. Right ventricular systolic function is normal. The right ventricular  size is normal.   3. The mitral valve is normal in structure. Mild to moderate mitral valve  regurgitation. No evidence of mitral stenosis.   4. The aortic valve is normal in structure. Aortic valve regurgitation is  not visualized. Aortic valve sclerosis is present, with no evidence of  aortic valve stenosis.   5. The inferior vena cava is normal in size with greater than 50%  respiratory variability, suggesting right atrial pressure of 3 mmHg. __________   Coronary CTA with CT FFR 11/01/2021: FINDINGS: Aorta:  Normal size.  No calcifications.  No dissection.   Aortic Valve:  Trileaflet.  No calcifications.   Coronary  Arteries:  Normal coronary origin.  Right dominance.   RCA is a dominant artery that gives rise to PDA and PLA. There is no plaque.   Left main is a large artery that gives rise to LAD and LCX arteries. There is no LM disease.   LAD calcified plaque in the mid vessel causing moderate stenosis (50%). There is mild LAD stenosis proximally.   LCX is a non-dominant artery that gives rise to one OM1 branch. There is minimal calcification in the proximal LCx.   Other findings:   Normal pulmonary vein drainage into the left atrium.   Normal left atrial appendage without a thrombus.   Normal size of the pulmonary artery.   IMPRESSION: 1. Coronary calcium score of 32.7. This was 47th percentile for age and sex matched control. 2. Normal coronary origin with right dominance. 3. Calcified plaque causing moderate mid LAD stenosis. 4. CAD-RADS 3. Moderate stenosis. Consider symptom-guided anti-ischemic pharmacotherapy as well as risk factor modification per guideline directed care. 5. Additional analysis with CT FFR will be submitted and reported separately.   1. Left Main:  No significant stenosis. 2. LAD: Significant non focal mid LAD stenosis.  FFRct 0.78 3. LCX: No significant stenosis.  FFRct 0.84 4. RCA: No significant stenosis.  FFRct 0.90   CT FFR: IMPRESSION: 1. CT FFR analysis showed significant non focal stenosis in the mid LAD. FFR ct 0.78. 2.  Recommend anti-anginal medications. 3. Consider cardiac catheterization if symptoms persist despite medical therapy.   EKG:  EKG is ordered today.  The EKG ordered today demonstrates ***  Recent Labs: 12/26/2022: ALT 13; BUN 12; Creatinine, Ser 0.73; Hemoglobin 10.9; Platelets 226.0; Potassium 4.0; Sodium 139; TSH 7.11  Recent Lipid Panel    Component Value Date/Time   CHOL 199 11/28/2022 1155   CHOL 186 11/16/2021 0900   TRIG 136 11/28/2022 1155   HDL 69 11/28/2022 1155   HDL 64 11/16/2021 0900   CHOLHDL 2.9 11/28/2022  1155   VLDL  27 11/28/2022 1155   LDLCALC 103 (H) 11/28/2022 1155   LDLCALC 101 (H) 11/16/2021 0900   LDLCALC 111 (H) 06/23/2020 1523   LDLDIRECT 116 (H) 11/28/2022 1155    PHYSICAL EXAM:    VS:  There were no vitals taken for this visit.  BMI: There is no height or weight on file to calculate BMI.  Physical Exam  Wt Readings from Last 3 Encounters:  01/10/23 118 lb (53.5 kg)  12/19/22 123 lb 6.4 oz (56 kg)  11/28/22 123 lb 6.4 oz (56 kg)     ASSESSMENT & PLAN:   CAD involving the native coronary arteries without***angina:  Mitral regurgitation:  HTN: Blood pressure  HLD: LDL 103 in 11/2022 with goal being less than 70.  She is intolerant to atorvastatin.   {Are you ordering a CV Procedure (e.g. stress test, cath, DCCV, TEE, etc)?   Press F2        :[id number]}     Disposition: F/u with Dr. Kirke Corin or an APP in ***.   Medication Adjustments/Labs and Tests Ordered: Current medicines are reviewed at length with the patient today.  Concerns regarding medicines are outlined above. Medication changes, Labs and Tests ordered today are summarized above and listed in the Patient Instructions accessible in Encounters.   Signed, Eula Listen, PA-C 03/07/2023 1:53 PM     [facility] [place] [address] [place], [place] [address] [phone]

## 2023-03-11 ENCOUNTER — Ambulatory Visit: Payer: Medicare PPO | Admitting: Physician Assistant

## 2023-03-11 DIAGNOSIS — E785 Hyperlipidemia, unspecified: Secondary | ICD-10-CM

## 2023-03-11 DIAGNOSIS — I10 Essential (primary) hypertension: Secondary | ICD-10-CM

## 2023-03-11 DIAGNOSIS — I340 Nonrheumatic mitral (valve) insufficiency: Secondary | ICD-10-CM

## 2023-03-11 DIAGNOSIS — I2510 Atherosclerotic heart disease of native coronary artery without angina pectoris: Secondary | ICD-10-CM

## 2023-03-27 ENCOUNTER — Other Ambulatory Visit: Payer: Self-pay | Admitting: Family Medicine

## 2023-03-27 DIAGNOSIS — I2510 Atherosclerotic heart disease of native coronary artery without angina pectoris: Secondary | ICD-10-CM

## 2023-04-15 NOTE — Progress Notes (Deleted)
Cardiology Office Note    Date:  04/15/2023   ID:  Jadan, Antis Sep 20, 1945, MRN [id number]  PCP:  Dana Allan, MD  Cardiologist:  Lorine Bears, MD  Electrophysiologist:  None   Chief Complaint: ***  History of Present Illness:   Katie [name] is a 77 y.o. female with history of CAD medically managed as outlined below, palpitations, HTN, HLD, and GERD who presents for follow-up of her CAD.   She was evaluated by Dr. Kirke Corin in 08/2020 at the request of her PCP for pain in the left breast area.  At that time, she reported a history of palpitations that were controlled with propanolol as well as previous bilateral breast surgery with soreness in the left breast over the preceding several months.  Pain was occurring with rest and not related to exertion.  Area was tender to palpation.  EKG was without ischemic changes.  Cardiac chest pain was felt to be unlikely and she was advised to follow-up with her PCP.  More recently, she was evaluated in 10/2021 for sharp chest pain that occurred in the center of her chest while driving and was accompanied by diaphoresis.  She also continued to describe an ache in her left breast that had been present for many years with unrevealing work-up.  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  Subsequent coronary CTA demonstrated a calcium score of 32.7 which was the 47th percentile with moderate stenosis involving the LAD with ctFFR of the mid LAD being positive at 0.78.  She was evaluated by Dr. Azucena Cecil on 11/02/2021 and continued to note occasional chest discomfort.  She was started on Imdur 15 mg.  Subsequent echo on 11/15/2021 demonstrated an EF of 60 to 65%, no regional wall motion abnormalities, grade 1 diastolic dysfunction, normal RV systolic function and ventricular cavity size, mild to moderate mitral regurgitation, aortic valve sclerosis without evidence of stenosis, and an estimated right atrial pressure of 3 mmHg.  She was last seen in the  office in 11/2022 for evaluation of an episode of near syncope with minimal caloric intake that day including half a banana and a cup of coffee.  She felt better after eating.  No symptoms of chest pain, palpitations, or dyspnea with this event.  Symptoms were felt to be in the setting of diminished caloric intake.  ***   Labs independently reviewed: 12/2022 - A1c 5.6, Hgb 10.9, PLT 226, potassium 4.0, BUN 12, serum creatinine 0.73, albumin 3.8, AST/ALT normal, TSH 7.11 11/2022 - TC 199, TG 136, HDL 69, LDL 103, direct LDL 116  Past Medical History:  Diagnosis Date   Acute recurrent sinusitis 02/15/2022   ANEMIA, PERNICIOUS 03/13/2007   Qualifier: Diagnosis of   By: Pasty Arch LPN, Regina       Anxiety    Anxiety 06/16/2019   Atypical chest pain 09/04/2020   Benign head tremor 11/08/2013   Bile duct stricture    BILE DUCT STRICTURE 04/26/2008   Qualifier: Diagnosis of   By: Fleet Contras LPN, Deborah       Breast mass, left    Breast pain, left 01/02/2021   Calculus of bile duct 04/26/2008   Qualifier: Diagnosis of   By: Fleet Contras LPN, Deborah       Choledocholithiasis    recurrent   Constipation 02/08/2022   COVID-19    09/30/20, 10/07/20    Depression 02/13/1995   Long history of this.    GERD (gastroesophageal reflux disease)    Hypertension  Migraine without aura, without mention of intractable migraine without mention of status migrainosus 01/03/2014   Rapid heart beat    Rotaviral gastroenteritis    12/2020    Past Surgical History:  Procedure Laterality Date   ABDOMINAL HYSTERECTOMY     BLADDER SURGERY     x 2   BREAST EXCISIONAL BIOPSY Right 2003   benign   BREAST LUMPECTOMY     right   BREAST LUMPECTOMY WITH RADIOACTIVE SEED LOCALIZATION Left 03/31/2017   Procedure: LEFT BREAST LUMPECTOMY WITH RADIOACTIVE SEED LOCALIZATION;  Surgeon: Abigail Miyamoto, MD;  Location: [facility] OR;  Service: General;  Laterality: Left;   CHOLECYSTECTOMY     COLONOSCOPY     CYSTOSCOPY     normal  03/18/22   Explor Lap, Hepatoduodenostomy  07/23/2011   Dr Madaline Brilliant, [facility]   FOOT SURGERY     both feet   GASTRIC BYPASS  05/04/2013   PARTIAL HYSTERECTOMY     SKIN BIOPSY     skin bx RUB benign   TUBAL LIGATION     VEIN LIGATION AND STRIPPING     left leg   WRIST SURGERY     right    Current Medications: No outpatient medications have been marked as taking for the 04/16/23 encounter (Appointment) with Sondra Barges, PA-C.    Allergies:   Lipitor [atorvastatin calcium]   Social History   Socioeconomic History   Marital status: Married    Spouse name: Not on file   Number of children: 2   Years of education: college   Highest education level: Not on file  Occupational History   Occupation: retired    Associate Professor: RETIRED  Tobacco Use   Smoking status: Never    Passive exposure: Never   Smokeless tobacco: Never  Vaping Use   Vaping status: Never Used  Substance and Sexual Activity   Alcohol use: No   Drug use: No   Sexual activity: Yes  Other Topics Concern   Not on file  Social History Narrative   Does not have a living will or HPOA.   Desires CPR.   Does not want prolonged life support.   Social Determinants of Health   Financial Resource Strain: Low Risk  (12/13/2021)   Overall Financial Resource Strain (CARDIA)    Difficulty of Paying Living Expenses: Not hard at all  Food Insecurity: No Food Insecurity (12/19/2022)   Hunger Vital Sign    Worried About Running Out of Food in the Last Year: Never true    Ran Out of Food in the Last Year: Never true  Transportation Needs: No Transportation Needs (12/19/2022)   PRAPARE - Administrator, Civil Service (Medical): No    Lack of Transportation (Non-Medical): No  Physical Activity: Insufficiently Active (12/19/2022)   Exercise Vital Sign    Days of Exercise per Week: 3 days    Minutes of Exercise per Session: 10 min  Stress: No Stress Concern Present (12/13/2021)   [employer] of Occupational Health -  Occupational Stress Questionnaire    Feeling of Stress : Not at all  Social Connections: Socially Integrated (12/19/2022)   Social Connection and Isolation Panel [NHANES]    Frequency of Communication with Friends and Family: Not on file    Frequency of Social Gatherings with Friends and Family: More than three times a week    Attends Religious Services: More than 4 times per year    Active Member of [employer] or Organizations: Yes  Attends Engineer, structural: More than 4 times per year    Marital Status: Married     Family History:  The patient's family history includes Alcohol abuse in her father; Anxiety disorder in her sister; Cancer in her mother; Depression in her sister; GER disease in her brother and sister; Heart attack in her sister; Hypertension in her sister. There is no history of Migraines or Breast cancer.  ROS:   12-point review of systems is negative unless otherwise noted in the HPI.   EKGs/Labs/Other Studies Reviewed:    Studies reviewed were summarized above. The additional studies were reviewed today:  2D echo 11/15/2021: 1. Left ventricular ejection fraction, by estimation, is 60 to 65%. The  left ventricle has normal function. The left ventricle has no regional  wall motion abnormalities. Left ventricular diastolic parameters are  consistent with Grade I diastolic  dysfunction (impaired relaxation).   2. Right ventricular systolic function is normal. The right ventricular  size is normal.   3. The mitral valve is normal in structure. Mild to moderate mitral valve  regurgitation. No evidence of mitral stenosis.   4. The aortic valve is normal in structure. Aortic valve regurgitation is  not visualized. Aortic valve sclerosis is present, with no evidence of  aortic valve stenosis.   5. The inferior vena cava is normal in size with greater than 50%  respiratory variability, suggesting right atrial pressure of 3 mmHg. __________   Coronary CTA with CT  FFR 11/01/2021: FINDINGS: Aorta:  Normal size.  No calcifications.  No dissection.   Aortic Valve:  Trileaflet.  No calcifications.   Coronary Arteries:  Normal coronary origin.  Right dominance.   RCA is a dominant artery that gives rise to PDA and PLA. There is no plaque.   Left main is a large artery that gives rise to LAD and LCX arteries. There is no LM disease.   LAD calcified plaque in the mid vessel causing moderate stenosis (50%). There is mild LAD stenosis proximally.   LCX is a non-dominant artery that gives rise to one OM1 branch. There is minimal calcification in the proximal LCx.   Other findings:   Normal pulmonary vein drainage into the left atrium.   Normal left atrial appendage without a thrombus.   Normal size of the pulmonary artery.   IMPRESSION: 1. Coronary calcium score of 32.7. This was 47th percentile for age and sex matched control. 2. Normal coronary origin with right dominance. 3. Calcified plaque causing moderate mid LAD stenosis. 4. CAD-RADS 3. Moderate stenosis. Consider symptom-guided anti-ischemic pharmacotherapy as well as risk factor modification per guideline directed care. 5. Additional analysis with CT FFR will be submitted and reported separately.   1. Left Main:  No significant stenosis. 2. LAD: Significant non focal mid LAD stenosis.  FFRct 0.78 3. LCX: No significant stenosis.  FFRct 0.84 4. RCA: No significant stenosis.  FFRct 0.90   CT FFR: IMPRESSION: 1. CT FFR analysis showed significant non focal stenosis in the mid LAD. FFR ct 0.78. 2.  Recommend anti-anginal medications. 3. Consider cardiac catheterization if symptoms persist despite medical therapy.   EKG:  EKG is ordered today.  The EKG ordered today demonstrates ***  Recent Labs: 12/26/2022: ALT 13; BUN 12; Creatinine, Ser 0.73; Hemoglobin 10.9; Platelets 226.0; Potassium 4.0; Sodium 139; TSH 7.11  Recent Lipid Panel    Component Value Date/Time   CHOL 199  11/28/2022 1155   CHOL 186 11/16/2021 0900   TRIG 136 11/28/2022  1155   HDL 69 11/28/2022 1155   HDL 64 11/16/2021 0900   CHOLHDL 2.9 11/28/2022 1155   VLDL 27 11/28/2022 1155   LDLCALC 103 (H) 11/28/2022 1155   LDLCALC 101 (H) 11/16/2021 0900   LDLCALC 111 (H) 06/23/2020 1523   LDLDIRECT 116 (H) 11/28/2022 1155    PHYSICAL EXAM:    VS:  There were no vitals taken for this visit.  BMI: There is no height or weight on file to calculate BMI.  Physical Exam  Wt Readings from Last 3 Encounters:  01/10/23 118 lb (53.5 kg)  12/19/22 123 lb 6.4 oz (56 kg)  11/28/22 123 lb 6.4 oz (56 kg)     ASSESSMENT & PLAN:   CAD involving the native coronary arteries without angina:  HTN: Blood pressure  HLD: LDL 116 in 11/2022 with normal AST/ALT in 12/2022.  Mitral regurgitation:  History of near syncope:   {Are you ordering a CV Procedure (e.g. stress test, cath, DCCV, TEE, etc)?   Press F2        :[id number]}     Disposition: F/u with Dr. Kirke Corin or an APP in ***.   Medication Adjustments/Labs and Tests Ordered: Current medicines are reviewed at length with the patient today.  Concerns regarding medicines are outlined above. Medication changes, Labs and Tests ordered today are summarized above and listed in the Patient Instructions accessible in Encounters.   Signed, Eula Listen, PA-C 04/15/2023 2:21 PM     [facility] [place] [address] [place], [place] [address] [phone]

## 2023-04-16 ENCOUNTER — Ambulatory Visit: Payer: Medicare PPO | Admitting: Physician Assistant

## 2023-04-26 ENCOUNTER — Other Ambulatory Visit: Payer: Self-pay | Admitting: Family Medicine

## 2023-04-26 DIAGNOSIS — I2510 Atherosclerotic heart disease of native coronary artery without angina pectoris: Secondary | ICD-10-CM

## 2023-05-26 ENCOUNTER — Encounter: Payer: Self-pay | Admitting: Physician Assistant

## 2023-05-26 ENCOUNTER — Ambulatory Visit: Payer: Medicare PPO | Attending: Physician Assistant | Admitting: Physician Assistant

## 2023-05-26 VITALS — BP 135/64 | HR 57 | Ht 64.0 in | Wt 119.0 lb

## 2023-05-26 DIAGNOSIS — I340 Nonrheumatic mitral (valve) insufficiency: Secondary | ICD-10-CM | POA: Diagnosis not present

## 2023-05-26 DIAGNOSIS — Z789 Other specified health status: Secondary | ICD-10-CM | POA: Diagnosis not present

## 2023-05-26 DIAGNOSIS — E785 Hyperlipidemia, unspecified: Secondary | ICD-10-CM | POA: Diagnosis not present

## 2023-05-26 DIAGNOSIS — I10 Essential (primary) hypertension: Secondary | ICD-10-CM | POA: Diagnosis not present

## 2023-05-26 DIAGNOSIS — I2510 Atherosclerotic heart disease of native coronary artery without angina pectoris: Secondary | ICD-10-CM

## 2023-05-26 NOTE — Patient Instructions (Signed)
Medication Instructions:  Your Physician recommend you continue on your current medication as directed.    *If you need a refill on your cardiac medications before your next appointment, please call your pharmacy*  Lab Work: None If you have labs (blood work) drawn today and your tests are completely normal, you will receive your results only by: MyChart Message (if you have MyChart) OR A paper copy in the mail If you have any lab test that is abnormal or we need to change your treatment, we will call you to review the results.  Testing/Procedures: None  Follow-Up: At [facility], you and your health needs are our priority.  As part of our continuing mission to provide you with exceptional heart care, we have created designated Provider Care Teams.  These Care Teams include your primary Cardiologist (physician) and Advanced Practice Providers (APPs -  Physician Assistants and Nurse Practitioners) who all work together to provide you with the care you need, when you need it.  We recommend signing up for the patient portal called "MyChart".  Sign up information is provided on this After Visit Summary.  MyChart is used to connect with patients for Virtual Visits (Telemedicine).  Patients are able to view lab/test results, encounter notes, upcoming appointments, etc.  Non-urgent messages can be sent to your provider as well.   To learn more about what you can do with MyChart, go to [email or web address].    Your next appointment:   6 month(s)  Provider:   You may see Lorine Bears, MD or one of the following Advanced Practice Providers on your designated Care Team:   Eula Listen, [place]

## 2023-05-26 NOTE — Progress Notes (Signed)
Cardiology Office Note    Date:  05/26/2023   ID:  Katie, [name] [birth year], MRN [id number]  PCP:  Dana Allan, MD  Cardiologist:  Lorine Bears, MD  Electrophysiologist:  None   Chief Complaint: Follow up  History of Present Illness:   Katie [name] is a 77 y.o. female with history of CAD medically managed as outlined below, palpitations, HTN, HLD, and GERD who presents for follow-up of her CAD.   She was evaluated by Dr. Kirke Corin in 08/2020 at the request of her PCP for pain in the left breast area.  At that time, she reported a history of palpitations that were controlled with propanolol as well as previous bilateral breast surgery with soreness in the left breast over the preceding several months.  Pain was occurring with rest and not related to exertion.  Area was tender to palpation.  EKG was without ischemic changes.  Cardiac chest pain was felt to be unlikely and she was advised to follow-up with her PCP.  She was evaluated in 10/2021 for sharp chest pain that occurred in the center of her chest while driving and was accompanied by diaphoresis.  She also continued to describe an ache in her left breast that had been present for many years with unrevealing work-up.  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  Subsequent coronary CTA demonstrated a calcium score of 32.7 which was the 47th percentile with moderate stenosis involving the LAD with ctFFR of the mid LAD being positive at 0.78.  She was evaluated by Dr. Azucena Cecil on 11/02/2021 and continued to note occasional chest discomfort.  She was started on Imdur 15 mg.  Echo on 11/15/2021 demonstrated an EF of 60 to 65%, no regional wall motion abnormalities, grade 1 diastolic dysfunction, normal RV systolic function and ventricular cavity size, mild to moderate mitral regurgitation, aortic valve sclerosis without evidence of stenosis, and an estimated right atrial pressure of 3 mmHg.  She was seen in the office in 3/24 for evaluation  of near syncope in the setting of diminished oral intake with improvement in symptoms after eating.  Further workup was not pursued at that time  She comes in doing very well from a cardiac perspective and is without symptoms of angina or cardiac decompensation.  No palpitations, dyspnea, dizziness, presyncope, or syncope.  No falls, hematochezia, or melena.  She remains adherent and tolerating cardiac medications.  She remains active at baseline and continues to work at [employer].  She does note some mild bilateral ankle swelling if she has been up on her feet for extended timeframe, this improves with leg elevation.  Overall, she was like she is doing well and does not have any acute cardiac concerns at this time.   Labs independently reviewed: 12/2022 - A1c 5.6, Hgb 10.9, PLT 226, potassium 4.0, BUN 12, serum creatinine 0.73, albumin 3.8, AST/ALT normal, TSH 7.11 11/2022 - TC 199, TG 136, HDL 69, LDL 103  Past Medical History:  Diagnosis Date   Acute recurrent sinusitis 02/15/2022   ANEMIA, PERNICIOUS 03/13/2007   Qualifier: Diagnosis of   By: Pasty Arch LPN, Regina       Anxiety    Anxiety 06/16/2019   Atypical chest pain 09/04/2020   Benign head tremor 11/08/2013   Bile duct stricture    BILE DUCT STRICTURE 04/26/2008   Qualifier: Diagnosis of   By: Fleet Contras LPN, Deborah       Breast mass, left    Breast pain, left  01/02/2021   Calculus of bile duct 04/26/2008   Qualifier: Diagnosis of   By: Fleet Contras LPN, Deborah       Choledocholithiasis    recurrent   Constipation 02/08/2022   COVID-19    09/30/20, 10/07/20    Depression 02/13/1995   Long history of this.    GERD (gastroesophageal reflux disease)    Hypertension    Migraine without aura, without mention of intractable migraine without mention of status migrainosus 01/03/2014   Rapid heart beat    Rotaviral gastroenteritis    12/2020    Past Surgical History:  Procedure Laterality Date   ABDOMINAL HYSTERECTOMY     BLADDER SURGERY      x 2   BREAST EXCISIONAL BIOPSY Right 2003   benign   BREAST LUMPECTOMY     right   BREAST LUMPECTOMY WITH RADIOACTIVE SEED LOCALIZATION Left 03/31/2017   Procedure: LEFT BREAST LUMPECTOMY WITH RADIOACTIVE SEED LOCALIZATION;  Surgeon: Abigail Miyamoto, MD;  Location: [facility] OR;  Service: General;  Laterality: Left;   CHOLECYSTECTOMY     COLONOSCOPY     CYSTOSCOPY     normal 03/18/22   Explor Lap, Hepatoduodenostomy  07/23/2011   Dr Madaline Brilliant, [facility]   FOOT SURGERY     both feet   GASTRIC BYPASS  05/04/2013   PARTIAL HYSTERECTOMY     SKIN BIOPSY     skin bx RUB benign   TUBAL LIGATION     VEIN LIGATION AND STRIPPING     left leg   WRIST SURGERY     right    Current Medications: Current Meds  Medication Sig   Biotin 1 MG CAPS Take 1 mg by mouth daily.    CHOLECALCIFEROL PO Take by mouth daily.   denosumab (PROLIA) 60 MG/ML SOSY injection Inject 60 mg into the skin every 6 (six) months.   esomeprazole (NEXIUM) 40 MG capsule Take 1 capsule (40 mg total) by mouth 2 (two) times daily before a meal. 30 minutes before food   ezetimibe (ZETIA) 10 MG tablet TAKE 1 TABLET(10 MG) BY MOUTH DAILY   Ferrous Sulfate (IRON PO) Take 1 tablet by mouth every other day. OTC   isosorbide mononitrate (IMDUR) 30 MG 24 hr tablet TAKE 1/2 TABLET(15 MG) BY MOUTH DAILY   Multiple Vitamins-Minerals (PRESERVISION AREDS) CAPS Take by mouth 2 (two) times daily.   ondansetron (ZOFRAN) 4 MG tablet Take 1 tablet (4 mg total) by mouth every 8 (eight) hours as needed for nausea or vomiting.   propranolol (INDERAL) 40 MG tablet Take 1 tablet (40 mg total) by mouth 2 (two) times daily.   simvastatin (ZOCOR) 40 MG tablet TAKE 1 TABLET(40 MG) BY MOUTH DAILY AT 6 PM   ursodiol (ACTIGALL) 250 MG tablet Take one three times a day   venlafaxine (EFFEXOR) 37.5 MG tablet TAKE 2 TABLETS BY MOUTH EVERY MORNING AND 1 TABLET EVERY EVENING    Allergies:   Lipitor [atorvastatin calcium]   Social History   Socioeconomic History    Marital status: Married    Spouse name: Not on file   Number of children: 2   Years of education: college   Highest education level: Not on file  Occupational History   Occupation: retired    Associate Professor: RETIRED  Tobacco Use   Smoking status: Never    Passive exposure: Never   Smokeless tobacco: Never  Vaping Use   Vaping status: Never Used  Substance and Sexual Activity   Alcohol use: No   Drug  use: No   Sexual activity: Yes  Other Topics Concern   Not on file  Social History Narrative   Does not have a living will or HPOA.   Desires CPR.   Does not want prolonged life support.   Social Determinants of Health   Financial Resource Strain: Low Risk  (12/13/2021)   Overall Financial Resource Strain (CARDIA)    Difficulty of Paying Living Expenses: Not hard at all  Food Insecurity: No Food Insecurity (12/19/2022)   Hunger Vital Sign    Worried About Running Out of Food in the Last Year: Never true    Ran Out of Food in the Last Year: Never true  Transportation Needs: No Transportation Needs (12/19/2022)   PRAPARE - Administrator, Civil Service (Medical): No    Lack of Transportation (Non-Medical): No  Physical Activity: Insufficiently Active (12/19/2022)   Exercise Vital Sign    Days of Exercise per Week: 3 days    Minutes of Exercise per Session: 10 min  Stress: No Stress Concern Present (12/13/2021)   [employer] of Occupational Health - Occupational Stress Questionnaire    Feeling of Stress : Not at all  Social Connections: Socially Integrated (12/19/2022)   Social Connection and Isolation Panel [NHANES]    Frequency of Communication with Friends and Family: Not on file    Frequency of Social Gatherings with Friends and Family: More than three times a week    Attends Religious Services: More than 4 times per year    Active Member of [employer] or Organizations: Yes    Attends Engineer, structural: More than 4 times per year    Marital Status: Married      Family History:  The patient's family history includes Alcohol abuse in her father; Anxiety disorder in her sister; Cancer in her mother; Depression in her sister; GER disease in her brother and sister; Heart attack in her sister; Hypertension in her sister. There is no history of Migraines or Breast cancer.  ROS:   12-point review of systems is negative unless otherwise noted in the HPI.   EKGs/Labs/Other Studies Reviewed:    Studies reviewed were summarized above. The additional studies were reviewed today:  2D echo 11/15/2021: 1. Left ventricular ejection fraction, by estimation, is 60 to 65%. The  left ventricle has normal function. The left ventricle has no regional  wall motion abnormalities. Left ventricular diastolic parameters are  consistent with Grade I diastolic  dysfunction (impaired relaxation).   2. Right ventricular systolic function is normal. The right ventricular  size is normal.   3. The mitral valve is normal in structure. Mild to moderate mitral valve  regurgitation. No evidence of mitral stenosis.   4. The aortic valve is normal in structure. Aortic valve regurgitation is  not visualized. Aortic valve sclerosis is present, with no evidence of  aortic valve stenosis.   5. The inferior vena cava is normal in size with greater than 50%  respiratory variability, suggesting right atrial pressure of 3 mmHg. __________   Coronary CTA with CT FFR 11/01/2021: FINDINGS: Aorta:  Normal size.  No calcifications.  No dissection.   Aortic Valve:  Trileaflet.  No calcifications.   Coronary Arteries:  Normal coronary origin.  Right dominance.   RCA is a dominant artery that gives rise to PDA and PLA. There is no plaque.   Left main is a large artery that gives rise to LAD and LCX arteries. There is no LM disease.  LAD calcified plaque in the mid vessel causing moderate stenosis (50%). There is mild LAD stenosis proximally.   LCX is a non-dominant artery that  gives rise to one OM1 branch. There is minimal calcification in the proximal LCx.   Other findings:   Normal pulmonary vein drainage into the left atrium.   Normal left atrial appendage without a thrombus.   Normal size of the pulmonary artery.   IMPRESSION: 1. Coronary calcium score of 32.7. This was 47th percentile for age and sex matched control. 2. Normal coronary origin with right dominance. 3. Calcified plaque causing moderate mid LAD stenosis. 4. CAD-RADS 3. Moderate stenosis. Consider symptom-guided anti-ischemic pharmacotherapy as well as risk factor modification per guideline directed care. 5. Additional analysis with CT FFR will be submitted and reported separately.   1. Left Main:  No significant stenosis. 2. LAD: Significant non focal mid LAD stenosis.  FFRct 0.78 3. LCX: No significant stenosis.  FFRct 0.84 4. RCA: No significant stenosis.  FFRct 0.90   CT FFR: IMPRESSION: 1. CT FFR analysis showed significant non focal stenosis in the mid LAD. FFR ct 0.78. 2.  Recommend anti-anginal medications. 3. Consider cardiac catheterization if symptoms persist despite medical therapy.   EKG:  EKG is ordered today.  The EKG ordered today demonstrates sinus bradycardia, 57 bpm, nonspecific anterior st/t changes  Recent Labs: 12/26/2022: ALT 13; BUN 12; Creatinine, Ser 0.73; Hemoglobin 10.9; Platelets 226.0; Potassium 4.0; Sodium 139; TSH 7.11  Recent Lipid Panel    Component Value Date/Time   CHOL 199 11/28/2022 1155   CHOL 186 11/16/2021 0900   TRIG 136 11/28/2022 1155   HDL 69 11/28/2022 1155   HDL 64 11/16/2021 0900   CHOLHDL 2.9 11/28/2022 1155   VLDL 27 11/28/2022 1155   LDLCALC 103 (H) 11/28/2022 1155   LDLCALC 101 (H) 11/16/2021 0900   LDLCALC 111 (H) 06/23/2020 1523   LDLDIRECT 116 (H) 11/28/2022 1155    PHYSICAL EXAM:    VS:  BP 135/64 (BP Location: Left Arm, Patient Position: Sitting, Cuff Size: Normal)   Pulse (!) 57   Ht 5\' 4"  (1.626 m)   Wt  119 lb (54 kg)   SpO2 97%   BMI 20.43 kg/m   BMI: Body mass index is 20.43 kg/m.  Physical Exam Vitals reviewed.  Constitutional:      Appearance: She is well-developed.  HENT:     Head: Normocephalic and atraumatic.  Eyes:     General:        Right eye: No discharge.        Left eye: No discharge.  Cardiovascular:     Rate and Rhythm: Normal rate and regular rhythm.     Heart sounds: S1 normal and S2 normal. Heart sounds not distant. No midsystolic click and no opening snap. Murmur heard.     Systolic murmur is present with a grade of 1/6 at the lower left sternal border.     No friction rub.  Pulmonary:     Effort: Pulmonary effort is normal. No respiratory distress.     Breath sounds: Normal breath sounds. No decreased breath sounds, wheezing or rales.  Chest:     Chest wall: No tenderness.  Abdominal:     General: There is no distension.  Musculoskeletal:     Cervical back: Normal range of motion.     Right lower leg: No edema.     Left lower leg: No edema.  Skin:    General: Skin is warm  and dry.     Nails: There is no clubbing.  Neurological:     Mental Status: She is alert and oriented to person, place, and time.  Psychiatric:        Speech: Speech normal.        Behavior: Behavior normal.        Thought Content: Thought content normal.        Judgment: Judgment normal.     Wt Readings from Last 3 Encounters:  05/26/23 119 lb (54 kg)  01/10/23 118 lb (53.5 kg)  12/19/22 123 lb 6.4 oz (56 kg)     ASSESSMENT & PLAN:   CAD involving the native coronary arteries without angina: She continues to do very well and is without symptoms of angina or cardiac decompensation.  Continue primary prevention and aggressive risk factor modification including aspirin, Imdur, ezetimibe, and simvastatin.  Given resolution of symptoms, no indication for further ischemic testing at this time.  Should symptoms return, we can determine at that time if escalation of medical therapy  is indicated, or if we should proceed with LHC.  HTN: Blood pressure is reasonably controlled in the office today.  She remains on propranolol and Imdur.  HLD with high intensity statin intolerance: LDL 103 in 11/2022 with goal being less than 70.  She is intolerant to atorvastatin.  Currently on simvastatin and ezetimibe.  Has significantly improved her diet.  Would look to obtain fasting lipid panel at next visit.  Mitral regurgitation: Mild to moderate by echo in 11/2021.  Anticipate obtaining echo at next visit.     Disposition: F/u with Dr. Kirke Corin or an APP in 6 months.   Medication Adjustments/Labs and Tests Ordered: Current medicines are reviewed at length with the patient today.  Concerns regarding medicines are outlined above. Medication changes, Labs and Tests ordered today are summarized above and listed in the Patient Instructions accessible in Encounters.   Signed, Eula Listen, PA-C 05/26/2023 2:35 PM     [facility] [place] [address] [place], [place] [address] [phone]

## 2023-07-03 ENCOUNTER — Ambulatory Visit
Admission: RE | Admit: 2023-07-03 | Discharge: 2023-07-03 | Disposition: A | Discharge status: Home / Self Care | Payer: Medicare PPO | Source: Ambulatory Visit | Attending: Family Medicine | Admitting: Family Medicine

## 2023-07-03 DIAGNOSIS — Z1231 Encounter for screening mammogram for malignant neoplasm of breast: Secondary | ICD-10-CM | POA: Diagnosis not present

## 2023-07-11 DIAGNOSIS — K8689 Other specified diseases of pancreas: Secondary | ICD-10-CM | POA: Diagnosis not present

## 2023-07-11 DIAGNOSIS — K838 Other specified diseases of biliary tract: Secondary | ICD-10-CM | POA: Diagnosis not present

## 2023-07-13 NOTE — Progress Notes (Unsigned)
   SUBJECTIVE:  No chief complaint on file.  HPI ***  PERTINENT PMH / PSH: ***  OBJECTIVE:  There were no vitals taken for this visit.   Physical Exam     01/10/2023   10:05 AM 12/19/2022    3:53 PM 10/30/2022    1:11 PM 03/29/2022    9:33 AM 02/15/2022    4:33 PM  Depression screen PHQ 2/9  Decreased Interest 0 0 0 0 0  Down, Depressed, Hopeless 0 0 0 0 0  PHQ - 2 Score 0 0 0 0 0  Altered sleeping 0    0  Tired, decreased energy 0    0  Change in appetite 0    0  Feeling bad or failure about yourself  0    0  Trouble concentrating 0    0  Moving slowly or fidgety/restless 0    0  Suicidal thoughts 0    0  PHQ-9 Score 0    0  Difficult doing work/chores Not difficult at all    Not difficult at all      10/30/2022    1:11 PM 01/26/2021    9:47 AM 11/24/2019    1:46 PM  GAD 7 : Generalized Anxiety Score  Nervous, Anxious, on Edge 0 0 0  Control/stop worrying 0 0 0  Worry too much - different things 0 0 0  Trouble relaxing 0 0 0  Restless 0 0 0  Easily annoyed or irritable 0 0 0  Afraid - awful might happen 0 0 0  Total GAD 7 Score 0 0 0  Anxiety Difficulty Not difficult at all Not difficult at all Not difficult at all    ASSESSMENT/PLAN:  There are no diagnoses linked to this encounter. PDMP reviewed***  No follow-ups on file.  Dana Allan, MD

## 2023-07-13 NOTE — Patient Instructions (Incomplete)
It was a pleasure meeting you today. Thank you for allowing me to take part in your health care.  Our goals for today as we discussed include:  We will get some labs today.  If they are abnormal or we need to do something about them, I will call you.  If they are normal, I will send you a message on MyChart (if it is active) or a letter in the mail.  If you don't hear from [place] in 2 weeks, please call the office at the number below.   This is a list of the screening recommended for you and due dates:  Health Maintenance  Topic Date Due   Zoster (Shingles) Vaccine (1 of 2) 06/07/1997   DTaP/Tdap/Td vaccine (2 - Tdap) 12/09/2018   Flu Shot  04/17/2023   COVID-19 Vaccine (4 - 2023-24 season) 05/18/2023   Colon Cancer Screening  05/21/2023   Medicare Annual Wellness Visit  12/19/2023   Mammogram  07/02/2024   Pneumonia Vaccine  Completed   DEXA scan (bone density measurement)  Completed   Hepatitis C Screening  Completed   HPV Vaccine  Aged Out      If you have any questions or concerns, please do not hesitate to call the office at [phone].  I look forward to our next visit and until then take care and stay safe.  Regards,   Dana Allan, MD   [facility] [place]

## 2023-07-14 ENCOUNTER — Encounter: Payer: Self-pay | Admitting: Family Medicine

## 2023-07-14 ENCOUNTER — Ambulatory Visit: Payer: Medicare PPO | Admitting: Family Medicine

## 2023-07-14 VITALS — BP 120/64 | HR 53 | Temp 97.9°F | Resp 16 | Ht 64.0 in | Wt 120.0 lb

## 2023-07-14 DIAGNOSIS — R110 Nausea: Secondary | ICD-10-CM | POA: Diagnosis not present

## 2023-07-14 DIAGNOSIS — Z8719 Personal history of other diseases of the digestive system: Secondary | ICD-10-CM

## 2023-07-14 DIAGNOSIS — K219 Gastro-esophageal reflux disease without esophagitis: Secondary | ICD-10-CM

## 2023-07-14 DIAGNOSIS — F39 Unspecified mood [affective] disorder: Secondary | ICD-10-CM

## 2023-07-14 DIAGNOSIS — I2510 Atherosclerotic heart disease of native coronary artery without angina pectoris: Secondary | ICD-10-CM

## 2023-07-14 DIAGNOSIS — R8290 Unspecified abnormal findings in urine: Secondary | ICD-10-CM | POA: Diagnosis not present

## 2023-07-14 DIAGNOSIS — E785 Hyperlipidemia, unspecified: Secondary | ICD-10-CM | POA: Diagnosis not present

## 2023-07-14 DIAGNOSIS — R7989 Other specified abnormal findings of blood chemistry: Secondary | ICD-10-CM

## 2023-07-14 DIAGNOSIS — R350 Frequency of micturition: Secondary | ICD-10-CM | POA: Diagnosis not present

## 2023-07-14 DIAGNOSIS — G250 Essential tremor: Secondary | ICD-10-CM

## 2023-07-14 LAB — TSH: TSH: 5.72 u[IU]/mL — ABNORMAL HIGH (ref 0.35–5.50)

## 2023-07-14 LAB — POCT URINALYSIS DIP (CLINITEK)
Blood, UA: NEGATIVE
Glucose, UA: NEGATIVE mg/dL
Ketones, POC UA: NEGATIVE mg/dL
Nitrite, UA: NEGATIVE
Spec Grav, UA: 1.02 (ref 1.010–1.025)
Urobilinogen, UA: 1 U/dL
pH, UA: 7 (ref 5.0–8.0)

## 2023-07-14 MED ORDER — VENLAFAXINE HCL 37.5 MG PO TABS
ORAL_TABLET | ORAL | 3 refills | Status: DC
Start: 2023-07-14 — End: 2024-01-09

## 2023-07-14 MED ORDER — PROPRANOLOL HCL 40 MG PO TABS
40.0000 mg | ORAL_TABLET | Freq: Two times a day (BID) | ORAL | 3 refills | Status: DC
Start: 2023-07-14 — End: 2024-01-08

## 2023-07-14 MED ORDER — ONDANSETRON HCL 4 MG PO TABS: 4.0000 mg | ORAL_TABLET | Freq: Three times a day (TID) | ORAL | 11 refills | Status: DC | PRN

## 2023-07-14 MED ORDER — ESOMEPRAZOLE MAGNESIUM 40 MG PO CPDR
40.0000 mg | DELAYED_RELEASE_CAPSULE | Freq: Two times a day (BID) | ORAL | 3 refills | Status: DC
Start: 2023-07-14 — End: 2024-01-09

## 2023-07-14 MED ORDER — EZETIMIBE 10 MG PO TABS: ORAL_TABLET | ORAL | 3 refills | Status: DC

## 2023-07-14 MED ORDER — URSODIOL 250 MG PO TABS
ORAL_TABLET | ORAL | 3 refills | Status: DC
Start: 2023-07-14 — End: 2024-01-09

## 2023-07-14 MED ORDER — SIMVASTATIN 40 MG PO TABS
ORAL_TABLET | ORAL | 3 refills | Status: DC
Start: 2023-07-14 — End: 2023-11-17

## 2023-07-14 NOTE — Assessment & Plan Note (Signed)
Patient reports increased frequency of urination, particularly at night. No pain with urination reported. -Collect urine sample for urinalysis and culture to rule out infection.

## 2023-07-14 NOTE — Assessment & Plan Note (Signed)
Previous TSH was elevated at 7.11. No new symptoms of hypothyroidism reported, but patient has always been cold-natured. -Check thyroid labs today to assess current status.

## 2023-07-14 NOTE — Assessment & Plan Note (Signed)
Patient reports occasional tremor, more noticeable when tired. Currently managed with Propranolol. -No changes to current management plan.

## 2023-07-14 NOTE — Assessment & Plan Note (Addendum)
Managed with SSRI -Refill Effexor 37.5 mg 2 tabs am and 1 tab pm

## 2023-07-15 LAB — URINE CULTURE
MICRO NUMBER:: 15651462
SPECIMEN QUALITY:: ADEQUATE

## 2023-07-16 ENCOUNTER — Telehealth: Payer: Self-pay

## 2023-07-16 DIAGNOSIS — K8689 Other specified diseases of pancreas: Secondary | ICD-10-CM | POA: Diagnosis not present

## 2023-07-16 NOTE — Telephone Encounter (Signed)
Pt called back and I read the message and she verbalized understanding

## 2023-07-16 NOTE — Telephone Encounter (Signed)
-----   Message from Dana Allan sent at 07/16/2023 10:20 AM EDT ----- No urine infection.

## 2023-07-16 NOTE — Telephone Encounter (Signed)
Lvm for pt to give office a call back

## 2023-08-25 DIAGNOSIS — M810 Age-related osteoporosis without current pathological fracture: Secondary | ICD-10-CM | POA: Diagnosis not present

## 2023-09-04 DIAGNOSIS — M810 Age-related osteoporosis without current pathological fracture: Secondary | ICD-10-CM | POA: Diagnosis not present

## 2023-09-10 ENCOUNTER — Emergency Department: Payer: Medicare PPO

## 2023-09-10 ENCOUNTER — Other Ambulatory Visit: Payer: Self-pay

## 2023-09-10 ENCOUNTER — Emergency Department
Admission: EM | Admit: 2023-09-10 | Discharge: 2023-09-10 | Disposition: A | Discharge status: Home / Self Care | Payer: Medicare PPO | Attending: Emergency Medicine | Admitting: Emergency Medicine

## 2023-09-10 DIAGNOSIS — I10 Essential (primary) hypertension: Secondary | ICD-10-CM | POA: Insufficient documentation

## 2023-09-10 DIAGNOSIS — Z20822 Contact with and (suspected) exposure to covid-19: Secondary | ICD-10-CM | POA: Insufficient documentation

## 2023-09-10 DIAGNOSIS — R0602 Shortness of breath: Secondary | ICD-10-CM | POA: Diagnosis not present

## 2023-09-10 DIAGNOSIS — J069 Acute upper respiratory infection, unspecified: Secondary | ICD-10-CM | POA: Diagnosis not present

## 2023-09-10 DIAGNOSIS — B9789 Other viral agents as the cause of diseases classified elsewhere: Secondary | ICD-10-CM | POA: Diagnosis not present

## 2023-09-10 DIAGNOSIS — R059 Cough, unspecified: Secondary | ICD-10-CM | POA: Diagnosis not present

## 2023-09-10 LAB — CBC WITH DIFFERENTIAL/PLATELET
Abs Immature Granulocytes: 0.03 10*3/uL (ref 0.00–0.07)
Basophils Absolute: 0 10*3/uL (ref 0.0–0.1)
Basophils Relative: 0 %
Eosinophils Absolute: 0.3 10*3/uL (ref 0.0–0.5)
Eosinophils Relative: 4 %
HCT: 35.4 % — ABNORMAL LOW (ref 36.0–46.0)
Hemoglobin: 12.1 g/dL (ref 12.0–15.0)
Immature Granulocytes: 0 %
Lymphocytes Relative: 14 %
Lymphs Abs: 1.1 10*3/uL (ref 0.7–4.0)
MCH: 32.2 pg (ref 26.0–34.0)
MCHC: 34.2 g/dL (ref 30.0–36.0)
MCV: 94.1 fL (ref 80.0–100.0)
Monocytes Absolute: 0.7 10*3/uL (ref 0.1–1.0)
Monocytes Relative: 8 %
Neutro Abs: 5.8 10*3/uL (ref 1.7–7.7)
Neutrophils Relative %: 74 %
Platelets: 238 10*3/uL (ref 150–400)
RBC: 3.76 MIL/uL — ABNORMAL LOW (ref 3.87–5.11)
RDW: 12.1 % (ref 11.5–15.5)
WBC: 7.9 10*3/uL (ref 4.0–10.5)
nRBC: 0 % (ref 0.0–0.2)

## 2023-09-10 LAB — RESP PANEL BY RT-PCR (RSV, FLU A&B, COVID)  RVPGX2
Influenza A by PCR: NEGATIVE
Influenza B by PCR: NEGATIVE
Resp Syncytial Virus by PCR: NEGATIVE
SARS Coronavirus 2 by RT PCR: NEGATIVE

## 2023-09-10 LAB — BASIC METABOLIC PANEL
Anion gap: 7 (ref 5–15)
BUN: 15 mg/dL (ref 8–23)
CO2: 26 mmol/L (ref 22–32)
Calcium: 8.5 mg/dL — ABNORMAL LOW (ref 8.9–10.3)
Chloride: 103 mmol/L (ref 98–111)
Creatinine, Ser: 0.73 mg/dL (ref 0.44–1.00)
GFR, Estimated: >60 mL/min (ref >60)
Glucose, Bld: 106 mg/dL — ABNORMAL HIGH (ref 70–99)
Potassium: 3.6 mmol/L (ref 3.5–5.1)
Sodium: 136 mmol/L (ref 135–145)

## 2023-09-10 NOTE — ED Triage Notes (Signed)
Pt to ED for SOB since last night. States has had runny nose and congestion. Pt states could have been exposed to Covid at work [employer]). Denies lung or cardiac hx. Pt has unlabored respirations.

## 2023-09-10 NOTE — Discharge Instructions (Addendum)
Please return for any worsening difficulty breathing.

## 2023-09-10 NOTE — ED Provider Notes (Signed)
[facility] Provider Note    Event Date/Time   First MD Initiated Contact with Patient 09/10/23 1219     (approximate)   History   Shortness of Breath   HPI Katie [name] is a 77 y.o. female with history of HTN, HLD presenting today for shortness of breath.  Patient states for the past 24 hours she has had intermittent shortness of breath associated with dry cough.  Also had nasal congestion.  Notes COVID exposure at her work.  Otherwise denies chest pain, fever, body aches, nausea, vomiting, abdominal pain, diarrhea, constipation.     Physical Exam   Triage Vital Signs: ED Triage Vitals  Encounter Vitals Group     BP 09/10/23 1207 138/66     Systolic BP Percentile --      Diastolic BP Percentile --      Pulse Rate 09/10/23 1207 71     Resp 09/10/23 1207 20     Temp 09/10/23 1207 97.9 F (36.6 C)     Temp Source 09/10/23 1207 Oral     SpO2 09/10/23 1207 96 %     Weight 09/10/23 1206 120 lb (54.4 kg)     Height 09/10/23 1206 5\' 4"  (1.626 m)     Head Circumference --      Peak Flow --      Pain Score 09/10/23 1204 0     Pain Loc --      Pain Education --      Exclude from Growth Chart --     Most recent vital signs: Vitals:   09/10/23 1207  BP: 138/66  Pulse: 71  Resp: 20  Temp: 97.9 F (36.6 C)  SpO2: 96%   Physical Exam: I have reviewed the vital signs and nursing notes. General: Awake, alert, no acute distress.  Nontoxic appearing. Head:  Atraumatic, normocephalic.   ENT:  EOM intact, PERRL. Oral mucosa is pink and moist with no lesions. Neck: Neck is supple with full range of motion, No meningeal signs. Cardiovascular:  RRR, No murmurs. Peripheral pulses palpable and equal bilaterally. Respiratory:  Symmetrical chest wall expansion.  No rhonchi, rales, or wheezes.  Good air movement throughout.  No use of accessory muscles.   Musculoskeletal:  No cyanosis or edema. Moving extremities with full ROM Abdomen:  Soft, nontender,  nondistended. Neuro:  GCS 15, moving all four extremities, interacting appropriately. Speech clear. Psych:  Calm, appropriate.   Skin:  Warm, dry, no rash.    ED Results / Procedures / Treatments   Labs (all labs ordered are listed, but only abnormal results are displayed) Labs Reviewed  CBC WITH DIFFERENTIAL/PLATELET - Abnormal; Notable for the following components:      Result Value   RBC 3.76 (*)    HCT 35.4 (*)    All other components within normal limits  BASIC METABOLIC PANEL - Abnormal; Notable for the following components:   Glucose, Bld 106 (*)    Calcium 8.5 (*)    All other components within normal limits  RESP PANEL BY RT-PCR (RSV, FLU A&B, COVID)  [id number]     EKG My EKG interpretation: Rate of 68, normal sinus rhythm, normal axis, normal intervals.  No acute ST elevations or depressions   RADIOLOGY Independently interpreted chest x-ray with no acute pathology.   PROCEDURES:  Critical Care performed: No  Procedures   MEDICATIONS ORDERED IN ED: Medications - No data to display   IMPRESSION / MDM / ASSESSMENT AND PLAN /  ED COURSE  I reviewed the triage vital signs and the nursing notes.                              Differential diagnosis includes, but is not limited to, viral URI, pneumonia, pneumothorax  Patient's presentation is most consistent with acute complicated illness / injury requiring diagnostic workup.  Patient is a 77 year old female presenting today for cough, congestion, and shortness of breath.  Physical exam unremarkable with no wheezing or crackles.  Vital signs completely stable with no increased work of breathing or tachypnea.  Laboratory workup unremarkable.  Negative for COVID, flu, RSV.  Chest x-ray negative for pneumonia.  Still suspect likely this is a viral URI with no other concerning features at this time.  Recommended symptomatic management and hydration at home.  Follow-up with PCP as needed.  Given strict return precautions  for worsening breathing symptoms.  The patient is on the cardiac monitor to evaluate for evidence of arrhythmia and/or significant heart rate changes.     FINAL CLINICAL IMPRESSION(S) / ED DIAGNOSES   Final diagnoses:  Viral URI with cough     Rx / DC Orders   ED Discharge Orders     None        Note:  This document was prepared using Dragon voice recognition software and may include unintentional dictation errors.   Janith Lima, MD 09/10/23 [phone]

## 2023-09-22 ENCOUNTER — Telehealth: Payer: Self-pay

## 2023-09-22 NOTE — Progress Notes (Signed)
 Transition Care Management Follow-up Telephone Call Date of discharge and from where: 09/10/2023 [facility] How have you been since you were released from the hospital? Patient stated she is feeling better and the cough has improved. Any questions or concerns? No  Items Reviewed: Did the pt receive and understand the discharge instructions provided? Yes  Medications obtained and verified?  Patient is taking OTC Mucinex. Other? No  Any new allergies since your discharge? No  Dietary orders reviewed? Yes Do you have support at home? Yes   Follow up appointments reviewed:  PCP Hospital f/u appt confirmed?  Patient stated she plans to follow-up with her PCP this week.  Scheduled to see  on  @ . Specialist Hospital f/u appt confirmed? No  Scheduled to see  on  @ . Are transportation arrangements needed? No  If their condition worsens, is the pt aware to call PCP or go to the Emergency Dept.? Yes Was the patient provided with contact information for the PCP's office or ED? Yes Was to pt encouraged to call back with questions or concerns? Yes   Katie [name] [facility]  [facility], [facility] Guide Direct Dial: [phone]  Website: [email or web address]

## 2023-09-23 DIAGNOSIS — H353134 Nonexudative age-related macular degeneration, bilateral, advanced atrophic with subfoveal involvement: Secondary | ICD-10-CM | POA: Diagnosis not present

## 2023-09-23 DIAGNOSIS — Z0100 Encounter for examination of eyes and vision without abnormal findings: Secondary | ICD-10-CM | POA: Diagnosis not present

## 2023-10-27 DIAGNOSIS — J101 Influenza due to other identified influenza virus with other respiratory manifestations: Secondary | ICD-10-CM | POA: Diagnosis not present

## 2023-10-27 DIAGNOSIS — R519 Headache, unspecified: Secondary | ICD-10-CM | POA: Diagnosis not present

## 2023-10-27 DIAGNOSIS — Z20822 Contact with and (suspected) exposure to covid-19: Secondary | ICD-10-CM | POA: Diagnosis not present

## 2023-11-17 ENCOUNTER — Other Ambulatory Visit: Payer: Self-pay | Admitting: Family Medicine

## 2023-11-17 DIAGNOSIS — E785 Hyperlipidemia, unspecified: Secondary | ICD-10-CM

## 2023-11-18 DIAGNOSIS — Z7982 Long term (current) use of aspirin: Secondary | ICD-10-CM | POA: Diagnosis not present

## 2023-11-18 DIAGNOSIS — Z860101 Personal history of adenomatous and serrated colon polyps: Secondary | ICD-10-CM | POA: Diagnosis not present

## 2023-11-18 DIAGNOSIS — E7800 Pure hypercholesterolemia, unspecified: Secondary | ICD-10-CM | POA: Diagnosis not present

## 2023-11-18 DIAGNOSIS — Z860100 Personal history of colon polyps, unspecified: Secondary | ICD-10-CM | POA: Diagnosis not present

## 2023-11-18 DIAGNOSIS — I10 Essential (primary) hypertension: Secondary | ICD-10-CM | POA: Diagnosis not present

## 2023-11-18 DIAGNOSIS — Z09 Encounter for follow-up examination after completed treatment for conditions other than malignant neoplasm: Secondary | ICD-10-CM | POA: Diagnosis not present

## 2023-11-18 DIAGNOSIS — K648 Other hemorrhoids: Secondary | ICD-10-CM | POA: Diagnosis not present

## 2023-11-18 DIAGNOSIS — Z79899 Other long term (current) drug therapy: Secondary | ICD-10-CM | POA: Diagnosis not present

## 2023-11-18 DIAGNOSIS — K219 Gastro-esophageal reflux disease without esophagitis: Secondary | ICD-10-CM | POA: Diagnosis not present

## 2023-11-18 DIAGNOSIS — Z1211 Encounter for screening for malignant neoplasm of colon: Secondary | ICD-10-CM | POA: Diagnosis not present

## 2023-12-26 ENCOUNTER — Ambulatory Visit: Payer: Medicare PPO | Admitting: *Deleted

## 2023-12-26 VITALS — Ht 64.0 in | Wt 115.0 lb

## 2023-12-26 DIAGNOSIS — Z0000 Encounter for general adult medical examination without abnormal findings: Secondary | ICD-10-CM | POA: Diagnosis not present

## 2023-12-26 NOTE — Progress Notes (Signed)
 Subjective:   Katie [name] is a 78 y.o. who presents for a Medicare Wellness preventive visit.  Visit Complete: Virtual I connected with  Katie [name] on 12/26/23 by a audio enabled telemedicine application and verified that I am speaking with the correct person using two identifiers.  Patient Location: Home  Provider Location: Home Office  I discussed the limitations of evaluation and management by telemedicine. The patient expressed understanding and agreed to proceed.  Vital Signs: Because this visit was a virtual/telehealth visit, some criteria may be missing or patient reported. Any vitals not documented were not able to be obtained and vitals that have been documented are patient reported.  VideoDeclined- This patient declined Librarian, academic. Therefore the visit was completed with audio only.  Persons Participating in Visit: Patient.  AWV Questionnaire: No: Patient Medicare AWV questionnaire was not completed prior to this visit.  Cardiac Risk Factors include: advanced age (>77men, >34 women);dyslipidemia;Other (see comment), Risk factor comments: CAD     Objective:    Today's Vitals   12/26/23 0933  Weight: 115 lb (52.2 kg)  Height: 5\' 4"  (1.626 m)   Body mass index is 19.74 kg/m.     12/26/2023    9:45 AM 09/10/2023   12:05 PM 12/19/2022    3:59 PM 12/13/2021    3:39 PM 05/05/2020    9:45 AM 03/31/2017    9:19 AM 04/19/2016    9:34 AM  Advanced Directives  Does Patient Have a Medical Advance Directive? Yes No No No -- No No  Type of Estate agent of [place];Living will        Does patient want to make changes to medical advance directive?     No - Patient declined    Copy of Healthcare Power of Attorney in Chart? No - copy requested        Would patient like information on creating a medical advance directive?   Yes (ED - Information included in AVS) No - Patient declined  No - Patient declined Yes - Educational  materials given    Current Medications (verified) Outpatient Encounter Medications as of 12/26/2023  Medication Sig   Biotin 1 MG CAPS Take 1 mg by mouth daily.    CHOLECALCIFEROL PO Take by mouth daily.   denosumab (PROLIA) 60 MG/ML SOSY injection Inject 60 mg into the skin every 6 (six) months.   esomeprazole (NEXIUM) 40 MG capsule Take 1 capsule (40 mg total) by mouth 2 (two) times daily before a meal. 30 minutes before food   ezetimibe (ZETIA) 10 MG tablet TAKE 1 TABLET(10 MG) BY MOUTH DAILY   Ferrous Sulfate (IRON PO) Take 1 tablet by mouth every other day. OTC   isosorbide mononitrate (IMDUR) 30 MG 24 hr tablet TAKE 1/2 TABLET(15 MG) BY MOUTH DAILY   Multiple Vitamins-Minerals (PRESERVISION AREDS) CAPS Take by mouth 2 (two) times daily.   ondansetron (ZOFRAN) 4 MG tablet Take 1 tablet (4 mg total) by mouth every 8 (eight) hours as needed for nausea or vomiting.   propranolol (INDERAL) 40 MG tablet Take 1 tablet (40 mg total) by mouth 2 (two) times daily.   simvastatin (ZOCOR) 40 MG tablet TAKE 1 TABLET(40 MG) BY MOUTH DAILY AT 6 PM   ursodiol (ACTIGALL) 250 MG tablet Take one three times a day   venlafaxine (EFFEXOR) 37.5 MG tablet TAKE 2 TABLETS BY MOUTH EVERY MORNING AND 1 TABLET EVERY EVENING   No facility-administered encounter medications on file  as of 12/26/2023.    Allergies (verified) Lipitor [atorvastatin calcium]   History: Past Medical History:  Diagnosis Date   Acute recurrent sinusitis 02/15/2022   ANEMIA, PERNICIOUS 03/13/2007   Qualifier: Diagnosis of   By: Pasty Arch LPN, Riel Hirschman       Anxiety    Anxiety 06/16/2019   Atypical chest pain 09/04/2020   Benign head tremor 11/08/2013   Bile duct stricture    BILE DUCT STRICTURE 04/26/2008   Qualifier: Diagnosis of   By: Fleet Contras LPN, Deborah       Breast mass, left    Breast pain, left 01/02/2021   Calculus of bile duct 04/26/2008   Qualifier: Diagnosis of   By: Fleet Contras LPN, Deborah       Choledocholithiasis     recurrent   Constipation 02/08/2022   COVID-19    09/30/20, 10/07/20    Depression 02/13/1995   Long history of this.    GERD (gastroesophageal reflux disease)    Hypertension    Migraine without aura, without mention of intractable migraine without mention of status migrainosus 01/03/2014   Rapid heart beat    Rotaviral gastroenteritis    12/2020   Past Surgical History:  Procedure Laterality Date   ABDOMINAL HYSTERECTOMY     BLADDER SURGERY     x 2   BREAST EXCISIONAL BIOPSY Right 2003   benign   BREAST LUMPECTOMY     right   BREAST LUMPECTOMY WITH RADIOACTIVE SEED LOCALIZATION Left 03/31/2017   Procedure: LEFT BREAST LUMPECTOMY WITH RADIOACTIVE SEED LOCALIZATION;  Surgeon: Abigail Miyamoto, MD;  Location: [facility] OR;  Service: General;  Laterality: Left;   CHOLECYSTECTOMY     COLONOSCOPY     CYSTOSCOPY     normal 03/18/22   Explor Lap, Hepatoduodenostomy  07/23/2011   Dr Madaline Brilliant, [facility]   FOOT SURGERY     both feet   GASTRIC BYPASS  05/04/2013   PARTIAL HYSTERECTOMY     SKIN BIOPSY     skin bx RUB benign   TUBAL LIGATION     VEIN LIGATION AND STRIPPING     left leg   WRIST SURGERY     right   Family History  Problem Relation Age of Onset   Cancer Mother        lung, smoker   Alcohol abuse Father    GER disease Sister    Heart attack Sister    Depression Sister    Anxiety disorder Sister    Hypertension Sister    GER disease Brother    Migraines Neg Hx    Breast cancer Neg Hx    Social History   Socioeconomic History   Marital status: Married    Spouse name: Not on file   Number of children: 2   Years of education: college   Highest education level: Not on file  Occupational History   Occupation: retired    Associate Professor: RETIRED  Tobacco Use   Smoking status: Never    Passive exposure: Never   Smokeless tobacco: Never  Vaping Use   Vaping status: Never Used  Substance and Sexual Activity   Alcohol use: No   Drug use: No   Sexual activity: Yes  Other  Topics Concern   Not on file  Social History Narrative   Does not have a living will or HPOA.   Desires CPR.   Does not want prolonged life support.   Social Drivers of Health   Financial Resource Strain: Low Risk  (12/26/2023)  Overall Financial Resource Strain (CARDIA)    Difficulty of Paying Living Expenses: Not hard at all  Food Insecurity: No Food Insecurity (12/26/2023)   Hunger Vital Sign    Worried About Running Out of Food in the Last Year: Never true    Ran Out of Food in the Last Year: Never true  Transportation Needs: No Transportation Needs (12/26/2023)   PRAPARE - Administrator, Civil Service (Medical): No    Lack of Transportation (Non-Medical): No  Physical Activity: Inactive (12/26/2023)   Exercise Vital Sign    Days of Exercise per Week: 0 days    Minutes of Exercise per Session: 0 min  Stress: No Stress Concern Present (12/26/2023)   [employer] of Occupational Health - Occupational Stress Questionnaire    Feeling of Stress : Only a little  Social Connections: Socially Integrated (12/26/2023)   Social Connection and Isolation Panel [NHANES]    Frequency of Communication with Friends and Family: More than three times a week    Frequency of Social Gatherings with Friends and Family: More than three times a week    Attends Religious Services: More than 4 times per year    Active Member of [employer] or Organizations: Yes    Attends Engineer, structural: More than 4 times per year    Marital Status: Married    Tobacco Counseling Counseling given: Not Answered    Clinical Intake:  Pre-visit preparation completed: Yes  Pain : No/denies pain     BMI - recorded: 19.74 Nutritional Status: BMI of 19-24  Normal Nutritional Risks: None Diabetes: No  Lab Results  Component Value Date   HGBA1C 5.6 12/26/2022   HGBA1C 5.4 09/10/2018   HGBA1C 5.5 12/12/2015     How often do you need to have someone help you when you read instructions,  pamphlets, or other written materials from your doctor or pharmacy?: 1 - Never  Interpreter Needed?: No  Information entered by :: R. Olanda Downie LPN   Activities of Daily Living     12/26/2023    9:35 AM  In your present state of health, do you have any difficulty performing the following activities:  Hearing? 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 0  Comment glasses  Difficulty concentrating or making decisions? 0  Walking or climbing stairs? 0  Dressing or bathing? 0  Doing errands, shopping? 0  Preparing Food and eating ? N  Using the Toilet? N  In the past six months, have you accidently leaked urine? N  Do you have problems with loss of bowel control? N  Managing your Medications? N  Managing your Finances? N  Housekeeping or managing your Housekeeping? N    Patient Care Team: Dana Allan, MD as PCP - General (Family Medicine) Iran Ouch, MD as PCP - Cardiology (Cardiology) York Spaniel, MD (Inactive) as Consulting Physician (Neurology) Venancio Poisson, MD as Consulting Physician (Dermatology) Richarda Overlie, MD as Consulting Physician (Obstetrics and Gynecology) Blair Promise, OD as Consulting Physician (Optometry)  Indicate any recent Medical Services you may have received from other than [facility] providers in the past year (date may be approximate).     Assessment:   This is a routine wellness examination for Katie [name].  Hearing/Vision screen Hearing Screening - Comments:: Deaf in left ear Vision Screening - Comments:: glasses   Goals Addressed             This Visit's Progress    Patient Stated  Wants to start a regular exercise program       Depression Screen     12/26/2023    9:41 AM 07/14/2023    9:46 AM 01/10/2023   10:05 AM 12/19/2022    3:53 PM 10/30/2022    1:11 PM 03/29/2022    9:33 AM 02/15/2022    4:33 PM  PHQ 2/9 Scores  PHQ - 2 Score 0 0 0 0 0 0 0  PHQ- 9 Score 0 0 0    0    Fall Risk     12/26/2023    9:38 AM 07/14/2023     9:46 AM 12/19/2022    3:53 PM 10/30/2022    1:11 PM 03/29/2022    9:33 AM  Fall Risk   Falls in the past year? 0 0 0 0 0  Number falls in past yr: 0 0 0 0   Injury with Fall? 0 0 0 0   Risk for fall due to : No Fall Risks No Fall Risks No Fall Risks No Fall Risks   Follow up Falls prevention discussed;Falls evaluation completed Falls evaluation completed  Falls evaluation completed     MEDICARE RISK AT HOME:  Medicare Risk at Home Any stairs in or around the home?: Yes If so, are there any without handrails?: No Home free of loose throw rugs in walkways, pet beds, electrical cords, etc?: Yes Adequate lighting in your home to reduce risk of falls?: Yes Life alert?: No Use of a cane, walker or w/c?: No Grab bars in the bathroom?: Yes Shower chair or bench in shower?: Yes Elevated toilet seat or a handicapped toilet?: No  TIMED UP AND GO:  Was the test performed?  No  Cognitive Function: 6CIT completed    04/19/2016    9:38 AM  MMSE - Mini Mental State Exam  Orientation to time 5  Orientation to Place 5  Registration 3  Attention/ Calculation 0  Recall 3  Language- name 2 objects 0  Language- repeat 1  Language- follow 3 step command 3  Language- read & follow direction 0  Write a sentence 0  Copy design 0  Total score 20        12/26/2023    9:46 AM 12/19/2022    3:58 PM 05/05/2020    9:44 AM  6CIT Screen  What Year? 0 points  0 points  What month? 0 points  0 points  What time? 0 points 0 points 0 points  Count back from 20 0 points 0 points   Months in reverse 0 points 0 points   Repeat phrase 0 points 0 points   Total Score 0 points      Immunizations Immunization History  Administered Date(s) Administered   Fluad Quad(high Dose 65+) 07/27/2021, 06/30/2023   Influenza, High Dose Seasonal PF 06/16/2020   Influenza,inj,Quad PF,6+ Mos 08/02/2015   Influenza-Unspecified 06/24/2014, 06/17/2019, 06/16/2022   Moderna Sars-Covid-2 Vaccination 09/27/2019,  10/25/2019, 08/30/2020   Pneumococcal Conjugate-13 08/24/2014   Pneumococcal Polysaccharide-23 04/19/2016   Td 12/08/2008   Zoster, Live 07/19/2015    Screening Tests Health Maintenance  Topic Date Due   Zoster Vaccines- Shingrix (1 of 2) 06/07/1966   DTaP/Tdap/Td (2 - Tdap) 12/09/2018   COVID-19 Vaccine (4 - 2024-25 season) 05/18/2023   Colonoscopy  05/21/2023   INFLUENZA VACCINE  04/16/2024   MAMMOGRAM  07/02/2024   Medicare Annual Wellness (AWV)  12/25/2024   Pneumonia Vaccine 14+ Years old  Completed   DEXA SCAN  Completed   Hepatitis C Screening  Completed   HPV VACCINES  Aged Out   Meningococcal B Vaccine  Aged Out    Health Maintenance  Health Maintenance Due  Topic Date Due   Zoster Vaccines- Shingrix (1 of 2) 06/07/1966   DTaP/Tdap/Td (2 - Tdap) 12/09/2018   COVID-19 Vaccine (4 - 2024-25 season) 05/18/2023   Colonoscopy  05/21/2023   Health Maintenance Items Addressed: Patient stated that she recently had a colonoscopy at [facility] and no longer needs any because of age.    Discussed with patient the need to update her tetanus (Tdap) and shingles. Patient declines covid vaccines.  Additional Screening:  Vision Screening: Recommended annual ophthalmology exams for early detection of glaucoma and other disorders of the eye.Up to date [facility]  Dental Screening: Recommended annual dental exams for proper oral hygiene  Community Resource Referral / Chronic Care Management: CRR required this visit?  No   CCM required this visit?  No     Plan:     I have personally reviewed and noted the following in the patient's chart:   Medical and social history Use of alcohol, tobacco or illicit drugs  Current medications and supplements including opioid prescriptions. Patient is not currently taking opioid prescriptions. Functional ability and status Nutritional status Physical activity Advanced directives List of other physicians Hospitalizations, surgeries,  and ER visits in previous 12 months Vitals Screenings to include cognitive, depression, and falls Referrals and appointments  In addition, I have reviewed and discussed with patient certain preventive protocols, quality metrics, and best practice recommendations. A written personalized care plan for preventive services as well as general preventive health recommendations were provided to patient.     Sydell Axon, LPN   1/61/0960   After Visit Summary: (MyChart) Due to this being a telephonic visit, the after visit summary with patients personalized plan was offered to patient via MyChart   Notes: Nothing significant to report at this time.

## 2023-12-26 NOTE — Patient Instructions (Signed)
 Ms. Brodhead , Thank you for taking time to come for your Medicare Wellness Visit. I appreciate your ongoing commitment to your health goals. Please review the following plan we discussed and let me know if I can assist you in the future.   Referrals/Orders/Follow-Ups/Clinician Recommendations: Remember to update your tetanus (Tdap) and shingles vaccines. Unable to locate notes of your recent colonoscopy. Please bring any notes that you may have regarding the colonoscopy to your next visit.  This is a list of the screening recommended for you and due dates:  Health Maintenance  Topic Date Due   Zoster (Shingles) Vaccine (1 of 2) 06/07/1966   DTaP/Tdap/Td vaccine (2 - Tdap) 12/09/2018   COVID-19 Vaccine (4 - 2024-25 season) 05/18/2023   Colon Cancer Screening  05/21/2023   Flu Shot  04/16/2024   Mammogram  07/02/2024   Medicare Annual Wellness Visit  12/25/2024   Pneumonia Vaccine  Completed   DEXA scan (bone density measurement)  Completed   Hepatitis C Screening  Completed   HPV Vaccine  Aged Out   Meningitis B Vaccine  Aged Out    Advanced directives: (Copy Requested) Please bring a copy of your health care power of attorney and living will to the office to be added to your chart at your convenience. You can mail to [facility] [address] [place], [place] [address] or email to [email or web address]  Next Medicare Annual Wellness Visit scheduled for next year: Yes 12/28/24 @ 2:20

## 2024-01-06 NOTE — Progress Notes (Unsigned)
 Cardiology Clinic Note   Date: 01/08/2024 ID: Inette, Doubrava [birth year], MRN [id number]  Primary Cardiologist:  Antionette Kirks, MD  Chief Complaint   Katie [name] is a 78 y.o. female who presents to the clinic today for routine follow up.   Patient Profile   Katie [name] is followed by Dr. Alvenia Aus for the history outlined below.      Past medical history significant for: CAD.  Coronary CTA with FFR 11/01/2021: Coronary calcium score 32.7 (47th percentile).  Calcified plaque causing moderate mid LAD stenosis.  FFR analysis showed significant nonfocal stenosis in the mid LAD.  Recommend antianginal medication.  Consider cardiac catheterization if symptoms persist despite medical therapy. Mitral valve regurgitation. Echo 11/15/2021: EF 60 to 65%.  No RWMA.  Grade I DD.  Normal RV size/function.  Mild to moderate MR.  Aortic valve sclerosis without stenosis. Palpitations. Hypertension. Hyperlipidemia. Lipid panel 11/28/2022: Direct LDL 116, HDL 69, TG 136, total 199. GERD. Obesity. S/p gastric bypass.  In summary, patient was first evaluated by Dr. Alvenia Aus in December 2021 for pain in the left breast area.  She reported a history of palpitations controlled by propranolol .  She reported a history of bilateral breast surgery with soreness in the left breast over the preceding several months.  Pain was not exertional.  The area was tender to palpation.  EKG without ischemic changes.  Cardiac chest pain was felt to be unlikely and she was advised to follow-up with PCP.  She was evaluated in February 2023 for sharp chest pain occurring in the center of her chest while driving and accompanied by diaphoresis.  She continued to describe an ache in her left breast that was unchanged from previous.  Coronary CTA demonstrated calcium score of 32.7 with moderate stenosis of the LAD as detailed above.  She was started on isosorbide  15 mg.  Echo demonstrated normal LV/RV function as detailed  above.  Patient was last seen in the office by Varney Gentleman, PA-C on 05/26/2023 for routine follow-up.  She was doing well at that time with mild bilateral ankle edema occurring when on her feet for extended periods of time and improving with elevation.  No medication changes were made.     History of Present Illness    Today, patient is here alone. She reports she is doing well. Patient denies shortness of breath, dyspnea on exertion, lower extremity edema, orthopnea or PND.  She reports an occasional "twinge" of central chest pain that is very brief and resolves on its own typically occurring when she is very tired. She denies sustained chest pain. She also reports an occasional fluttering sensation at night when she is very tired. 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 She does not follow a routine exercise program but "I am always on my feet doing stuff."  Patient reports developing night sweats. She does not have any other symptoms she will wake up with her sleeping gown wet. She has a visit coming up with PCP and will mention this.      ROS: All other systems reviewed and are otherwise negative except as noted in History of Present Illness.  EKGs/Labs Reviewed    EKG Interpretation Date/Time:  Thursday January 08 2024 14:29:30 EDT Ventricular Rate:  56 PR Interval:  160 QRS Duration:  84 QT Interval:  428 QTC Calculation: 413 R Axis:   67  Text Interpretation: Sinus bradycardia Nonspecific T wave abnormality When compared with ECG of  10-Sep-2023 12:09, No signficiant change Reconfirmed by Morey Ar [phone]) on 01/08/2024 2:56:17 PM   09/10/2023: BUN 15; Creatinine, Ser 0.73; Potassium 3.6; Sodium 136   09/10/2023: Hemoglobin 12.1; WBC 7.9   07/14/2023: TSH 5.72   Physical Exam    VS:  BP 128/62   Pulse 63   Ht 5\' 4"  (1.626 m)   Wt 117 lb 9.6 oz (53.3 kg)   SpO2 95%   BMI 20.19 kg/m  , BMI Body mass index is 20.19 kg/m.  GEN: Well nourished, well  developed, in no acute distress. Neck: No JVD or carotid bruits. Cardiac:  RRR. 1/6 systolic murmur. No rubs or gallops.   Respiratory:  Respirations regular and unlabored. Clear to auscultation without rales, wheezing or rhonchi. GI: Soft, nontender, nondistended. Extremities: Radials/DP/PT 2+ and equal bilaterally. No clubbing or cyanosis. No edema.  Skin: Warm and dry, no rash. Neuro: Strength intact.  Assessment & Plan   CAD Coronary CTA with FFR February 2023 demonstrated coronary calcium score of 32.7, moderate mid LAD stenosis, FFR demonstrated significant nonfocal stenosis in mid LAD.  Patient reports an occasional "twinge" of central chest pain lasting a very brief moment and resolving on its own typically occurring at night. Discussed the possibility of further evaluating with [facility]. She is not interested at this time stating that she does not find these episodes very bothersome. EKG without acute changes today.  - Continue propranolol , simvastatin , Zetia , isosorbide .  Mitral valve regurgitation Echo March 2023 demonstrated mild to moderate MR.  Patient denies shortness of breath, DOE, lower extremity edema, lightheadedness, presyncope or syncope. 1/6 systolic murmur on exam today. In the absence of symptoms, patient would like to defer echo at this time.  - Defer echo. Discussed contacting the office if she develops any symptoms and she agrees.   Palpitations Patient reports an occasional fluttering sensation in her chest typically at night. RRR on exam today.  - Continue propranolol .  Hypertension BP today 128/62. No report of headaches or dizziness.  - Continue propranolol , isosorbide .  Hyperlipidemia LDL 116 March 2024, not at goal.  Patient declined changing from simvastatin  to rosuvastatin. Patient reports upcoming visit with PCP and will have labs then.  - Continue simvastatin  and ezetimibe .  Disposition: Return in 6 months or sooner as needed.           Signed, Lonell Rives. Natilie Krabbenhoft, DNP, NP-C

## 2024-01-08 ENCOUNTER — Encounter: Payer: Self-pay | Admitting: Student

## 2024-01-08 ENCOUNTER — Ambulatory Visit: Attending: Student | Admitting: Student

## 2024-01-08 ENCOUNTER — Telehealth: Payer: Self-pay

## 2024-01-08 VITALS — BP 128/62 | HR 63 | Ht 64.0 in | Wt 117.6 lb

## 2024-01-08 DIAGNOSIS — I10 Essential (primary) hypertension: Secondary | ICD-10-CM | POA: Diagnosis not present

## 2024-01-08 DIAGNOSIS — I340 Nonrheumatic mitral (valve) insufficiency: Secondary | ICD-10-CM

## 2024-01-08 DIAGNOSIS — R002 Palpitations: Secondary | ICD-10-CM

## 2024-01-08 DIAGNOSIS — E785 Hyperlipidemia, unspecified: Secondary | ICD-10-CM

## 2024-01-08 DIAGNOSIS — I2510 Atherosclerotic heart disease of native coronary artery without angina pectoris: Secondary | ICD-10-CM

## 2024-01-08 MED ORDER — PROPRANOLOL HCL 40 MG PO TABS: 40.0000 mg | ORAL_TABLET | Freq: Two times a day (BID) | ORAL | 3 refills | Status: AC

## 2024-01-08 MED ORDER — ISOSORBIDE MONONITRATE ER 30 MG PO TB24: 15.0000 mg | ORAL_TABLET | Freq: Every day | ORAL | 3 refills | Status: DC

## 2024-01-08 NOTE — Telephone Encounter (Signed)
 Noted.

## 2024-01-08 NOTE — Patient Instructions (Signed)
 Medication Instructions:  Your Physician recommend you continue on your current medication as directed.    *If you need a refill on your cardiac medications before your next appointment, please call your pharmacy*  Lab Work: None ordered at this time   Follow-Up: At [facility], you and your health needs are our priority.  As part of our continuing mission to provide you with exceptional heart care, our providers are all part of one team.  This team includes your primary Cardiologist (physician) and Advanced Practice Providers or APPs (Physician Assistants and Nurse Practitioners) who all work together to provide you with the care you need, when you need it.  Your next appointment:   6 month(s)  Provider:   You may see Antionette Kirks, MD or Morey Ar, NP

## 2024-01-08 NOTE — Telephone Encounter (Signed)
 I spoke with patient via E2C2 and she was asking if she can have her labs drawn at [facility].  I see under Active Requests that there are lab orders from Dr. Valli Gaw with the "Class" listed as "Lab Collect", which means she can have them drawn at [facility].  In patient's Chart Review, I see labs listed as "Pending".  I was unable to reach [employer], CMA, at the time of the call.  I spoke with Thressa Flora in the lab for clarification.  Jenette Mitchell states the labs listed as "Pending" are waiting for Dr. Freeda Jerry signature and since patient has an appointment with Dr. Sueanne Emerald tomorrow morning, she may be waiting to sign until after she sees patient.  I spoke with patient and relayed the information.  Patient states she will fast tomorrow morning so she can have her labs drawn at our office when she comes to see Dr. Sueanne Emerald.

## 2024-01-09 ENCOUNTER — Ambulatory Visit: Admitting: Family Medicine

## 2024-01-09 ENCOUNTER — Encounter: Payer: Self-pay | Admitting: Family Medicine

## 2024-01-09 VITALS — BP 122/64 | HR 52 | Temp 98.0°F | Resp 17 | Ht 64.0 in | Wt 116.2 lb

## 2024-01-09 DIAGNOSIS — E785 Hyperlipidemia, unspecified: Secondary | ICD-10-CM

## 2024-01-09 DIAGNOSIS — F39 Unspecified mood [affective] disorder: Secondary | ICD-10-CM | POA: Diagnosis not present

## 2024-01-09 DIAGNOSIS — R61 Generalized hyperhidrosis: Secondary | ICD-10-CM

## 2024-01-09 DIAGNOSIS — R351 Nocturia: Secondary | ICD-10-CM

## 2024-01-09 DIAGNOSIS — E7800 Pure hypercholesterolemia, unspecified: Secondary | ICD-10-CM

## 2024-01-09 DIAGNOSIS — I700 Atherosclerosis of aorta: Secondary | ICD-10-CM

## 2024-01-09 DIAGNOSIS — K219 Gastro-esophageal reflux disease without esophagitis: Secondary | ICD-10-CM

## 2024-01-09 DIAGNOSIS — D508 Other iron deficiency anemias: Secondary | ICD-10-CM | POA: Diagnosis not present

## 2024-01-09 DIAGNOSIS — R350 Frequency of micturition: Secondary | ICD-10-CM

## 2024-01-09 DIAGNOSIS — E559 Vitamin D deficiency, unspecified: Secondary | ICD-10-CM

## 2024-01-09 DIAGNOSIS — I2510 Atherosclerotic heart disease of native coronary artery without angina pectoris: Secondary | ICD-10-CM | POA: Diagnosis not present

## 2024-01-09 DIAGNOSIS — R7989 Other specified abnormal findings of blood chemistry: Secondary | ICD-10-CM

## 2024-01-09 DIAGNOSIS — Z8719 Personal history of other diseases of the digestive system: Secondary | ICD-10-CM

## 2024-01-09 LAB — POC URINALSYSI DIPSTICK (AUTOMATED)
Bilirubin, UA: NEGATIVE
Blood, UA: NEGATIVE
Glucose, UA: NEGATIVE
Ketones, UA: NEGATIVE
Leukocytes, UA: NEGATIVE
Nitrite, UA: NEGATIVE
Protein, UA: NEGATIVE
Spec Grav, UA: 1.015 (ref 1.010–1.025)
Urobilinogen, UA: 1 U/dL
pH, UA: 7.5 (ref 5.0–8.0)

## 2024-01-09 LAB — COMPREHENSIVE METABOLIC PANEL WITH GFR
ALT: 12 U/L (ref 0–35)
AST: 13 U/L (ref 0–37)
Albumin: 4.1 g/dL (ref 3.5–5.2)
Alkaline Phosphatase: 40 U/L (ref 39–117)
BUN: 11 mg/dL (ref 6–23)
CO2: 30 meq/L (ref 19–32)
Calcium: 8.9 mg/dL (ref 8.4–10.5)
Chloride: 99 meq/L (ref 96–112)
Creatinine, Ser: 0.64 mg/dL (ref 0.40–1.20)
GFR: 85.74 mL/min (ref >60.00)
Glucose, Bld: 96 mg/dL (ref 70–99)
Potassium: 5.1 meq/L (ref 3.5–5.1)
Sodium: 133 meq/L — ABNORMAL LOW (ref 135–145)
Total Bilirubin: 0.6 mg/dL (ref 0.2–1.2)
Total Protein: 6.5 g/dL (ref 6.0–8.3)

## 2024-01-09 LAB — LIPID PANEL
Cholesterol: 196 mg/dL (ref 0–200)
HDL: 80 mg/dL (ref >39.00)
LDL Cholesterol: 88 mg/dL (ref 0–99)
NonHDL: 116.33
Total CHOL/HDL Ratio: 2
Triglycerides: 143 mg/dL (ref 0.0–149.0)
VLDL: 28.6 mg/dL (ref 0.0–40.0)

## 2024-01-09 LAB — CBC WITH DIFFERENTIAL/PLATELET
Basophils Absolute: 0 10*3/uL (ref 0.0–0.1)
Basophils Relative: 0.8 % (ref 0.0–3.0)
Eosinophils Absolute: 0.2 10*3/uL (ref 0.0–0.7)
Eosinophils Relative: 4.2 % (ref 0.0–5.0)
HCT: 36.2 % (ref 36.0–46.0)
Hemoglobin: 12.5 g/dL (ref 12.0–15.0)
Lymphocytes Relative: 24.1 % (ref 12.0–46.0)
Lymphs Abs: 1 10*3/uL (ref 0.7–4.0)
MCHC: 34.5 g/dL (ref 30.0–36.0)
MCV: 92 fl (ref 78.0–100.0)
Monocytes Absolute: 0.4 10*3/uL (ref 0.1–1.0)
Monocytes Relative: 8.4 % (ref 3.0–12.0)
Neutro Abs: 2.7 10*3/uL (ref 1.4–7.7)
Neutrophils Relative %: 62.5 % (ref 43.0–77.0)
Platelets: 247 10*3/uL (ref 150.0–400.0)
RBC: 3.94 Mil/uL (ref 3.87–5.11)
RDW: 12.4 % (ref 11.5–15.5)
WBC: 4.3 10*3/uL (ref 4.0–10.5)

## 2024-01-09 LAB — TSH: TSH: 7.2 u[IU]/mL — ABNORMAL HIGH (ref 0.35–5.50)

## 2024-01-09 LAB — VITAMIN D 25 HYDROXY (VIT D DEFICIENCY, FRACTURES): VITD: 33.55 ng/mL (ref 30.00–100.00)

## 2024-01-09 MED ORDER — ESOMEPRAZOLE MAGNESIUM 40 MG PO CPDR: 40.0000 mg | DELAYED_RELEASE_CAPSULE | Freq: Two times a day (BID) | ORAL | 3 refills | Status: DC

## 2024-01-09 MED ORDER — VENLAFAXINE HCL 37.5 MG PO TABS: ORAL_TABLET | ORAL | 3 refills | Status: DC

## 2024-01-09 MED ORDER — URSODIOL 250 MG PO TABS: ORAL_TABLET | ORAL | 3 refills | Status: DC

## 2024-01-09 MED ORDER — SIMVASTATIN 40 MG PO TABS: ORAL_TABLET | ORAL | 3 refills | Status: DC

## 2024-01-09 NOTE — Progress Notes (Signed)
 Noted.

## 2024-01-09 NOTE — Progress Notes (Signed)
 SUBJECTIVE:   Chief Complaint  Patient presents with   Medication Management   Urinary Frequency    X 2-3 days- no burning or pressure. No odor   HPI Presents for follow up chronic disease management  Discussed the use of AI scribe software for clinical note transcription with the patient, who gave verbal consent to proceed.  History of Present Illness Katie [name] is a 78 year old female who presents for a follow-up visit and medication refill.  She is currently taking omeprazole, Zocor , uridazole, Imdur , and propranolol . Her Prolia injection is not due yet as she last received it in the fall.  She experiences nocturia, waking up two to three times at night to urinate, which she attributes to increased fluid intake in the evening. No fever, abdominal pain, back pain, dysuria, or hematuria.  She has been experiencing night sweats for quite some time, occurring only at night and affecting her entire body. She has not experienced hot flashes in the past and has not been on hormonal therapy. She recalls her thyroid  function was normal during her last check-up.  She recently saw her cardiologist for a six-month follow-up and reports no issues were found. Her sister suggested that the night sweats might be related to her heart, but she denies having any heart issues. No chest pain or shortness of breath.    PERTINENT PMH / PSH: As above  OBJECTIVE:  BP 122/64 (Cuff Size: Normal)   Pulse (!) 52   Temp 98 F (36.7 C) (Oral)   Resp 17   Ht 5\' 4"  (1.626 m)   Wt 116 lb 4 oz (52.7 kg)   SpO2 97%   BMI 19.95 kg/m    Physical Exam Vitals reviewed.  Constitutional:      General: She is not in acute distress.    Appearance: She is not ill-appearing.  HENT:     Head: Normocephalic.     Right Ear: Tympanic membrane, ear canal and external ear normal.     Left Ear: Tympanic membrane, ear canal and external ear normal.     Nose: Nose normal.     Mouth/Throat:     Mouth: Mucous  membranes are moist.  Eyes:     Extraocular Movements: Extraocular movements intact.     Conjunctiva/sclera: Conjunctivae normal.     Pupils: Pupils are equal, round, and reactive to light.  Neck:     Thyroid : No thyromegaly or thyroid  tenderness.     Vascular: No carotid bruit.  Cardiovascular:     Rate and Rhythm: Normal rate and regular rhythm.     Pulses: Normal pulses.     Heart sounds: Normal heart sounds.  Pulmonary:     Effort: Pulmonary effort is normal.     Breath sounds: Normal breath sounds.  Abdominal:     General: Bowel sounds are normal. There is no distension.     Palpations: Abdomen is soft.     Tenderness: There is no abdominal tenderness. There is no right CVA tenderness, left CVA tenderness, guarding or rebound.  Musculoskeletal:        General: Normal range of motion.     Cervical back: Normal range of motion.     Right lower leg: No edema.     Left lower leg: No edema.  Lymphadenopathy:     Cervical: No cervical adenopathy.  Skin:    Capillary Refill: Capillary refill takes less than 2 seconds.  Neurological:     General: No focal  deficit present.     Mental Status: She is alert and oriented to person, place, and time. Mental status is at baseline.     Motor: No weakness.  Psychiatric:        Mood and Affect: Mood normal.        Behavior: Behavior normal.        Thought Content: Thought content normal.        Judgment: Judgment normal.           01/09/2024    9:04 AM 12/26/2023    9:41 AM 07/14/2023    9:46 AM 01/10/2023   10:05 AM 12/19/2022    3:53 PM  Depression screen PHQ 2/9  Decreased Interest 0 0 0 0 0  Down, Depressed, Hopeless 0 0 0 0 0  PHQ - 2 Score 0 0 0 0 0  Altered sleeping 0 0 0 0   Tired, decreased energy 1 0 0 0   Change in appetite 0 0 0 0   Feeling bad or failure about yourself  0 0 0 0   Trouble concentrating 0 0 0 0   Moving slowly or fidgety/restless 0 0 0 0   Suicidal thoughts 0 0 0 0   PHQ-9 Score 1 0 0 0    Difficult doing work/chores Not difficult at all Not difficult at all Not difficult at all Not difficult at all       01/09/2024    9:07 AM 07/14/2023    9:46 AM 10/30/2022    1:11 PM 01/26/2021    9:47 AM  GAD 7 : Generalized Anxiety Score  Nervous, Anxious, on Edge 1 0 0 0  Control/stop worrying 0 0 0 0  Worry too much - different things 0 0 0 0  Trouble relaxing 1 0 0 0  Restless 0 0 0 0  Easily annoyed or irritable 1 0 0 0  Afraid - awful might happen 0 0 0 0  Total GAD 7 Score 3 0 0 0  Anxiety Difficulty Not difficult at all Not difficult at all Not difficult at all Not difficult at all    ASSESSMENT/PLAN:  Mood disorder (HCC) Assessment & Plan: Managed with SSRI -Refill Effexor  37.5 mg 2 tabs am and 1 tab pm  Orders: -     Venlafaxine  HCl; TAKE 2 TABLETS BY MOUTH EVERY MORNING AND 1 TABLET EVERY EVENING  Dispense: 270 tablet; Refill: 3  Aortic atherosclerosis (HCC) Assessment & Plan: Noted on CT abdomen 06/09/2022.  Refill simvastatin  40 mg daily Refill Zetia  10 mg daily Lipids annually  Orders: -     Comprehensive metabolic panel with GFR -     Lipid panel  Pure hypercholesterolemia Assessment & Plan: Refill simvastatin  40 mg daily Refill Zetia  10 mg daily  Orders: -     Lipid panel  Other iron deficiency anemia -     CBC with Differential/Platelet  Elevated TSH Assessment & Plan: Night sweats.  No infectious symptoms. -Check thyroid  labs today to assess current status.  Orders: -     TSH  Vitamin D  deficiency -     VITAMIN D  25 Hydroxy (Vit-D Deficiency, Fractures)  Gastroesophageal reflux disease without esophagitis Assessment & Plan: Refill omeprazole  Orders: -     Esomeprazole  Magnesium ; Take 1 capsule (40 mg total) by mouth 2 (two) times daily before a meal. 30 minutes before food  Dispense: 180 capsule; Refill: 3  Hyperlipidemia, unspecified hyperlipidemia type Assessment & Plan: Refill simvastatin  40 mg  daily Refill Zetia  10 mg  daily  Orders: -     Simvastatin ; TAKE 1 TABLET(40 MG) BY MOUTH DAILY AT 6 PM  Dispense: 90 tablet; Refill: 3  History of gallstones -     Ursodiol ; Take one three times a day  Dispense: 270 tablet; Refill: 3  Nocturia Assessment & Plan: Nocturia 2-3 times per night, likely from increased evening fluid intake. No associated symptoms. Complete bladder emptying observed. - Advise reducing fluid intake before bedtime. - POC urinalysis negative for infection  Orders: -     POCT Urinalysis Dipstick (Automated)  Night sweats Assessment & Plan: Night sweats at night, likely due to excessive bedding. No daytime sweats or other symptoms. No infectious symptoms.  Normal thyroid  function previously. Cardiologist follow-up unremarkable. - Advise reducing bedding and monitor night sweats. - Recheck thyroid  function if not done recently.     PDMP reviewed  Return if symptoms worsen or fail to improve, for PCP.  Valli Gaw, MD

## 2024-01-09 NOTE — Patient Instructions (Addendum)
 It was a pleasure meeting you today. Thank you for allowing me to take part in your health care.  Our goals for today as we discussed include:  We will get some labs today.  If they are abnormal or we need to do something about them, I will call you.  If they are normal, I will send you a message on MyChart (if it is active) or a letter in the mail.  If you don't hear from us  in 2 weeks, please call the office at the number below.   Urine negative for infection  Refills sent for requested medications  This is a list of the screening recommended for you and due dates:  Health Maintenance  Topic Date Due   Zoster (Shingles) Vaccine (1 of 2) 06/07/1966   DTaP/Tdap/Td vaccine (2 - Tdap) 12/09/2018   COVID-19 Vaccine (4 - 2024-25 season) 05/18/2023   Colon Cancer Screening  05/21/2023   Flu Shot  04/16/2024   Mammogram  07/02/2024   Medicare Annual Wellness Visit  12/25/2024   Pneumonia Vaccine  Completed   DEXA scan (bone density measurement)  Completed   Hepatitis C Screening  Completed   HPV Vaccine  Aged Out   Meningitis B Vaccine  Aged Out      If you have any questions or concerns, please do not hesitate to call the office at [phone].  I look forward to our next visit and until then take care and stay safe.  Regards,   Valli Gaw, MD   [facility]

## 2024-01-16 ENCOUNTER — Telehealth: Payer: Self-pay

## 2024-01-16 ENCOUNTER — Ambulatory Visit
Admission: EM | Admit: 2024-01-16 | Discharge: 2024-01-16 | Disposition: A | Discharge status: Home / Self Care | Attending: Physician Assistant | Admitting: Physician Assistant

## 2024-01-16 DIAGNOSIS — J0120 Acute ethmoidal sinusitis, unspecified: Secondary | ICD-10-CM | POA: Diagnosis not present

## 2024-01-16 LAB — POC SARS CORONAVIRUS 2 AG -  ED: SARS Coronavirus 2 Ag: NEGATIVE

## 2024-01-16 MED ORDER — AZITHROMYCIN 250 MG PO TABS: ORAL_TABLET | ORAL | 0 refills | Status: AC

## 2024-01-16 NOTE — Telephone Encounter (Signed)
 LMTCB

## 2024-01-16 NOTE — Discharge Instructions (Addendum)
 Return if any problems.

## 2024-01-16 NOTE — ED Provider Notes (Signed)
 [facility]    CSN: [id number] Arrival date & time: 01/16/24  1555      History   Chief Complaint Chief Complaint  Patient presents with   Nasal Congestion    HPI Katie [name] is a 78 y.o. female.   Patient complains of having a sinus infection.  Patient reports she began having symptoms 2 days ago.  Patient complains of nasal congestion and pain in her frontal sinuses.  Patient reports that she has had multiple sinus infections in the past.  Patient denies any cough or Chest congestion.  Patient states that her job also requires that she have a COVID test before she returns to work.  The history is provided by the patient. No language interpreter was used.    Past Medical History:  Diagnosis Date   Acute recurrent sinusitis 02/15/2022   ANEMIA, PERNICIOUS 03/13/2007   Qualifier: Diagnosis of   By: Taunya Fass LPN, Regina       Anxiety    Anxiety 06/16/2019   Atypical chest pain 09/04/2020   Benign head tremor 11/08/2013   Bile duct stricture    BILE DUCT STRICTURE 04/26/2008   Qualifier: Diagnosis of   By: Kayleen Party LPN, Deborah       Breast mass, left    Breast pain, left 01/02/2021   Calculus of bile duct 04/26/2008   Qualifier: Diagnosis of   By: Kayleen Party LPN, Deborah       Choledocholithiasis    recurrent   Constipation 02/08/2022   COVID-19    09/30/20, 10/07/20    Depression 02/13/1995   Long history of this.    GERD (gastroesophageal reflux disease)    Hypertension    Migraine without aura, without mention of intractable migraine without mention of status migrainosus 01/03/2014   Rapid heart beat    Rotaviral gastroenteritis    12/2020    Patient Active Problem List   Diagnosis Date Noted   Urinary frequency 07/14/2023   Nausea 07/14/2023   Abnormal urinalysis 07/14/2023   Annual physical exam 01/27/2023   Elevated TSH 01/27/2023   Colon cancer screening 01/27/2023   History of gallstones 11/02/2022   Mood disorder (HCC) 11/02/2022   Abnormal  glucose 11/02/2022   Coronary artery disease involving native coronary artery of native heart without angina pectoris 11/02/2022   Bladder neoplasm 02/08/2022   Dilated pancreatic duct 02/08/2022   Aortic atherosclerosis (HCC) 02/08/2022   Ureterocele 02/08/2022   Iron deficiency anemia 01/18/2022   Thoracic spondylosis 01/17/2022   Osteoporosis 07/06/2020   Leg cramps 12/30/2018   Arthritis 08/26/2018   History of skin cancer 04/10/2018   Macular degeneration, bilateral 04/10/2018   Plantar fasciitis 04/10/2018   Left hip pain 04/10/2018   H/O gastric bypass 04/17/2014   HLD (hyperlipidemia) 04/17/2014   Vitamin D  deficiency 04/17/2014   Visual changes 11/08/2013   Benign essential tremor 11/08/2013   BP (high blood pressure) 04/26/2013   Vitamin B12 deficiency 04/03/2012   GERD 05/11/2008    Past Surgical History:  Procedure Laterality Date   ABDOMINAL HYSTERECTOMY     BLADDER SURGERY     x 2   BREAST EXCISIONAL BIOPSY Right 2003   benign   BREAST LUMPECTOMY     right   BREAST LUMPECTOMY WITH RADIOACTIVE SEED LOCALIZATION Left 03/31/2017   Procedure: LEFT BREAST LUMPECTOMY WITH RADIOACTIVE SEED LOCALIZATION;  Surgeon: Oza Blumenthal, MD;  Location: [facility] OR;  Service: General;  Laterality: Left;   CHOLECYSTECTOMY     COLONOSCOPY  CYSTOSCOPY     normal 03/18/22   Explor Lap, Hepatoduodenostomy  07/23/2011   Dr Doc Freed, [facility]   FOOT SURGERY     both feet   GASTRIC BYPASS  05/04/2013   PARTIAL HYSTERECTOMY     SKIN BIOPSY     skin bx RUB benign   TUBAL LIGATION     VEIN LIGATION AND STRIPPING     left leg   WRIST SURGERY     right    OB History   No obstetric history on file.      Home Medications    Prior to Admission medications   Medication Sig Start Date End Date Taking? Authorizing Provider  azithromycin  (ZITHROMAX  Z-PAK) 250 MG tablet Take 2 tablets (500 mg) on  Day 1,  followed by 1 tablet (250 mg) once daily on Days 2 through 5. 01/16/24 01/21/24 Yes  Daily Crate K, PA-C  Biotin 1 MG CAPS Take 1 mg by mouth daily.     [provider]  CHOLECALCIFEROL PO Take by mouth daily.    [provider]  denosumab (PROLIA) 60 MG/ML SOSY injection Inject 60 mg into the skin every 6 (six) months.    [provider]  esomeprazole  (NEXIUM ) 40 MG capsule Take 1 capsule (40 mg total) by mouth 2 (two) times daily before a meal. 30 minutes before food 01/09/24   Valli Gaw, MD  ezetimibe  (ZETIA ) 10 MG tablet TAKE 1 TABLET(10 MG) BY MOUTH DAILY 07/14/23   Valli Gaw, MD  Ferrous Sulfate (IRON PO) Take 1 tablet by mouth every other day. OTC Patient not taking: Reported on 01/09/2024    [provider]  isosorbide  mononitrate (IMDUR ) 30 MG 24 hr tablet Take 0.5 tablets (15 mg total) by mouth daily. 01/08/24   Morey Ar, NP  Multiple Vitamins-Minerals (PRESERVISION AREDS) CAPS Take by mouth 2 (two) times daily.    [provider]  ondansetron  (ZOFRAN ) 4 MG tablet Take 1 tablet (4 mg total) by mouth every 8 (eight) hours as needed for nausea or vomiting. 07/14/23   Valli Gaw, MD  propranolol  (INDERAL ) 40 MG tablet Take 1 tablet (40 mg total) by mouth 2 (two) times daily. 01/08/24   Morey Ar, NP  simvastatin  (ZOCOR ) 40 MG tablet TAKE 1 TABLET(40 MG) BY MOUTH DAILY AT 6 PM 01/09/24   Valli Gaw, MD  ursodiol  (ACTIGALL ) 250 MG tablet Take one three times a day 01/09/24   Valli Gaw, MD  venlafaxine  (EFFEXOR ) 37.5 MG tablet TAKE 2 TABLETS BY MOUTH EVERY MORNING AND 1 TABLET EVERY EVENING 01/09/24   Valli Gaw, MD    Family History Family History  Problem Relation Age of Onset   Cancer Mother        lung, smoker   Alcohol abuse Father    GER disease Sister    Heart attack Sister    Depression Sister    Anxiety disorder Sister    Hypertension Sister    GER disease Brother    Migraines Neg Hx    Breast cancer Neg Hx     Social History Social History   Tobacco Use   Smoking status:  Never    Passive exposure: Never   Smokeless tobacco: Never  Vaping Use   Vaping status: Never Used  Substance Use Topics   Alcohol use: No   Drug use: No     Allergies   Lipitor [atorvastatin calcium]   Review of Systems Review of Systems  All other systems reviewed and  are negative.    Physical Exam Triage Vital Signs ED Triage Vitals  Encounter Vitals Group     BP 01/16/24 1635 133/83     Systolic BP Percentile --      Diastolic BP Percentile --      Pulse Rate 01/16/24 1635 (!) 55     Resp 01/16/24 1635 18     Temp 01/16/24 1635 98.4 F (36.9 C)     Temp src --      SpO2 --      Weight --      Height --      Head Circumference --      Peak Flow --      Pain Score 01/16/24 1637 0     Pain Loc --      Pain Education --      Exclude from Growth Chart --    No data found.  Updated Vital Signs BP 133/83   Pulse (!) 55   Temp 98.4 F (36.9 C)   Resp 18   Visual Acuity Right Eye Distance:   Left Eye Distance:   Bilateral Distance:    Right Eye Near:   Left Eye Near:    Bilateral Near:     Physical Exam Vitals and nursing note reviewed.  Constitutional:      Appearance: She is well-developed.  HENT:     Head: Normocephalic.     Mouth/Throat:     Mouth: Mucous membranes are moist.  Cardiovascular:     Rate and Rhythm: Normal rate.  Pulmonary:     Effort: Pulmonary effort is normal.  Abdominal:     General: There is no distension.  Musculoskeletal:        General: Normal range of motion.  Skin:    General: Skin is warm.  Neurological:     General: No focal deficit present.     Mental Status: She is alert and oriented to person, place, and time.  Psychiatric:        Mood and Affect: Mood normal.      UC Treatments / Results  Labs (all labs ordered are listed, but only abnormal results are displayed) Labs Reviewed  POC SARS CORONAVIRUS 2 AG -  ED    EKG   Radiology No results found.  Procedures Procedures (including  critical care time)  Medications Ordered in UC Medications - No data to display  Initial Impression / Assessment and Plan / UC Course  I have reviewed the triage vital signs and the nursing notes.  Pertinent labs & imaging results that were available during my care of the patient were reviewed by me and considered in my medical decision making (see chart for details).      Final Clinical Impressions(s) / UC Diagnoses   Final diagnoses:  Acute ethmoidal sinusitis, recurrence not specified     Discharge Instructions      Return if any problems   ED Prescriptions     Medication Sig Dispense Auth. Provider   azithromycin  (ZITHROMAX  Z-PAK) 250 MG tablet Take 2 tablets (500 mg) on  Day 1,  followed by 1 tablet (250 mg) once daily on Days 2 through 5. 6 each Marvel Mcphillips K, PA-C      PDMP not reviewed this encounter. An After Visit Summary was printed and given to the patient.       Sandi Crosby, PA-C 01/16/24 1610

## 2024-01-16 NOTE — Telephone Encounter (Unsigned)
 Copied from CRM [id number]. Topic: Clinical - Lab/Test Results >> Jan 16, 2024 12:19 PM Allyne Areola wrote: Reason for CRM: Patient would like to discuss lab work done on 01/09/2024, She is also dealing with a sinus infection and would like antibiotics, I advised that an appointment would be required but she stated she was just in the office last week.

## 2024-01-16 NOTE — ED Triage Notes (Signed)
 Patient to Urgent Care with complaints of sore throat/ nasal congestion/ cough. Concerned about a sinus infection.   Symptoms started Wednesday.   Meds: Dayquil.

## 2024-01-18 ENCOUNTER — Encounter: Payer: Self-pay | Admitting: Family Medicine

## 2024-01-18 DIAGNOSIS — R61 Generalized hyperhidrosis: Secondary | ICD-10-CM | POA: Insufficient documentation

## 2024-01-18 DIAGNOSIS — R351 Nocturia: Secondary | ICD-10-CM | POA: Insufficient documentation

## 2024-01-18 NOTE — Assessment & Plan Note (Signed)
 Refill simvastatin  40 mg daily Refill Zetia  10 mg daily

## 2024-01-18 NOTE — Assessment & Plan Note (Signed)
 Night sweats.  No infectious symptoms. -Check thyroid  labs today to assess current status.

## 2024-01-18 NOTE — Assessment & Plan Note (Signed)
 Refill omeprazole

## 2024-01-18 NOTE — Assessment & Plan Note (Signed)
 Night sweats at night, likely due to excessive bedding. No daytime sweats or other symptoms. No infectious symptoms.  Normal thyroid  function previously. Cardiologist follow-up unremarkable. - Advise reducing bedding and monitor night sweats. - Recheck thyroid  function if not done recently.

## 2024-01-18 NOTE — Assessment & Plan Note (Signed)
Managed with SSRI -Refill Effexor 37.5 mg 2 tabs am and 1 tab pm

## 2024-01-18 NOTE — Assessment & Plan Note (Signed)
 Nocturia 2-3 times per night, likely from increased evening fluid intake. No associated symptoms. Complete bladder emptying observed. - Advise reducing fluid intake before bedtime. - POC urinalysis negative for infection

## 2024-01-18 NOTE — Assessment & Plan Note (Addendum)
 Noted on CT abdomen 06/09/2022.  Refill simvastatin  40 mg daily Refill Zetia  10 mg daily Lipids annually

## 2024-01-20 NOTE — Telephone Encounter (Signed)
 Pt was seen at [facility] on 01/16/2024

## 2024-01-21 ENCOUNTER — Other Ambulatory Visit: Payer: Self-pay

## 2024-01-21 DIAGNOSIS — H353132 Nonexudative age-related macular degeneration, bilateral, intermediate dry stage: Secondary | ICD-10-CM | POA: Diagnosis not present

## 2024-01-21 DIAGNOSIS — H527 Unspecified disorder of refraction: Secondary | ICD-10-CM | POA: Diagnosis not present

## 2024-01-21 DIAGNOSIS — H43812 Vitreous degeneration, left eye: Secondary | ICD-10-CM | POA: Diagnosis not present

## 2024-01-21 DIAGNOSIS — R7989 Other specified abnormal findings of blood chemistry: Secondary | ICD-10-CM

## 2024-01-21 DIAGNOSIS — H35363 Drusen (degenerative) of macula, bilateral: Secondary | ICD-10-CM | POA: Diagnosis not present

## 2024-01-21 MED ORDER — LEVOTHYROXINE SODIUM 50 MCG PO TABS: ORAL_TABLET | ORAL | 0 refills | Status: DC

## 2024-03-05 ENCOUNTER — Other Ambulatory Visit (INDEPENDENT_AMBULATORY_CARE_PROVIDER_SITE_OTHER)

## 2024-03-05 ENCOUNTER — Ambulatory Visit: Payer: Self-pay | Admitting: Family Medicine

## 2024-03-05 DIAGNOSIS — R7989 Other specified abnormal findings of blood chemistry: Secondary | ICD-10-CM

## 2024-03-05 LAB — TSH: TSH: 3.54 u[IU]/mL (ref 0.35–5.50)

## 2024-03-08 ENCOUNTER — Other Ambulatory Visit: Payer: Self-pay | Admitting: Family Medicine

## 2024-03-08 DIAGNOSIS — R7989 Other specified abnormal findings of blood chemistry: Secondary | ICD-10-CM

## 2024-03-08 MED ORDER — LEVOTHYROXINE SODIUM 50 MCG PO TABS: ORAL_TABLET | ORAL | 3 refills | Status: DC

## 2024-04-01 ENCOUNTER — Other Ambulatory Visit: Payer: Self-pay

## 2024-04-01 DIAGNOSIS — I2510 Atherosclerotic heart disease of native coronary artery without angina pectoris: Secondary | ICD-10-CM

## 2024-04-05 DIAGNOSIS — M778 Other enthesopathies, not elsewhere classified: Secondary | ICD-10-CM | POA: Diagnosis not present

## 2024-04-05 DIAGNOSIS — M7541 Impingement syndrome of right shoulder: Secondary | ICD-10-CM | POA: Diagnosis not present

## 2024-04-05 DIAGNOSIS — G8929 Other chronic pain: Secondary | ICD-10-CM | POA: Diagnosis not present

## 2024-04-05 DIAGNOSIS — M25512 Pain in left shoulder: Secondary | ICD-10-CM | POA: Diagnosis not present

## 2024-04-05 DIAGNOSIS — M25511 Pain in right shoulder: Secondary | ICD-10-CM | POA: Diagnosis not present

## 2024-04-05 DIAGNOSIS — M7542 Impingement syndrome of left shoulder: Secondary | ICD-10-CM | POA: Diagnosis not present

## 2024-04-05 DIAGNOSIS — M19011 Primary osteoarthritis, right shoulder: Secondary | ICD-10-CM | POA: Diagnosis not present

## 2024-04-06 ENCOUNTER — Other Ambulatory Visit: Payer: Self-pay | Admitting: Emergency Medicine

## 2024-04-06 ENCOUNTER — Telehealth: Payer: Self-pay | Admitting: Emergency Medicine

## 2024-04-06 MED ORDER — ISOSORBIDE MONONITRATE ER 30 MG PO TB24: 15.0000 mg | ORAL_TABLET | Freq: Every day | ORAL | 3 refills | Status: AC

## 2024-04-06 NOTE — Telephone Encounter (Signed)
 Called and spoke with the patient after calling and speaking with pharmacy tech at [facility].  The tech verified that the patient did have 3 refills for Isosorbide  Mononitrate 15 mg, 90 tablets for the patient.  Called the patient to verify that she is taking 1/2 tablet daily as prescribed.  Patient states that she is and does not need a refill at this time.  Patient states that she received a message from the pharmacy stating the medication needed to be pre approval.  Patient said she will contact the pharmacy to find out why they texted her.

## 2024-04-20 ENCOUNTER — Other Ambulatory Visit: Payer: Self-pay

## 2024-04-20 DIAGNOSIS — R7989 Other specified abnormal findings of blood chemistry: Secondary | ICD-10-CM

## 2024-04-20 MED ORDER — LEVOTHYROXINE SODIUM 50 MCG PO TABS: ORAL_TABLET | ORAL | 0 refills | Status: DC

## 2024-04-23 ENCOUNTER — Other Ambulatory Visit: Payer: Self-pay | Admitting: Internal Medicine

## 2024-04-23 DIAGNOSIS — R7989 Other specified abnormal findings of blood chemistry: Secondary | ICD-10-CM

## 2024-06-18 DIAGNOSIS — Z85828 Personal history of other malignant neoplasm of skin: Secondary | ICD-10-CM | POA: Diagnosis not present

## 2024-06-18 DIAGNOSIS — D2361 Other benign neoplasm of skin of right upper limb, including shoulder: Secondary | ICD-10-CM | POA: Diagnosis not present

## 2024-06-18 DIAGNOSIS — L821 Other seborrheic keratosis: Secondary | ICD-10-CM | POA: Diagnosis not present

## 2024-06-18 DIAGNOSIS — L565 Disseminated superficial actinic porokeratosis (DSAP): Secondary | ICD-10-CM | POA: Diagnosis not present

## 2024-06-18 DIAGNOSIS — C44722 Squamous cell carcinoma of skin of right lower limb, including hip: Secondary | ICD-10-CM | POA: Diagnosis not present

## 2024-06-18 DIAGNOSIS — L814 Other melanin hyperpigmentation: Secondary | ICD-10-CM | POA: Diagnosis not present

## 2024-06-18 DIAGNOSIS — D485 Neoplasm of uncertain behavior of skin: Secondary | ICD-10-CM | POA: Diagnosis not present

## 2024-06-18 DIAGNOSIS — L570 Actinic keratosis: Secondary | ICD-10-CM | POA: Diagnosis not present

## 2024-06-18 DIAGNOSIS — L918 Other hypertrophic disorders of the skin: Secondary | ICD-10-CM | POA: Diagnosis not present

## 2024-06-18 DIAGNOSIS — D1801 Hemangioma of skin and subcutaneous tissue: Secondary | ICD-10-CM | POA: Diagnosis not present

## 2024-07-09 NOTE — Progress Notes (Signed)
 Cardiology Clinic Note   Date: 07/12/2024 ID: Almar[place], Bohac [birth year], MRN [id number]  Primary Cardiologist:  Deatrice Cage, MD  Chief Complaint   Katie [name] is a 78 y.o. female who presents to the clinic today for routine follow up.   Patient Profile   Katie [name] is followed by Dr. Cage for the history outlined below.       Past medical history significant for: CAD.  Coronary CTA with FFR 11/01/2021: Coronary calcium score 32.7 (47th percentile).  Calcified plaque causing moderate mid LAD stenosis.  FFR analysis showed significant nonfocal stenosis in the mid LAD.  Recommend antianginal medication.  Consider cardiac catheterization if symptoms persist despite medical therapy. Mitral valve regurgitation. Echo 11/15/2021: EF 60 to 65%.  No RWMA.  Grade I DD.  Normal RV size/function.  Mild to moderate MR.  Aortic valve sclerosis without stenosis. Palpitations. Hypertension. Hyperlipidemia. Lipid panel 01/09/2024: LDL 88, HDL 80, TG 143, total 196. Hypothyroidism.  GERD. Obesity. S/p gastric bypass.  In summary, patient was first evaluated by Dr. Cage in December 2021 for pain in the left breast area.  She reported a history of palpitations controlled by propranolol .  She reported a history of bilateral breast surgery with soreness in the left breast over the preceding several months.  Pain was not exertional.  The area was tender to palpation.  EKG without ischemic changes.  Cardiac chest pain was felt to be unlikely and she was advised to follow-up with PCP.  She was evaluated in February 2023 for sharp chest pain occurring in the center of her chest while driving and accompanied by diaphoresis.  She continued to describe an ache in her left breast that was unchanged from previous.  Coronary CTA demonstrated calcium score of 32.7 with moderate stenosis of the LAD as detailed above.  She was started on isosorbide  15 mg.  Echo demonstrated normal LV/RV function as detailed  above.  Patient was seen in the office by Bernardino Bring, PA-C on 05/26/2023 for routine follow-up.  She was doing well at that time with mild bilateral ankle edema occurring when on her feet for extended periods of time and improving with elevation.  No medication changes were made.   Patient was last seen in the office by me on 01/08/2024 for routine follow-up.  She reported an occasional twinge of central chest pain described as very brief and resolving on its own typically occurring when she was very tired.  She also reported a fluttering sensation at night when she was very tired.  She reported night sweats and was encouraged to discuss with PCP.  Discussed further evaluation of chest pain but she declined as she did not feel episodes were bothersome.  She declined repeat echo.  BP was well-controlled at the time of her visit.  No medication changes were made.     History of Present Illness    Today, patient is here alone. Patient denies shortness of breath, dyspnea on exertion, lower extremity edema, orthopnea or PND. No chest pain, pressure, or tightness. She has occasional palpitations well controlled with propranolol . She denies lightheadedness, dizziness, presyncope or syncope. She has noticed in the last 6 months or so that she will occasionally wake up drenched in sweat. She has no chest pain, shortness of breath or palpitations with the episodes of sweating. Does not sweat during the day. 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 She stays very busy with  activities at home.     ROS: All other systems reviewed and are otherwise negative except as noted in History of Present Illness.  EKGs/Labs Reviewed    EKG Interpretation Date/Time:  Monday July 12 2024 14:54:56 EDT Ventricular Rate:  51 PR Interval:  164 QRS Duration:  84 QT Interval:  446 QTC Calculation: 411 R Axis:   29  Text Interpretation: Sinus bradycardia Nonspecific T wave abnormality When  compared with ECG of 01/08/2023 No significant change was found Confirmed by Loistine Sober [phone]) on 07/12/2024 2:58:36 PM   01/09/2024: ALT 12; AST 13; BUN 11; Creatinine, Ser 0.64; Potassium 5.1; Sodium 133   01/09/2024: Hemoglobin 12.5; WBC 4.3   03/05/2024: TSH 3.54    Risk Assessment/Calculations      HYPERTENSION CONTROL Vitals:   07/12/24 1626 07/12/24 1627  BP: (!) 156/68 (!) 140/70    The patient's blood pressure is elevated above target today.  In order to address the patient's elevated BP: Blood pressure will be monitored at home to determine if medication changes need to be made.           Physical Exam    VS:  BP (!) 140/70 (BP Location: Left Arm, Patient Position: Sitting, Cuff Size: Normal)   Pulse (!) 51   Ht 5' 4 (1.626 m)   Wt 119 lb 12.8 oz (54.3 kg)   SpO2 98%   BMI 20.56 kg/m  , BMI Body mass index is 20.56 kg/m.  GEN: Well nourished, well developed, in no acute distress. Neck: No JVD or carotid bruits. Cardiac:  RRR. 1/6 systolic murmur. No rubs or gallops.   Respiratory:  Respirations regular and unlabored. Clear to auscultation without rales, wheezing or rhonchi. GI: Soft, nontender, nondistended. Extremities: Radials/DP/PT 2+ and equal bilaterally. No clubbing or cyanosis. No edema   Skin: Warm and dry, no rash. Neuro: Strength intact.  Assessment & Plan   CAD Coronary CTA with FFR February 2023 demonstrated coronary calcium score of 32.7, moderate mid LAD stenosis, FFR demonstrated significant nonfocal stenosis in mid LAD.  Patient denies chest pain, pressure or tightness.  - Continue propranolol , simvastatin , Zetia , isosorbide .   Mitral valve regurgitation Echo March 2023 demonstrated mild to moderate MR.  Patient denies lightheadedness, dizziness, presyncope, syncope, lower extremity edema or shortness of breath. 1/6 systolic murmur on exam today.  - Schedule echo.   Palpitations Patient reports an occasional fluttering sensation  in her chest typically at night. RRR on exam today.EKG shows sinus bradycardia. HR 51. No lightheadedness, dizziness, presyncope or syncope. Discussed reducing propranolol  if she develops any symptoms.  - Continue propranolol .   Hypertension BP today 156/68 on intake and 140/70 on my recheck. She reports BP likely elevated because she got lost and was late coming in. No report of headaches or dizziness.  - Continue propranolol , isosorbide .  Night sweats Patient reports in the last 6 months she has woken up drenched in sweat. It does not happen often. She has no associated symptoms of chest pain, shortness of breath or palpitations. She takes levothyroxine  for hypothyroidism.   - CBC, BMP, TSH today.    Hyperlipidemia LDL 88 April 2025, not at goal.  Patient declined changing from simvastatin  to rosuvastatin.  - Continue simvastatin  and ezetimibe .  Disposition: CBC, BMP, TSH. Schedule for echo. Return in 6 months or sooner as needed.          Signed, Sober HERO. Cambry Spampinato, DNP, NP-C

## 2024-07-12 ENCOUNTER — Encounter: Payer: Self-pay | Admitting: Student

## 2024-07-12 ENCOUNTER — Ambulatory Visit: Attending: Student | Admitting: Student

## 2024-07-12 VITALS — BP 140/70 | HR 51 | Ht 64.0 in | Wt 119.8 lb

## 2024-07-12 DIAGNOSIS — I10 Essential (primary) hypertension: Secondary | ICD-10-CM | POA: Diagnosis not present

## 2024-07-12 DIAGNOSIS — R002 Palpitations: Secondary | ICD-10-CM | POA: Diagnosis not present

## 2024-07-12 DIAGNOSIS — I340 Nonrheumatic mitral (valve) insufficiency: Secondary | ICD-10-CM | POA: Diagnosis not present

## 2024-07-12 DIAGNOSIS — I2510 Atherosclerotic heart disease of native coronary artery without angina pectoris: Secondary | ICD-10-CM

## 2024-07-12 DIAGNOSIS — R61 Generalized hyperhidrosis: Secondary | ICD-10-CM | POA: Diagnosis not present

## 2024-07-12 DIAGNOSIS — Z79899 Other long term (current) drug therapy: Secondary | ICD-10-CM

## 2024-07-12 DIAGNOSIS — E785 Hyperlipidemia, unspecified: Secondary | ICD-10-CM | POA: Diagnosis not present

## 2024-07-12 NOTE — Patient Instructions (Signed)
 Medication Instructions:  Your physician recommends that you continue on your current medications as directed. Please refer to the Current Medication list given to you today.  *If you need a refill on your cardiac medications before your next appointment, please call your pharmacy*  Lab Work: Your provider would like for you to have following labs drawn today CBC, BMET, TSH.   If you have labs (blood work) drawn today and your tests are completely normal, you will receive your results only by: MyChart Message (if you have MyChart) OR A paper copy in the mail If you have any lab test that is abnormal or we need to change your treatment, we will call you to review the results.  Testing/Procedures: Your physician has requested that you have an echocardiogram. Echocardiography is a painless test that uses sound waves to create images of your heart. It provides your doctor with information about the size and shape of your heart and how well your heart's chambers and valves are working.   You may receive an ultrasound enhancing agent through an IV if needed to better visualize your heart during the echo. This procedure takes approximately one hour.  There are no restrictions for this procedure.  This will take place at [address] [facility] [address]) [address], [place] [address]  Please note: We ask at that you not bring children with you during ultrasound (echo/ vascular) testing. Due to room size and safety concerns, children are not allowed in the ultrasound rooms during exams. Our front office staff cannot provide observation of children in our lobby area while testing is being conducted. An adult accompanying a patient to their appointment will only be allowed in the ultrasound room at the discretion of the ultrasound technician under special circumstances. We apologize for any inconvenience.   Follow-Up: At [facility], you and your health needs are our priority.  As part of  our continuing mission to provide you with exceptional heart care, our providers are all part of one team.  This team includes your primary Cardiologist (physician) and Advanced Practice Providers or APPs (Physician Assistants and Nurse Practitioners) who all work together to provide you with the care you need, when you need it.  Your next appointment:   6 month(s)  Provider:   You may see Deatrice Cage, MD or one of the following Advanced Practice Providers on your designated Care Team:    Barnie Hila, NP  We recommend signing up for the patient portal called MyChart.  Sign up information is provided on this After Visit Summary.  MyChart is used to connect with patients for Virtual Visits (Telemedicine).  Patients are able to view lab/test results, encounter notes, upcoming appointments, etc.  Non-urgent messages can be sent to your provider as well.   To learn more about what you can do with MyChart, go to [email or web address].

## 2024-07-13 ENCOUNTER — Ambulatory Visit: Payer: Self-pay | Admitting: Student

## 2024-07-13 LAB — BASIC METABOLIC PANEL WITH GFR
BUN/Creatinine Ratio: 15 (ref 12–28)
BUN: 11 mg/dL (ref 8–27)
CO2: 23 mmol/L (ref 20–29)
Calcium: 9.5 mg/dL (ref 8.7–10.3)
Chloride: 97 mmol/L (ref 96–106)
Creatinine, Ser: 0.73 mg/dL (ref 0.57–1.00)
Glucose: 82 mg/dL (ref 70–99)
Potassium: 5 mmol/L (ref 3.5–5.2)
Sodium: 134 mmol/L (ref 134–144)
eGFR: 85 mL/min/1.73 (ref >59)

## 2024-07-13 LAB — CBC
Hematocrit: 35.5 % (ref 34.0–46.6)
Hemoglobin: 11.5 g/dL (ref 11.1–15.9)
MCH: 30.7 pg (ref 26.6–33.0)
MCHC: 32.4 g/dL (ref 31.5–35.7)
MCV: 95 fL (ref 79–97)
Platelets: 165 x10E3/uL (ref 150–450)
RBC: 3.75 x10E6/uL — ABNORMAL LOW (ref 3.77–5.28)
RDW: 11.9 % (ref 11.7–15.4)
WBC: 6.3 x10E3/uL (ref 3.4–10.8)

## 2024-07-13 LAB — TSH: TSH: 2.87 u[IU]/mL (ref 0.450–4.500)

## 2024-07-13 NOTE — Progress Notes (Signed)
 Order(s) created erroneously. Erroneous order ID: [id number]  Order moved by: CHART CORRECTION ANALYST NINE, IDENTITY  Order move date/time: 07/13/2024 9:35 AM  Source Patient: [id number]  Source Contact: 07/12/2024  Destination Patient: [id number]  Destination Contact: 02/26/2023

## 2024-07-19 ENCOUNTER — Encounter: Payer: Self-pay | Admitting: Emergency Medicine

## 2024-07-19 DIAGNOSIS — I700 Atherosclerosis of aorta: Secondary | ICD-10-CM | POA: Diagnosis not present

## 2024-07-19 DIAGNOSIS — M8580 Other specified disorders of bone density and structure, unspecified site: Secondary | ICD-10-CM | POA: Diagnosis not present

## 2024-07-19 DIAGNOSIS — K745 Biliary cirrhosis, unspecified: Secondary | ICD-10-CM | POA: Diagnosis not present

## 2024-07-19 DIAGNOSIS — I25119 Atherosclerotic heart disease of native coronary artery with unspecified angina pectoris: Secondary | ICD-10-CM | POA: Diagnosis not present

## 2024-07-19 DIAGNOSIS — K219 Gastro-esophageal reflux disease without esophagitis: Secondary | ICD-10-CM | POA: Diagnosis not present

## 2024-07-19 DIAGNOSIS — M1990 Unspecified osteoarthritis, unspecified site: Secondary | ICD-10-CM | POA: Diagnosis not present

## 2024-07-19 DIAGNOSIS — F325 Major depressive disorder, single episode, in full remission: Secondary | ICD-10-CM | POA: Diagnosis not present

## 2024-07-19 DIAGNOSIS — E785 Hyperlipidemia, unspecified: Secondary | ICD-10-CM | POA: Diagnosis not present

## 2024-07-19 DIAGNOSIS — N182 Chronic kidney disease, stage 2 (mild): Secondary | ICD-10-CM | POA: Diagnosis not present

## 2024-07-19 NOTE — Progress Notes (Signed)
 Letter Sent.

## 2024-08-05 ENCOUNTER — Ambulatory Visit

## 2024-08-05 VITALS — BP 108/70 | HR 56 | Temp 98.6°F | Ht 64.0 in | Wt 117.2 lb

## 2024-08-05 DIAGNOSIS — E7800 Pure hypercholesterolemia, unspecified: Secondary | ICD-10-CM

## 2024-08-05 DIAGNOSIS — Z79899 Other long term (current) drug therapy: Secondary | ICD-10-CM

## 2024-08-05 DIAGNOSIS — D508 Other iron deficiency anemias: Secondary | ICD-10-CM | POA: Diagnosis not present

## 2024-08-05 DIAGNOSIS — R110 Nausea: Secondary | ICD-10-CM | POA: Diagnosis not present

## 2024-08-05 DIAGNOSIS — Z1231 Encounter for screening mammogram for malignant neoplasm of breast: Secondary | ICD-10-CM | POA: Diagnosis not present

## 2024-08-05 DIAGNOSIS — E785 Hyperlipidemia, unspecified: Secondary | ICD-10-CM

## 2024-08-05 DIAGNOSIS — E039 Hypothyroidism, unspecified: Secondary | ICD-10-CM

## 2024-08-05 DIAGNOSIS — I700 Atherosclerosis of aorta: Secondary | ICD-10-CM

## 2024-08-05 DIAGNOSIS — R7989 Other specified abnormal findings of blood chemistry: Secondary | ICD-10-CM

## 2024-08-05 DIAGNOSIS — E782 Mixed hyperlipidemia: Secondary | ICD-10-CM | POA: Diagnosis not present

## 2024-08-05 DIAGNOSIS — H3530 Unspecified macular degeneration: Secondary | ICD-10-CM

## 2024-08-05 DIAGNOSIS — K219 Gastro-esophageal reflux disease without esophagitis: Secondary | ICD-10-CM | POA: Diagnosis not present

## 2024-08-05 DIAGNOSIS — Z8719 Personal history of other diseases of the digestive system: Secondary | ICD-10-CM

## 2024-08-05 DIAGNOSIS — Z131 Encounter for screening for diabetes mellitus: Secondary | ICD-10-CM

## 2024-08-05 DIAGNOSIS — F39 Unspecified mood [affective] disorder: Secondary | ICD-10-CM

## 2024-08-05 MED ORDER — VENLAFAXINE HCL 37.5 MG PO TABS: ORAL_TABLET | ORAL | 3 refills | Status: AC

## 2024-08-05 MED ORDER — URSODIOL 250 MG PO TABS: ORAL_TABLET | ORAL | 3 refills | Status: AC

## 2024-08-05 MED ORDER — LEVOTHYROXINE SODIUM 50 MCG PO TABS: ORAL_TABLET | ORAL | 3 refills | Status: AC

## 2024-08-05 MED ORDER — SIMVASTATIN 40 MG PO TABS: ORAL_TABLET | ORAL | 3 refills | Status: AC

## 2024-08-05 MED ORDER — EZETIMIBE 10 MG PO TABS: ORAL_TABLET | ORAL | 3 refills | Status: AC

## 2024-08-05 MED ORDER — ONDANSETRON HCL 4 MG PO TABS: 4.0000 mg | ORAL_TABLET | Freq: Every day | ORAL | 1 refills | Status: AC | PRN

## 2024-08-05 MED ORDER — ESOMEPRAZOLE MAGNESIUM 40 MG PO CPDR: 40.0000 mg | DELAYED_RELEASE_CAPSULE | Freq: Two times a day (BID) | ORAL | 3 refills | Status: AC

## 2024-08-05 NOTE — Assessment & Plan Note (Addendum)
 Screening mammogram ordered. Patient will call [facility] breast center to schedule for an appointment.  Orders:   MM 3D SCREENING MAMMOGRAM BILATERAL BREAST; Future

## 2024-08-05 NOTE — Assessment & Plan Note (Deleted)
  Orders:   Lipid panel; Future   Comprehensive metabolic panel with GFR; Future

## 2024-08-05 NOTE — Assessment & Plan Note (Addendum)
 Intermittent managed with prn Zofran  4 mg (on average taking one tablet every week). No unintentional weight loss. Continue, refill sent.  Orders:   ondansetron  (ZOFRAN ) 4 MG tablet; Take 1 tablet (4 mg total) by mouth daily as needed for nausea or vomiting.

## 2024-08-05 NOTE — Assessment & Plan Note (Signed)
 Monitored by ophthalmologist.  Continue regular follow-ups with ophthalmologist.

## 2024-08-05 NOTE — Assessment & Plan Note (Addendum)
 Chronic, stable on Nexium  40 mg BID, continue.  Continue annual follow up with [facility].  Check B12 level.  Orders:   esomeprazole  (NEXIUM ) 40 MG capsule; Take 1 capsule (40 mg total) by mouth 2 (two) times daily before a meal. 30 minutes before food

## 2024-08-05 NOTE — Assessment & Plan Note (Deleted)
 Orders:    Lipid panel; Future

## 2024-08-05 NOTE — Assessment & Plan Note (Addendum)
 Reviewed lipid panel from 01/09/24, LDL 88. Continue zetia  10 mg and simvastatin  40 mg daily. Recommend repeat pasting lipid panel before her next visit and check CMP.  Orders:   ezetimibe  (ZETIA ) 10 MG tablet; TAKE 1 TABLET(10 MG) BY MOUTH DAILY   simvastatin  (ZOCOR ) 40 MG tablet; TAKE 1 TABLET(40 MG) BY MOUTH DAILY AT 6 PM   Lipid panel; Future   Comprehensive metabolic panel with GFR; Future

## 2024-08-05 NOTE — Patient Instructions (Addendum)
-   You are due for shingles, tetanus. Please discuss this with your pharmacy.  - Recommend follow up in 6 months, fasting labs couple of days before your appointment.  - Please reach out to your dermatologist and to your gastroenterologist for a follow up visit.

## 2024-08-05 NOTE — Assessment & Plan Note (Addendum)
 Chronic, stable and doing well on Effexor  37.5 mg 2 tables in AL and one tablet in PM. Continue.  Orders:   venlafaxine  (EFFEXOR ) 37.5 MG tablet; TAKE 2 TABLETS BY MOUTH EVERY MORNING AND 1 TABLET EVERY EVENING

## 2024-08-05 NOTE — Progress Notes (Signed)
 Established Patient Office Visit TOC from Dr. Hope    Subjective  Patient ID: [name], female    DOB: 04/04/1946  Age: 78 y.o. MRN: [id number]  Chief Complaint  Patient presents with   Establish Care   Skin Problem    Discussed the use of AI scribe software for clinical note transcription with the patient, who gave verbal consent to proceed.  History of Present Illness Katie [name] is a 78 year old female who presents to establish care and management of chronic medical conditions.    - Chronic GERD, pancreatic duct dilation, complex biliary history:  She follows up with [facility] (PA Rocky Forster). Her last visit was on 07/16/23 and is due for her annual visit.   She takes ursodiol  250 mg, 3 tablets daily.  She takes Nexium  40 mg BID.  She experiences occasional back pain, which she associates with past bile duct issues, but notes that it usually resolves on its own. She also uses Zofran  as needed for nausea, approximately once a week.   - Acquired hypothyroidism: She has a history of hypothyroidism diagnosed last year and is on levothyroxine  50 mcg daily, which she takes early in the morning.   - She experiences night sweats every night. No fever, unintentional weight loss, or changes in appetite. Her recent lab work showed normal hemoglobin, white blood cell count, and electrolytes. She does not have diabetes, as confirmed by her A1c levels.  - She takes several other medications including Prolia injections twice a year.   - She takes Zetia  and simvastatin  for cholesterol. Follows up with cardiology for having h/o CAD. She is on propranolol  40 mg twice daily, Imdur  15 mg (1/2 tab of 30 mg daily).   - Her mood has been stable on Effexor  37.5 mg (2 in AM and one in PM) for mood, which she takes two tablets in the morning and one in the evening.  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 She is involved in  church activities and has a large family. She enjoys a busy lifestyle and expresses gratitude for her health and ability to remain active.  ROS As per HPI    Objective:     BP 108/70 (BP Location: Right Arm, Patient Position: Sitting, Cuff Size: Normal)   Pulse (!) 56   Temp 98.6 F (37 C) (Oral)   Ht 5' 4 (1.626 m)   Wt 117 lb 3.2 oz (53.2 kg)   SpO2 98%   BMI 20.12 kg/m      08/05/2024    3:17 PM 01/09/2024    9:04 AM 12/26/2023    9:41 AM  Depression screen PHQ 2/9  Decreased Interest 0 0 0  Down, Depressed, Hopeless 0 0 0  PHQ - 2 Score 0 0 0  Altered sleeping 0 0 0  Tired, decreased energy 0 1 0  Change in appetite 0 0 0  Feeling bad or failure about yourself  0 0 0  Trouble concentrating 0 0 0  Moving slowly or fidgety/restless 0 0 0  Suicidal thoughts 0 0 0  PHQ-9 Score 0 1  0   Difficult doing work/chores Not difficult at all Not difficult at all Not difficult at all     Data saved with a previous flowsheet row definition      08/05/2024    3:17 PM 01/09/2024    9:07  AM 07/14/2023    9:46 AM 10/30/2022    1:11 PM  GAD 7 : Generalized Anxiety Score  Nervous, Anxious, on Edge 0 1 0 0  Control/stop worrying 0 0 0 0  Worry too much - different things 0 0 0 0  Trouble relaxing 0 1 0 0  Restless 0 0 0 0  Easily annoyed or irritable 0 1 0 0  Afraid - awful might happen 0 0 0 0  Total GAD 7 Score 0 3 0 0  Anxiety Difficulty Not difficult at all Not difficult at all Not difficult at all Not difficult at all      08/05/2024    3:17 PM 01/09/2024    9:04 AM 12/26/2023    9:41 AM  Depression screen PHQ 2/9  Decreased Interest 0 0 0  Down, Depressed, Hopeless 0 0 0  PHQ - 2 Score 0 0 0  Altered sleeping 0 0 0  Tired, decreased energy 0 1 0  Change in appetite 0 0 0  Feeling bad or failure about yourself  0 0 0  Trouble concentrating 0 0 0  Moving slowly or fidgety/restless 0 0 0  Suicidal thoughts 0 0 0  PHQ-9 Score 0 1  0   Difficult doing work/chores  Not difficult at all Not difficult at all Not difficult at all     Data saved with a previous flowsheet row definition      08/05/2024    3:17 PM 01/09/2024    9:07 AM 07/14/2023    9:46 AM 10/30/2022    1:11 PM  GAD 7 : Generalized Anxiety Score  Nervous, Anxious, on Edge 0 1 0 0  Control/stop worrying 0 0 0 0  Worry too much - different things 0 0 0 0  Trouble relaxing 0 1 0 0  Restless 0 0 0 0  Easily annoyed or irritable 0 1 0 0  Afraid - awful might happen 0 0 0 0  Total GAD 7 Score 0 3 0 0  Anxiety Difficulty Not difficult at all Not difficult at all Not difficult at all Not difficult at all   SDOH Screenings   Food Insecurity: No Food Insecurity (12/26/2023)  Housing: Low Risk  (12/26/2023)  Transportation Needs: No Transportation Needs (12/26/2023)  Utilities: Not At Risk (12/26/2023)  Alcohol Screen: Low Risk  (12/26/2023)  Depression (PHQ2-9): Low Risk  (08/05/2024)  Financial Resource Strain: Low Risk  (12/26/2023)  Physical Activity: Inactive (12/26/2023)  Social Connections: Socially Integrated (12/26/2023)  Stress: No Stress Concern Present (12/26/2023)  Tobacco Use: Low Risk  (08/05/2024)  Health Literacy: Adequate Health Literacy (12/26/2023)     Physical Exam Constitutional:      General: She is not in acute distress. HENT:     Head: Normocephalic and atraumatic.     Right Ear: Tympanic membrane normal.     Left Ear: Tympanic membrane normal.     Mouth/Throat:     Mouth: Mucous membranes are moist.  Cardiovascular:     Rate and Rhythm: Normal rate.  Pulmonary:     Effort: Pulmonary effort is normal.     Breath sounds: Normal breath sounds.  Abdominal:     Palpations: Abdomen is soft.     Tenderness: There is no abdominal tenderness. There is no guarding.  Musculoskeletal:     Cervical back: Neck supple. No rigidity.     Right lower leg: No edema.     Left lower leg: No edema.  Lymphadenopathy:  Cervical: No cervical adenopathy.  Skin:    General:  Skin is warm.     Findings: Lesion (right lower leg: scab over lesion that was treated about a month ago, left upper back: healing red erythematous lesion, defer to media from today's appointment) present.  Neurological:     Mental Status: She is alert and oriented to person, place, and time.  Psychiatric:        Mood and Affect: Mood normal.           No results found for any visits on 08/05/24.  The 10-year ASCVD risk score (Arnett DK, et al., 2019) is: 19.6%     Assessment & Plan:  Patient is a pleasant 78 year old female presenting for chronic medication management.   She has been noticing intermittent night sweats. She will keep a symptom journal for further evaluation into this. No unintentional weight loss, fever, electrolytes abnormality on recent lab.   I also counseled patient to reach out to dermatologist to follow up on skin lesions on right lower leg.  Assessment & Plan Gastroesophageal reflux disease without esophagitis Chronic, stable on Nexium  40 mg BID, continue.  Continue annual follow up with [facility].  Check B12 level.  Orders:   esomeprazole  (NEXIUM ) 40 MG capsule; Take 1 capsule (40 mg total) by mouth 2 (two) times daily before a meal. 30 minutes before food  Mixed hyperlipidemia Reviewed lipid panel from 01/09/24, LDL 88. Continue zetia  10 mg and simvastatin  40 mg daily. Recommend repeat pasting lipid panel before her next visit and check CMP.  Orders:   ezetimibe  (ZETIA ) 10 MG tablet; TAKE 1 TABLET(10 MG) BY MOUTH DAILY   simvastatin  (ZOCOR ) 40 MG tablet; TAKE 1 TABLET(40 MG) BY MOUTH DAILY AT 6 PM   Lipid panel; Future   Comprehensive metabolic panel with GFR; Future  History of gallstones Symptoms has been stable on Ursodiol  250 mg (2 tab in AM), one tablet in PM. Continue.  Also recommend continue f/u with [facility], last visit was in 06/2023, she is due for annual follow up as she has a h/o dilated pancreatic duct as well which is being closely  monitored by GI. Orders:   ursodiol  (ACTIGALL ) 250 MG tablet; Take one three times a day  Mood disorder Chronic, stable and doing well on Effexor  37.5 mg 2 tables in AL and one tablet in PM. Continue.  Orders:   venlafaxine  (EFFEXOR ) 37.5 MG tablet; TAKE 2 TABLETS BY MOUTH EVERY MORNING AND 1 TABLET EVERY EVENING  Acquired hypothyroidism Chronic, TSH has been stable since starting synthroid  50 mcg in an empty stomach, continue.  Check TSH with her future labs.  Orders:   TSH; Future   levothyroxine  (SYNTHROID ) 50 MCG tablet; Take 1 tab daily on an empty stomach. Wait 30 mins to take anything else or eat.  Other iron deficiency anemia Previous history of iron deficiency anemia, previously treated with oral iron supplement. Patient is no longer taking iron supplement. CBC from 07/12/24 showed normal hemoglobin with mildly low RBC. Repeat CBC with her labs.  Orders:   CBC with Differential/Platelet; Future  Nausea Intermittent managed with prn Zofran  4 mg (on average taking one tablet every week). No unintentional weight loss. Continue, refill sent.  Orders:   ondansetron  (ZOFRAN ) 4 MG tablet; Take 1 tablet (4 mg total) by mouth daily as needed for nausea or vomiting.  Screening mammogram for breast cancer Screening mammogram ordered. Patient will call [facility] breast center to schedule for an  appointment.  Orders:   MM 3D SCREENING MAMMOGRAM BILATERAL BREAST; Future  Medication management On chronic Nexium , may contribute to low B12, check B12.  Orders:   B12; Future  Encounter for screening examination for intermediate hyperglycemia and diabetes mellitus Check A1c.  Orders:   HgB A1c; Future  Macular degeneration of both eyes, unspecified type Monitored by ophthalmologist.  Continue regular follow-ups with ophthalmologist.    I personally spent a total of 45 minutes in the care of the patient today including preparing to see the patient, getting/reviewing separately  obtained history, performing a medically appropriate exam/evaluation, counseling and educating, placing orders, documenting clinical information in the EHR, and coordinating care.   Return in about 6 months (around 02/02/2025) for chronic follow up, labs 2 days before appointment.   Luke Shade, MD

## 2024-08-05 NOTE — Assessment & Plan Note (Addendum)
 Previous history of iron deficiency anemia, previously treated with oral iron supplement. Patient is no longer taking iron supplement. CBC from 07/12/24 showed normal hemoglobin with mildly low RBC. Repeat CBC with her labs.  Orders:   CBC with Differential/Platelet; Future

## 2024-08-05 NOTE — Assessment & Plan Note (Addendum)
 On chronic Nexium , may contribute to low B12, check B12.  Orders:   B12; Future

## 2024-08-05 NOTE — Assessment & Plan Note (Addendum)
 Check A1c  Orders:   HgB A1c; Future

## 2024-08-05 NOTE — Assessment & Plan Note (Addendum)
 Chronic, TSH has been stable since starting synthroid  50 mcg in an empty stomach, continue.  Check TSH with her future labs.  Orders:   TSH; Future   levothyroxine  (SYNTHROID ) 50 MCG tablet; Take 1 tab daily on an empty stomach. Wait 30 mins to take anything else or eat.

## 2024-08-05 NOTE — Assessment & Plan Note (Addendum)
 Symptoms has been stable on Ursodiol  250 mg (2 tab in AM), one tablet in PM. Continue.  Also recommend continue f/u with [facility], last visit was in 06/2023, she is due for annual follow up as she has a h/o dilated pancreatic duct as well which is being closely monitored by GI. Orders:   ursodiol  (ACTIGALL ) 250 MG tablet; Take one three times a day

## 2024-08-24 DIAGNOSIS — Z85828 Personal history of other malignant neoplasm of skin: Secondary | ICD-10-CM | POA: Diagnosis not present

## 2024-08-24 DIAGNOSIS — L905 Scar conditions and fibrosis of skin: Secondary | ICD-10-CM | POA: Diagnosis not present

## 2024-08-25 ENCOUNTER — Ambulatory Visit

## 2024-09-03 ENCOUNTER — Ambulatory Visit

## 2024-09-24 ENCOUNTER — Ambulatory Visit: Admission: RE | Admit: 2024-09-24 | Discharge: 2024-09-24 | Disposition: A | Source: Ambulatory Visit

## 2024-09-24 DIAGNOSIS — Z1231 Encounter for screening mammogram for malignant neoplasm of breast: Secondary | ICD-10-CM

## 2024-09-27 ENCOUNTER — Ambulatory Visit: Attending: Student

## 2024-09-27 DIAGNOSIS — R002 Palpitations: Secondary | ICD-10-CM | POA: Diagnosis not present

## 2024-09-27 DIAGNOSIS — I340 Nonrheumatic mitral (valve) insufficiency: Secondary | ICD-10-CM

## 2024-09-27 DIAGNOSIS — I5030 Unspecified diastolic (congestive) heart failure: Secondary | ICD-10-CM | POA: Diagnosis not present

## 2024-09-27 DIAGNOSIS — I081 Rheumatic disorders of both mitral and tricuspid valves: Secondary | ICD-10-CM | POA: Diagnosis not present

## 2024-09-27 LAB — ECHOCARDIOGRAM COMPLETE
AR max vel: 2.01 cm2
AV Area VTI: 1.95 cm2
AV Area mean vel: 1.56 cm2
AV Mean grad: 6 mmHg
AV Peak grad: 10.8 mmHg
Ao pk vel: 1.64 m/s
Area-P 1/2: 2.91 cm2
S' Lateral: 2.7 cm

## 2024-09-28 NOTE — Progress Notes (Signed)
 Last read by [name] at 2:32PM on 09/28/2024.

## 2024-12-28 ENCOUNTER — Ambulatory Visit

## 2025-02-04 ENCOUNTER — Ambulatory Visit
